# Patient Record
Sex: Male | Born: 1935 | Hispanic: No | Smoking: Never smoker
Health system: Southern US, Community
[De-identification: ages and names within clinical notes are randomized; demographics above are authoritative.]

## PROBLEM LIST (undated history)

## (undated) DIAGNOSIS — Z950 Presence of cardiac pacemaker: Secondary | ICD-10-CM

## (undated) DIAGNOSIS — I739 Peripheral vascular disease, unspecified: Secondary | ICD-10-CM

## (undated) DIAGNOSIS — I509 Heart failure, unspecified: Secondary | ICD-10-CM

## (undated) DIAGNOSIS — I4891 Unspecified atrial fibrillation: Secondary | ICD-10-CM

## (undated) DIAGNOSIS — E785 Hyperlipidemia, unspecified: Secondary | ICD-10-CM

## (undated) DIAGNOSIS — E079 Disorder of thyroid, unspecified: Secondary | ICD-10-CM

## (undated) DIAGNOSIS — K56609 Unspecified intestinal obstruction, unspecified as to partial versus complete obstruction: Secondary | ICD-10-CM

## (undated) HISTORY — DX: Unspecified atrial fibrillation: I48.91

## (undated) HISTORY — PX: COLONOSCOPY: SHX174

## (undated) HISTORY — PX: APPENDECTOMY: SHX54

## (undated) HISTORY — DX: Disorder of thyroid, unspecified: E07.9

## (undated) HISTORY — DX: Peripheral vascular disease, unspecified: I73.9

## (undated) HISTORY — PX: CHOLECYSTECTOMY: SHX55

## (undated) HISTORY — DX: Unspecified intestinal obstruction, unspecified as to partial versus complete obstruction: K56.609

## (undated) HISTORY — DX: Hyperlipidemia, unspecified: E78.5

## (undated) HISTORY — PX: PROSTATE SURGERY: SHX751

---

## 2008-01-05 ENCOUNTER — Ambulatory Visit: Payer: Self-pay | Admitting: Unknown Physician Specialty

## 2008-09-12 ENCOUNTER — Ambulatory Visit: Payer: Self-pay | Admitting: Internal Medicine

## 2008-09-13 HISTORY — PX: INSERT / REPLACE / REMOVE PACEMAKER: SUR710

## 2008-12-20 ENCOUNTER — Ambulatory Visit: Payer: Self-pay | Admitting: Internal Medicine

## 2009-01-04 ENCOUNTER — Ambulatory Visit: Payer: Self-pay | Admitting: Surgery

## 2009-05-20 ENCOUNTER — Emergency Department: Payer: Self-pay | Admitting: Emergency Medicine

## 2010-05-03 ENCOUNTER — Ambulatory Visit: Payer: Self-pay | Admitting: Internal Medicine

## 2011-12-02 ENCOUNTER — Encounter: Payer: Self-pay | Admitting: Cardiovascular Disease

## 2011-12-02 ENCOUNTER — Ambulatory Visit (INDEPENDENT_AMBULATORY_CARE_PROVIDER_SITE_OTHER): Payer: Medicare Other | Admitting: Cardiovascular Disease

## 2011-12-02 VITALS — BP 130/70 | HR 74 | Ht 75.0 in | Wt 168.5 lb

## 2011-12-02 DIAGNOSIS — M79604 Pain in right leg: Secondary | ICD-10-CM | POA: Insufficient documentation

## 2011-12-02 DIAGNOSIS — E785 Hyperlipidemia, unspecified: Secondary | ICD-10-CM

## 2011-12-02 DIAGNOSIS — I495 Sick sinus syndrome: Secondary | ICD-10-CM

## 2011-12-02 DIAGNOSIS — I739 Peripheral vascular disease, unspecified: Secondary | ICD-10-CM

## 2011-12-02 DIAGNOSIS — M79609 Pain in unspecified limb: Secondary | ICD-10-CM

## 2011-12-02 DIAGNOSIS — I4891 Unspecified atrial fibrillation: Secondary | ICD-10-CM | POA: Insufficient documentation

## 2011-12-02 NOTE — Assessment & Plan Note (Signed)
Rate relatively well controlled on metoprolol. No changes to his medications. Pacemaker in place

## 2011-12-02 NOTE — Assessment & Plan Note (Signed)
Details of pacemaker placement or uncertain. Notes indicate pauses, atrial fibrillation. He feels well with no complaints. Pacemaker checked by Dr. Juel Burrow.

## 2011-12-02 NOTE — Patient Instructions (Addendum)
You are doing well.  Try aleve (naproxen) twice a day as  Needed for leg pain Ok to do a trial off crestor Ok to stop neurontin if this is not helping leg pain  Please call us if you have new issues that need to be addressed before your next appt.

## 2011-12-02 NOTE — Assessment & Plan Note (Signed)
Right leg pain likely secondary to orthopedic related issue. We have suggested he try NSAIDs such as Aleve when necessary for severe pain. If symptoms get worse, he could see orthopedics.

## 2011-12-02 NOTE — Progress Notes (Signed)
Patient ID: Johnny Norman, male    DOB: 1925-11-04, 78 y.o.   MRN: [id number]  HPI Comments: Johnny Norman is a very pleasant 78 year old gentleman with history of sick sinus syndrome, pacemaker placed by Dr. Juel Burrow in 2010 with indicating walls is who presents by self referral for right leg pain and possible claudication.  He had ABIs done through outside office showing ABI 1.27 on the right, 1.25 on the left read as normal. Suggestion was made that he had neuropathic pain. He was started on Neurontin 3 times a day with no improvement of his symptoms.  He was referred to Dr. Evette Cristal who felt his pain was orthopedic related. Reports that his pain has waxed and waned over the past year. He describes it as an ache, low-grade, diffuse. He is unable to pinpoint the location. By decreasing the dose of his Crestor, this does not seem to have helped his pain. He does have more pain when he sleeps on his right hip, less pain on the left hip. No significant symptoms with exertion such as walking.  EKG shows atrial fibrillation with right bundle branch block, left anterior fascicular block, rate 86 per minute  When asked about his cardiac history, he denies any significant coronary artery disease. He has never had a stress test or catheterization by his report. He was told by a physician in the past that he may have had a small heart attack but details are unavailable)  He did have a Holter monitor in 2010 that showed periods of tachy arrhythmia. He was started on metoprolol at that time with improvement of his symptoms   Outpatient Encounter Prescriptions as of 12/02/2011  Medication Sig Dispense Refill  . aspirin 81 MG tablet Take 81 mg by mouth daily.      . cholecalciferol (VITAMIN D) 1000 UNITS tablet Take 1,000 Units by mouth daily.      . fish oil-omega-3 fatty acids 1000 MG capsule Take 1 g by mouth 3 (three) times daily.      . Flaxseed, Linseed, (FLAXSEED OIL) 1000 MG CAPS Take 1,000 mg by mouth  daily.      Marland Kitchen gabapentin (NEURONTIN) 100 MG capsule Take 100 mg by mouth 3 (three) times daily.      Marland Kitchen levothyroxine (LEVOTHROID) 88 MCG tablet Take 88 mcg by mouth daily.      . metoprolol succinate (TOPROL-XL) 25 MG 24 hr tablet Take 25 mg by mouth daily.      . mirabegron ER (MYRBETRIQ) 25 MG TB24 Take 25 mg by mouth daily.      . Multiple Vitamin (MULTIVITAMIN) tablet Take 1 tablet by mouth daily.      . rosuvastatin (CRESTOR) 10 MG tablet 5 mg. 5 mg every other day.      . warfarin (COUMADIN) 5 MG tablet Take 5 mg by mouth daily.         Review of Systems  Constitutional: Negative.   HENT: Negative.   Eyes: Negative.   Respiratory: Negative.   Cardiovascular: Negative.   Gastrointestinal: Negative.   Musculoskeletal: Negative.   Skin: Negative.   Neurological: Negative.   Hematological: Negative.   Psychiatric/Behavioral: Negative.   All other systems reviewed and are negative.    BP 130/70  Pulse 74  Ht 6\' 3"  (1.905 m)  Wt 168 lb 8 oz (76.431 kg)  BMI 21.06 kg/m2  Physical Exam  Nursing note and vitals reviewed. Constitutional: He is oriented to person, place, and time. He appears well-developed and  well-nourished.  HENT:  Head: Normocephalic.  Nose: Nose normal.  Mouth/Throat: Oropharynx is clear and moist.  Eyes: Conjunctivae normal are normal. Pupils are equal, round, and reactive to light.  Neck: Normal range of motion. Neck supple. No JVD present.  Cardiovascular: Normal rate, regular rhythm, S1 normal, S2 normal, normal heart sounds and intact distal pulses.  Exam reveals no gallop and no friction rub.   No murmur heard. Pulmonary/Chest: Effort normal and breath sounds normal. No respiratory distress. He has no wheezes. He has no rales. He exhibits no tenderness.  Abdominal: Soft. Bowel sounds are normal. He exhibits no distension. There is no tenderness.  Musculoskeletal: Normal range of motion. He exhibits no edema and no tenderness.  Lymphadenopathy:     He has no cervical adenopathy.  Neurological: He is alert and oriented to person, place, and time. Coordination normal.  Skin: Skin is warm and dry. No rash noted. No erythema.  Psychiatric: He has a normal mood and affect. His behavior is normal. Judgment and thought content normal.           Assessment and Plan

## 2011-12-02 NOTE — Assessment & Plan Note (Signed)
He'll do a trial hold of his Crestor to see if this helps his right leg pain. If no improvement of his symptoms, we will restart the Crestor.

## 2011-12-29 DIAGNOSIS — R972 Elevated prostate specific antigen [PSA]: Secondary | ICD-10-CM | POA: Insufficient documentation

## 2011-12-29 DIAGNOSIS — N62 Hypertrophy of breast: Secondary | ICD-10-CM | POA: Insufficient documentation

## 2011-12-29 DIAGNOSIS — IMO0002 Reserved for concepts with insufficient information to code with codable children: Secondary | ICD-10-CM | POA: Insufficient documentation

## 2011-12-29 DIAGNOSIS — N529 Male erectile dysfunction, unspecified: Secondary | ICD-10-CM | POA: Insufficient documentation

## 2011-12-29 DIAGNOSIS — N138 Other obstructive and reflux uropathy: Secondary | ICD-10-CM | POA: Insufficient documentation

## 2011-12-29 DIAGNOSIS — R350 Frequency of micturition: Secondary | ICD-10-CM | POA: Insufficient documentation

## 2011-12-30 ENCOUNTER — Encounter: Payer: Self-pay | Admitting: Cardiovascular Disease

## 2011-12-30 DIAGNOSIS — R339 Retention of urine, unspecified: Secondary | ICD-10-CM | POA: Insufficient documentation

## 2011-12-31 ENCOUNTER — Telehealth: Payer: Self-pay

## 2011-12-31 NOTE — Telephone Encounter (Signed)
Error

## 2012-02-27 ENCOUNTER — Ambulatory Visit: Payer: Self-pay | Admitting: Chiropractor

## 2012-03-02 ENCOUNTER — Ambulatory Visit: Payer: Self-pay | Admitting: Internal Medicine

## 2012-03-13 ENCOUNTER — Other Ambulatory Visit: Payer: Self-pay

## 2012-09-01 ENCOUNTER — Other Ambulatory Visit: Payer: Self-pay

## 2012-09-07 ENCOUNTER — Encounter: Payer: Self-pay | Admitting: Cardiovascular Disease

## 2012-09-07 ENCOUNTER — Ambulatory Visit (INDEPENDENT_AMBULATORY_CARE_PROVIDER_SITE_OTHER): Payer: Medicare PPO | Admitting: Cardiovascular Disease

## 2012-09-07 VITALS — BP 120/78 | HR 74 | Ht 76.0 in | Wt 168.5 lb

## 2012-09-07 DIAGNOSIS — I495 Sick sinus syndrome: Secondary | ICD-10-CM

## 2012-09-07 DIAGNOSIS — M79609 Pain in unspecified limb: Secondary | ICD-10-CM

## 2012-09-07 DIAGNOSIS — E785 Hyperlipidemia, unspecified: Secondary | ICD-10-CM

## 2012-09-07 DIAGNOSIS — I4891 Unspecified atrial fibrillation: Secondary | ICD-10-CM

## 2012-09-07 DIAGNOSIS — M79604 Pain in right leg: Secondary | ICD-10-CM

## 2012-09-07 NOTE — Assessment & Plan Note (Signed)
Continues to have chronic leg pain, likely orthopedic

## 2012-09-07 NOTE — Progress Notes (Signed)
Patient ID: Johnny Norman, male    DOB: 31-Mar-1925, 79 y.o.   MRN: [id number]  HPI Comments: Johnny Norman is a very pleasant 79 year old gentleman with history of sick sinus syndrome, pacemaker placed by Dr. Juel Burrow in 2010 with indicating walls is who presents for routine followup.  Notes indicate he had recent treadmill stress test which showed no EKG changes concerning for ischemia.  Recent echocardiogram also done by Dr. Harl Bowie showed no significant valvular pathology, good ejection fraction  Overall he reports he doing well. He does have rare episodes of dizziness but none recently. This may have been positional. He continues to have leg aching. He held his Crestor in the past and symptoms did not resolve. Now he takes Lipitor.  Normal ABIs in the past  Previously seen by Dr. Evette Cristal who felt his leg pain was orthopedic related.  EKG shows atrial fibrillation with right bundle branch block, left anterior fascicular block, rate 74 per minute, left axis deviation  He did have a Holter monitor in 2010 that showed periods of tachy arrhythmia. He was started on metoprolol at that time with improvement of his symptoms   Outpatient Encounter Prescriptions as of 09/07/2012  Medication Sig Dispense Refill  . aspirin 81 MG tablet Take 81 mg by mouth daily.      Marland Kitchen atorvastatin (LIPITOR) 10 MG tablet Take 10 mg by mouth daily.      . cholecalciferol (VITAMIN D) 1000 UNITS tablet Take 1,000 Units by mouth daily.      . fish oil-omega-3 fatty acids 1000 MG capsule Take 1 g by mouth 3 (three) times daily.      . Flaxseed, Linseed, (FLAXSEED OIL) 1000 MG CAPS Take 1,000 mg by mouth daily.      Marland Kitchen levothyroxine (LEVOTHROID) 88 MCG tablet Take 88 mcg by mouth daily.      . metoprolol succinate (TOPROL-XL) 25 MG 24 hr tablet Take 25 mg by mouth daily.      . mirabegron ER (MYRBETRIQ) 25 MG TB24 Take 25 mg by mouth daily.      . Multiple Vitamin (MULTIVITAMIN) tablet Take 1 tablet by mouth daily.      Marland Kitchen  warfarin (COUMADIN) 5 MG tablet Take 5 mg by mouth daily.      . [DISCONTINUED] gabapentin (NEURONTIN) 100 MG capsule Take 100 mg by mouth 3 (three) times daily.      . [DISCONTINUED] rosuvastatin (CRESTOR) 10 MG tablet 5 mg. 5 mg every other day.       No facility-administered encounter medications on file as of 09/07/2012.     Review of Systems  Constitutional: Negative.   HENT: Negative.   Eyes: Negative.   Respiratory: Negative.   Cardiovascular: Negative.   Gastrointestinal: Negative.   Musculoskeletal: Positive for myalgias.  Skin: Negative.   Neurological: Negative.   Psychiatric/Behavioral: Negative.   All other systems reviewed and are negative.    BP 120/78  Ht 6\' 4"  (1.93 m)  Wt 168 lb 8 oz (76.431 kg)  BMI 20.52 kg/m2  Physical Exam  Nursing note and vitals reviewed. Constitutional: He is oriented to person, place, and time. He appears well-developed and well-nourished.  HENT:  Head: Normocephalic.  Nose: Nose normal.  Mouth/Throat: Oropharynx is clear and moist.  Eyes: Conjunctivae are normal. Pupils are equal, round, and reactive to light.  Neck: Normal range of motion. Neck supple. No JVD present.  Cardiovascular: Normal rate, regular rhythm, S1 normal, S2 normal, normal heart sounds and intact distal  pulses.  Exam reveals no gallop and no friction rub.   No murmur heard. Pulmonary/Chest: Effort normal and breath sounds normal. No respiratory distress. He has no wheezes. He has no rales. He exhibits no tenderness.  Abdominal: Soft. Bowel sounds are normal. He exhibits no distension. There is no tenderness.  Musculoskeletal: Normal range of motion. He exhibits no edema and no tenderness.  Lymphadenopathy:    He has no cervical adenopathy.  Neurological: He is alert and oriented to person, place, and time. Coordination normal.  Skin: Skin is warm and dry. No rash noted. No erythema.  Psychiatric: He has a normal mood and affect. His behavior is normal.  Judgment and thought content normal.      Assessment and Plan

## 2012-09-07 NOTE — Assessment & Plan Note (Signed)
Encouraged him to stay on his Lipitor 

## 2012-09-07 NOTE — Patient Instructions (Addendum)
You are doing well. No medication changes were made.  Please come in to the office in September for cholesterol check, fasting  Please call us if you have new issues that need to be addressed before your next appt.  Your physician wants you to follow-up in: 12 months.  You will receive a reminder letter in the mail two months in advance. If you don't receive a letter, please call our office to schedule the follow-up appointment.

## 2012-09-07 NOTE — Assessment & Plan Note (Signed)
History of pacemaker placement, followed by Dr. Harl Bowie

## 2012-10-12 ENCOUNTER — Ambulatory Visit (INDEPENDENT_AMBULATORY_CARE_PROVIDER_SITE_OTHER): Payer: Medicare PPO | Admitting: *Deleted

## 2012-10-12 DIAGNOSIS — E785 Hyperlipidemia, unspecified: Secondary | ICD-10-CM

## 2012-10-13 LAB — HEPATIC FUNCTION PANEL
Albumin: 4.2 g/dL (ref 3.5–4.7)
Alkaline Phosphatase: 62 IU/L (ref 39–117)
Bilirubin, Direct: 0.18 mg/dL (ref 0.00–0.40)
Total Protein: 6.2 g/dL (ref 6.0–8.5)

## 2012-10-13 LAB — LIPID PANEL: Chol/HDL Ratio: 2.7 ratio units (ref 0.0–5.0)

## 2012-11-15 ENCOUNTER — Telehealth: Payer: Self-pay

## 2012-11-15 NOTE — Telephone Encounter (Signed)
Message copied by Marilynne Halsted on Mon Nov 15, 2012  3:58 PM ------      Message from: Antonieta Iba      Created: Sun Nov 14, 2012  6:48 PM       Cholesterol good, 159      Stay on current meds ------

## 2012-11-15 NOTE — Telephone Encounter (Signed)
Spoke w/ pt.  He is aware of results.  

## 2012-12-02 ENCOUNTER — Other Ambulatory Visit: Payer: Self-pay

## 2013-09-07 ENCOUNTER — Ambulatory Visit (INDEPENDENT_AMBULATORY_CARE_PROVIDER_SITE_OTHER): Payer: 59 | Admitting: Cardiovascular Disease

## 2013-09-07 ENCOUNTER — Encounter: Payer: Self-pay | Admitting: Cardiovascular Disease

## 2013-09-07 VITALS — BP 110/82 | HR 75 | Ht 75.0 in | Wt 170.0 lb

## 2013-09-07 DIAGNOSIS — E785 Hyperlipidemia, unspecified: Secondary | ICD-10-CM

## 2013-09-07 DIAGNOSIS — R609 Edema, unspecified: Secondary | ICD-10-CM

## 2013-09-07 DIAGNOSIS — R269 Unspecified abnormalities of gait and mobility: Secondary | ICD-10-CM

## 2013-09-07 DIAGNOSIS — I4891 Unspecified atrial fibrillation: Secondary | ICD-10-CM

## 2013-09-07 DIAGNOSIS — I495 Sick sinus syndrome: Secondary | ICD-10-CM

## 2013-09-07 DIAGNOSIS — R2681 Unsteadiness on feet: Secondary | ICD-10-CM

## 2013-09-07 DIAGNOSIS — R600 Localized edema: Secondary | ICD-10-CM | POA: Insufficient documentation

## 2013-09-07 NOTE — Assessment & Plan Note (Signed)
Tolerating anticoagulation. Heart rate well controlled on nadolol

## 2013-09-07 NOTE — Assessment & Plan Note (Signed)
Reports he is staggering in the morning. This happens at 5 AM when he does his walking. Less likely cardiac but we suggested he hold his blocker until he returns home. He could even try one half dose of his beta blocker in the morning, one half at nighttime. Using blood pressure cuff to monitor his blood pressure to watch for orthostasis.

## 2013-09-07 NOTE — Assessment & Plan Note (Signed)
Pacemaker placed, managed by Dr. Harl BowieMassoud

## 2013-09-07 NOTE — Assessment & Plan Note (Signed)
He reports leg edema is better with compression hose. Less likely heart failure. More likely from venous insufficiency. No need for diuretics at this time as symptoms were not impressive today. Recommended he use compression hose as needed, leg elevation. Call our office if symptoms get worse

## 2013-09-07 NOTE — Progress Notes (Signed)
Patient ID: Johnny Norman, male    DOB: 1925/12/04, 80 y.o.   MRN: [id number]  HPI Comments: [name] is a very pleasant 80 year old gentleman with history of sick sinus syndrome, atrial fibrillation, pacemaker placed by Dr. Juel BurrowMasoud in 2010 with indicating walls is who presents for routine followup.  In followup today, he reports that he is exercising on a daily basis 20 or 30 minutes walking 5 days per week. Overall feels well. He has noticed some trace lower extremity edema and has started wearing compression hose. Denies having any shortness of breath or chest pain with exertion. He reports lower extremity edema is better today, with his compression hose  He is concerned about decreased energy, staggering in the morning when he gets up at 5 AM to go walking. Also was reading on the Internet and worried about heart failure  03/14/2013 lab work showing total cholesterol 148, LDL 73, HDL 49  Previous treadmill stress test which showed no EKG changes concerning for ischemia.  Recent echocardiogram also done by Dr. Harl BowieMassoud showed no significant valvular pathology, good ejection fraction  Normal ABIs in the past Previously seen by Dr. Evette CristalSankar who felt his leg pain was orthopedic related.  EKG shows paced rhythm with underlying atrial fibrillation with right bundle branch block, left anterior fascicular block, rate 75 per minute  He did have a Holter monitor in 2010 that showed periods of tachy arrhythmia. He was started on metoprolol at that time with improvement of his symptoms   Outpatient Encounter Prescriptions as of 09/07/2013  Medication Sig  . aspirin 81 MG tablet Take 81 mg by mouth daily.  Marland Kitchen. atorvastatin (LIPITOR) 10 MG tablet Take 10 mg by mouth daily.  . cholecalciferol (VITAMIN D) 1000 UNITS tablet Take 1,000 Units by mouth daily.  . fish oil-omega-3 fatty acids 1000 MG capsule Take 1 g by mouth 3 (three) times daily.  . Flaxseed, Linseed, (FLAXSEED OIL) 1000 MG CAPS Take 1,000  mg by mouth daily.  Marland Kitchen. levothyroxine (LEVOTHROID) 88 MCG tablet Take 88 mcg by mouth daily.  . mirabegron ER (MYRBETRIQ) 25 MG TB24 Take 25 mg by mouth daily.  . Multiple Vitamin (MULTIVITAMIN) tablet Take 1 tablet by mouth daily.  . nadolol (CORGARD) 20 MG tablet Take 20 mg by mouth daily.   Marland Kitchen. warfarin (COUMADIN) 5 MG tablet Take 5 mg by mouth daily.    Review of Systems  Constitutional: Negative.   HENT: Negative.   Eyes: Negative.   Respiratory: Negative.   Cardiovascular: Negative.   Gastrointestinal: Negative.   Endocrine: Negative.   Musculoskeletal: Positive for myalgias.  Skin: Negative.   Allergic/Immunologic: Negative.   Neurological: Negative.   Hematological: Negative.   Psychiatric/Behavioral: Negative.   All other systems reviewed and are negative.   BP 110/82  Pulse 75  Ht 6\' 3"  (1.905 m)  Wt 170 lb (77.111 kg)  BMI 21.25 kg/m2  Physical Exam  Nursing note and vitals reviewed. Constitutional: He is oriented to person, place, and time. He appears well-developed and well-nourished.  HENT:  Head: Normocephalic.  Nose: Nose normal.  Mouth/Throat: Oropharynx is clear and moist.  Eyes: Conjunctivae are normal. Pupils are equal, round, and reactive to light.  Neck: Normal range of motion. Neck supple. No JVD present.  Cardiovascular: Normal rate, regular rhythm, S1 normal, S2 normal, normal heart sounds and intact distal pulses.  Exam reveals no gallop and no friction rub.   No murmur heard. Pulmonary/Chest: Effort normal and breath sounds normal. No  respiratory distress. He has no wheezes. He has no rales. He exhibits no tenderness.  Abdominal: Soft. Bowel sounds are normal. He exhibits no distension. There is no tenderness.  Musculoskeletal: Normal range of motion. He exhibits no edema and no tenderness.  Lymphadenopathy:    He has no cervical adenopathy.  Neurological: He is alert and oriented to person, place, and time. Coordination normal.  Skin: Skin is  warm and dry. No rash noted. No erythema.  Psychiatric: He has a normal mood and affect. His behavior is normal. Judgment and thought content normal.      Assessment and Plan

## 2013-09-07 NOTE — Patient Instructions (Signed)
You are doing well. No medication changes were made.  Please call us if you have new issues that need to be addressed before your next appt.  Your physician wants you to follow-up in: 12 months.  You will receive a reminder letter in the mail two months in advance. If you don't receive a letter, please call our office to schedule the follow-up appointment. 

## 2013-09-07 NOTE — Assessment & Plan Note (Signed)
Cholesterol is at goal on the current lipid regimen. No changes to the medications were made.  

## 2014-04-25 ENCOUNTER — Ambulatory Visit (INDEPENDENT_AMBULATORY_CARE_PROVIDER_SITE_OTHER): Payer: Medicare Other | Admitting: Podiatry

## 2014-04-25 ENCOUNTER — Encounter: Payer: Self-pay | Admitting: Podiatry

## 2014-04-25 VITALS — BP 126/84 | HR 86 | Resp 16

## 2014-04-25 DIAGNOSIS — L600 Ingrowing nail: Secondary | ICD-10-CM | POA: Diagnosis not present

## 2014-04-25 MED ORDER — CEPHALEXIN 500 MG PO CAPS: 500.0000 mg | ORAL_CAPSULE | Freq: Two times a day (BID) | ORAL | Status: DC

## 2014-04-25 NOTE — Progress Notes (Signed)
   Subjective:    Patient ID: Johnny Norman, male    DOB: 1925-06-14, 81 y.o.   MRN: [id number]  HPI Comments: "I have an ingrown toenail"  Patient c/o tender 1st toe right, medial border, for couple months. The area is slightly red and swollen. He did see his PCP and was advised to use an "iodine paint" on it. It does looks better today.   Toe Pain       Review of Systems  Genitourinary: Positive for difficulty urinating.  Musculoskeletal: Positive for myalgias and arthralgias.  All other systems reviewed and are negative.      Objective:   Physical Exam AAO x3, NAD DP/PT pulses palpable bilaterally, CRT less than 3 seconds Protective sensation intact with Simms Weinstein monofilament, vibratory sensation slight decreased, Achilles tendon reflex intact. On the medial aspect of the right hallux there is evidence of incurvation of the nail border tenderness palpation around the nail border. There is erythema to the medial aspect of the hallux.  There is no ascending cellulitis, fluctuance, crepitus. There is no drainage or purulence expressed at this time. However, see procedure note below.There is no tenderness on the lateral nail border. Remaining nails also with slight incurvation  However with no tenderness at this time. No open lesions or pre-ulcerative lesions are identified. No pain with calf compression, swelling, warmth, erythema.        Assessment & Plan:   81 year old male right medial hallux  Ingrown toenail with localized infection - treatment options both conservative and surgical were discussed the patient including alternatives, risks, complications. - at this time as the patient on Coumadin I performed a slant back procedure to help alleviate the patient's symptoms. At the conclusion of the procedure he did not have any resolution of symptoms and there was a small amount of purulence expressed of the nail border. Because of this recommend a partial nail  avulsion to help of infection. Patient understands risks and verbally consents. Under  Sterile conditions a total of 3 mL of a mixture of 2% lidocaine plain and 0.5% Marcaine plain was infiltrated in a hallux block fashion. Skin was then prepped in sterile fashion. Tourniquet was applied. Medial border the right hallux nail was sharply excised making sure to remove the entire offending nail border. Once the nail was removed the underlying skin was debrided and found to be intact. No further purulence was expressed. Area was irrigated. Silvadene was applied followed by dry sterile dressing.  After the dressings were applied and tourniquet was removed and there is found to be an immediate capillary refill time to the digit.  The patient was set for approximately 10 minutes after the procedure to ensure there is no bleeding for which there was not. Patient tolerated the procedure well any complications. Post procedure care was discussed the patient for which she verbally understood. Prescribed Keflex. Monitor for any signs or symptoms of worsening infection and directed to call the office immediately should any occur or go directly to the emergency room. Follow up in 1 week for nail check or sooner if any problems are to arise. Follow-up with PCP for other issues mentioned and review of systems.

## 2014-04-25 NOTE — Patient Instructions (Signed)

## 2014-05-02 ENCOUNTER — Ambulatory Visit (INDEPENDENT_AMBULATORY_CARE_PROVIDER_SITE_OTHER): Payer: Medicare Other | Admitting: Podiatry

## 2014-05-02 DIAGNOSIS — L600 Ingrowing nail: Secondary | ICD-10-CM

## 2014-05-02 NOTE — Patient Instructions (Signed)

## 2014-05-03 NOTE — Progress Notes (Signed)
Patient ID: Johnny FlakesRichard E Norman, male   DOB: [birth year], 81 y.o.   MRN: [id number]  Subjective: 81 year old male returns the office today one-week status post medial hallux partial nail avulsion secondary to infection. He states his been continuous of this with Epson salts cover with antibiotic ointment and a Band-Aid. Denies any pain associated with the area. He states that the redness is decreasing denies any red streaking. Denies any drainage or purulence. He's continuing the antibiotics. He is asking of his left big toenail can be trimmed as his ingrown. Denies any drainage or redness from the site and no specific tenderness. No other complaints at this time.  Objective: AAO 3, NAD DP/PT pulses palpable, CRT less than 3 seconds Status post medial hallux partial nail avulsion which is healing well. There is a small scab formation within the procedure site however there is no tenderness to palpation. There is no swelling erythema, ascending cellulitis. No drainage/purulence. On the left hallux nail there is evidence of incurvation of both the medial and lateral aspect of the nail border. There is no specific tenderness palpation overlying the nail borders there is no swelling erythema or drainage. No other open lesions or pre-ulcerative lesions No pain with calf compression, swelling, warmth, erythema.  Assessment: 81 year old male 1 week status post right hallux partial nail avulsion secondary to infection, resolving; left hallux ingrown toenail  Plan: -At this time recommended continue soaking in Epson salt soaks twice a day covering with antibiotic ointment and a Band-Aid during the day. Can leave the area uncovered at night. Continue this until the area has completely healed. Follow-up in 2 weeks of the area is not completely healed or sooner if there is any problems.  -Left hallux nail sharply debrided without complication/bleeding. -Monitor for any signs or symptoms of recurrence of infection  and directed to call the office if any are to occur.

## 2014-10-05 ENCOUNTER — Ambulatory Visit (INDEPENDENT_AMBULATORY_CARE_PROVIDER_SITE_OTHER): Payer: Medicare Other | Admitting: Cardiovascular Disease

## 2014-10-05 ENCOUNTER — Encounter: Payer: Self-pay | Admitting: Cardiovascular Disease

## 2014-10-05 VITALS — BP 110/80 | HR 74 | Ht 75.0 in | Wt 168.8 lb

## 2014-10-05 DIAGNOSIS — Z7189 Other specified counseling: Secondary | ICD-10-CM | POA: Diagnosis not present

## 2014-10-05 DIAGNOSIS — I495 Sick sinus syndrome: Secondary | ICD-10-CM

## 2014-10-05 DIAGNOSIS — R002 Palpitations: Secondary | ICD-10-CM | POA: Insufficient documentation

## 2014-10-05 DIAGNOSIS — I4891 Unspecified atrial fibrillation: Secondary | ICD-10-CM

## 2014-10-05 DIAGNOSIS — R600 Localized edema: Secondary | ICD-10-CM | POA: Diagnosis not present

## 2014-10-05 DIAGNOSIS — E785 Hyperlipidemia, unspecified: Secondary | ICD-10-CM | POA: Diagnosis not present

## 2014-10-05 DIAGNOSIS — Z7901 Long term (current) use of anticoagulants: Secondary | ICD-10-CM | POA: Insufficient documentation

## 2014-10-05 NOTE — Assessment & Plan Note (Signed)
Etiology of his several minute episode of palpitations is unclear If he has additional episodes, would order a 30 day monitor if nothing notable on pacemaker check

## 2014-10-05 NOTE — Assessment & Plan Note (Signed)
No significant edema on today's visit No changes to his medications

## 2014-10-05 NOTE — Progress Notes (Signed)
Patient ID: Johnny Norman, male    DOB: September 12, 1925, 81 y.o.   MRN: [id number]  HPI Comments: [name] is a very pleasant 81 year old gentleman with history of sick sinus syndrome, atrial fibrillation, pacemaker placed by Dr. Juel Burrow in 2010 with indicating walls is who presents for routine followup for atrial fibrillation.  Recent episode of strong beats at night, 9 to 10 pm.  2 to 3 minutes  Otherwise feels well.  Dr. Juel Burrow checks pacers  In followup today, he reports that he is exercising on a daily basis 20 or 30 minutes walking 5 days per week.  Overall feels well.   03/14/2013 lab work showing total cholesterol 148, LDL 73, HDL 49 Labs 04/2014: total chol  148, ldl 82  EKG shows NSR with rate 74,   Other medical history Previous treadmill stress test which showed no EKG changes concerning for ischemia.  Recent echocardiogram also done by Dr. Harl Bowie showed no significant valvular pathology, good ejection fraction  Normal ABIs in the past Previously seen by Dr. Evette Cristal who felt his leg pain was orthopedic related.  Holter monitor in 2010 that showed periods of tachy arrhythmia. He was started on metoprolol at that time with improvement of his symptoms  No Known Allergies  Current Outpatient Prescriptions on File Prior to Visit  Medication Sig Dispense Refill  . aspirin 81 MG tablet Take 81 mg by mouth daily.    Marland Kitchen atorvastatin (LIPITOR) 10 MG tablet Take 10 mg by mouth daily.    . cholecalciferol (VITAMIN D) 1000 UNITS tablet Take 1,000 Units by mouth daily.    . fish oil-omega-3 fatty acids 1000 MG capsule Take 1 g by mouth 3 (three) times daily.    . Flaxseed, Linseed, (FLAXSEED OIL) 1000 MG CAPS Take 1,000 mg by mouth daily.    Marland Kitchen levothyroxine (LEVOTHROID) 88 MCG tablet Take 88 mcg by mouth daily.    . mirabegron ER (MYRBETRIQ) 25 MG TB24 Take 25 mg by mouth daily.    . Multiple Vitamin (MULTIVITAMIN) tablet Take 1 tablet by mouth daily.    . nadolol (CORGARD) 20 MG  tablet Take 20 mg by mouth daily.     Marland Kitchen warfarin (COUMADIN) 5 MG tablet Take 5 mg by mouth daily.     No current facility-administered medications on file prior to visit.    Past Medical History  Diagnosis Date  . Claudication     leg  . A-fib   . Thyroid disease   . Hyperlipidemia     Past Surgical History  Procedure Laterality Date  . Insert / replace / remove pacemaker  09/13/2008    SSS s/p pacemaker implant; Medtronic pacemaker,.  . Colonoscopy    . Cholecystectomy    . Appendectomy    . Prostate surgery      Social History  reports that he has never smoked. He does not have any smokeless tobacco history on file. He reports that he does not drink alcohol or use illicit drugs.  Family History Family history is unknown by patient.   Review of Systems  Constitutional: Negative.   HENT: Negative.   Eyes: Negative.   Respiratory: Negative.   Cardiovascular: Negative.   Gastrointestinal: Negative.   Endocrine: Negative.   Musculoskeletal: Positive for myalgias.  Skin: Negative.   Allergic/Immunologic: Negative.   Neurological: Negative.   Hematological: Negative.   Psychiatric/Behavioral: Negative.   All other systems reviewed and are negative.   BP 110/80 mmHg  Pulse 74  Ht   (1.905 m)  Wt 168 lb 12 oz (76.544 kg)  BMI 21.09 kg/m2  Physical Exam  Constitutional: He is oriented to person, place, and time. He appears well-developed and well-nourished.  HENT:  Head: Normocephalic.  Nose: Nose normal.  Mouth/Throat: Oropharynx is clear and moist.  Eyes: Conjunctivae are normal. Pupils are equal, round, and reactive to light.  Neck: Normal range of motion. Neck supple. No JVD present.  Cardiovascular: Normal rate, regular rhythm, S1 normal, S2 normal, normal heart sounds and intact distal pulses.  Exam reveals no gallop and no friction rub.   No murmur heard. Pulmonary/Chest: Effort normal and breath sounds normal. No respiratory distress. He has no  wheezes. He has no rales. He exhibits no tenderness.  Abdominal: Soft. Bowel sounds are normal. He exhibits no distension. There is no tenderness.  Musculoskeletal: Normal range of motion. He exhibits no edema or tenderness.  Lymphadenopathy:    He has no cervical adenopathy.  Neurological: He is alert and oriented to person, place, and time. Coordination normal.  Skin: Skin is warm and dry. No rash noted. No erythema.  Psychiatric: He has a normal mood and affect. His behavior is normal. Judgment and thought content normal.      Assessment and Plan   Nursing note and vitals reviewed.

## 2014-10-05 NOTE — Assessment & Plan Note (Signed)
He has a pacemaker, followed by Dr. Harl Bowie No recent issues Suggested he talk with him about recent palpitations. If he has more episodes, could order a 30 day monitor

## 2014-10-05 NOTE — Assessment & Plan Note (Signed)
Cholesterol is at goal on the current lipid regimen. No changes to the medications were made.  

## 2014-10-05 NOTE — Patient Instructions (Signed)
You are doing well. No medication changes were made.  Please call us if you have new issues that need to be addressed before your next appt.  Your physician wants you to follow-up in: 12 months.  You will receive a reminder letter in the mail two months in advance. If you don't receive a letter, please call our office to schedule the follow-up appointment. 

## 2014-10-05 NOTE — Assessment & Plan Note (Signed)
He has converted to NSR since his last clinic visit Recommended he stay on anticoagulation and beta blocker

## 2014-10-05 NOTE — Assessment & Plan Note (Signed)
Doing well on his anticoagulation, no recent falls

## 2014-10-07 ENCOUNTER — Inpatient Hospital Stay
Admission: EM | Admit: 2014-10-07 | Discharge: 2014-10-18 | DRG: 330 | Disposition: A | Discharge status: Skilled Nursing Facility | Payer: Medicare Other | Attending: Surgery | Admitting: Surgery

## 2014-10-07 ENCOUNTER — Emergency Department: Payer: Medicare Other

## 2014-10-07 ENCOUNTER — Encounter: Payer: Self-pay | Admitting: *Deleted

## 2014-10-07 DIAGNOSIS — E785 Hyperlipidemia, unspecified: Secondary | ICD-10-CM | POA: Diagnosis present

## 2014-10-07 DIAGNOSIS — Z7901 Long term (current) use of anticoagulants: Secondary | ICD-10-CM

## 2014-10-07 DIAGNOSIS — R338 Other retention of urine: Secondary | ICD-10-CM | POA: Diagnosis present

## 2014-10-07 DIAGNOSIS — Z950 Presence of cardiac pacemaker: Secondary | ICD-10-CM

## 2014-10-07 DIAGNOSIS — Z820 Family history of epilepsy and other diseases of the nervous system: Secondary | ICD-10-CM

## 2014-10-07 DIAGNOSIS — E872 Acidosis: Secondary | ICD-10-CM | POA: Diagnosis present

## 2014-10-07 DIAGNOSIS — Z7982 Long term (current) use of aspirin: Secondary | ICD-10-CM

## 2014-10-07 DIAGNOSIS — Z4659 Encounter for fitting and adjustment of other gastrointestinal appliance and device: Secondary | ICD-10-CM

## 2014-10-07 DIAGNOSIS — E079 Disorder of thyroid, unspecified: Secondary | ICD-10-CM | POA: Diagnosis present

## 2014-10-07 DIAGNOSIS — K551 Chronic vascular disorders of intestine: Secondary | ICD-10-CM | POA: Diagnosis present

## 2014-10-07 DIAGNOSIS — I10 Essential (primary) hypertension: Secondary | ICD-10-CM | POA: Diagnosis present

## 2014-10-07 DIAGNOSIS — I509 Heart failure, unspecified: Secondary | ICD-10-CM | POA: Diagnosis present

## 2014-10-07 DIAGNOSIS — I440 Atrioventricular block, first degree: Secondary | ICD-10-CM | POA: Diagnosis present

## 2014-10-07 DIAGNOSIS — D72829 Elevated white blood cell count, unspecified: Secondary | ICD-10-CM | POA: Diagnosis present

## 2014-10-07 DIAGNOSIS — K565 Intestinal adhesions [bands], unspecified as to partial versus complete obstruction: Secondary | ICD-10-CM

## 2014-10-07 DIAGNOSIS — N401 Enlarged prostate with lower urinary tract symptoms: Secondary | ICD-10-CM | POA: Diagnosis present

## 2014-10-07 DIAGNOSIS — R110 Nausea: Secondary | ICD-10-CM

## 2014-10-07 DIAGNOSIS — I4891 Unspecified atrial fibrillation: Secondary | ICD-10-CM | POA: Diagnosis present

## 2014-10-07 DIAGNOSIS — K5669 Other intestinal obstruction: Secondary | ICD-10-CM | POA: Diagnosis present

## 2014-10-07 DIAGNOSIS — K56609 Unspecified intestinal obstruction, unspecified as to partial versus complete obstruction: Secondary | ICD-10-CM

## 2014-10-07 HISTORY — DX: Heart failure, unspecified: I50.9

## 2014-10-07 LAB — COMPREHENSIVE METABOLIC PANEL
ALBUMIN: 4.3 g/dL (ref 3.5–5.0)
ALK PHOS: 71 U/L (ref 38–126)
ALT: 21 U/L (ref 17–63)
ANION GAP: 6 (ref 5–15)
AST: 34 U/L (ref 15–41)
BILIRUBIN TOTAL: 1.3 mg/dL — AB (ref 0.3–1.2)
BUN: 20 mg/dL (ref 6–20)
CALCIUM: 9.4 mg/dL (ref 8.9–10.3)
CO2: 27 mmol/L (ref 22–32)
Chloride: 105 mmol/L (ref 101–111)
Creatinine, Ser: 1.23 mg/dL (ref 0.61–1.24)
GFR, EST AFRICAN AMERICAN: 59 mL/min — AB (ref >60)
GFR, EST NON AFRICAN AMERICAN: 51 mL/min — AB (ref >60)
GLUCOSE: 128 mg/dL — AB (ref 65–99)
Potassium: 4.1 mmol/L (ref 3.5–5.1)
Sodium: 138 mmol/L (ref 135–145)
TOTAL PROTEIN: 7 g/dL (ref 6.5–8.1)

## 2014-10-07 LAB — URINALYSIS COMPLETE WITH MICROSCOPIC (ARMC ONLY)
Bacteria, UA: NONE SEEN
Bilirubin Urine: NEGATIVE
GLUCOSE, UA: NEGATIVE mg/dL
Hgb urine dipstick: NEGATIVE
KETONES UR: NEGATIVE mg/dL
LEUKOCYTES UA: NEGATIVE
NITRITE: NEGATIVE
PROTEIN: NEGATIVE mg/dL
SPECIFIC GRAVITY, URINE: 1.019 (ref 1.005–1.030)
Squamous Epithelial / LPF: NONE SEEN
WBC UA: NONE SEEN WBC/hpf (ref 0–5)
pH: 8 (ref 5.0–8.0)

## 2014-10-07 LAB — CBC
HCT: 44.6 % (ref 40.0–52.0)
HEMOGLOBIN: 15.1 g/dL (ref 13.0–18.0)
MCH: 30.4 pg (ref 26.0–34.0)
MCHC: 33.9 g/dL (ref 32.0–36.0)
MCV: 89.9 fL (ref 80.0–100.0)
Platelets: 187 10*3/uL (ref 150–440)
RBC: 4.96 MIL/uL (ref 4.40–5.90)
RDW: 13.6 % (ref 11.5–14.5)
WBC: 8.1 10*3/uL (ref 3.8–10.6)

## 2014-10-07 LAB — PROTIME-INR
INR: 2.27
Prothrombin Time: 25.2 seconds — ABNORMAL HIGH (ref 11.4–15.0)

## 2014-10-07 LAB — LACTIC ACID, PLASMA: Lactic Acid, Venous: 2.5 mmol/L (ref 0.5–2.0)

## 2014-10-07 LAB — TROPONIN I: Troponin I: <0.03 ng/mL (ref <0.031)

## 2014-10-07 LAB — LIPASE, BLOOD: Lipase: 29 U/L (ref 22–51)

## 2014-10-07 MED ORDER — BUTAMBEN-TETRACAINE-BENZOCAINE 2-2-14 % EX AERO
1.0000 | INHALATION_SPRAY | Freq: Once | CUTANEOUS | Status: AC
  Administered 2014-10-07: 1 via TOPICAL

## 2014-10-07 MED ORDER — MENTHOL 3 MG MT LOZG
1.0000 | LOZENGE | OROMUCOSAL | Status: DC | PRN
  Administered 2014-10-08 – 2014-10-09 (×2): 3 mg via ORAL
  Filled 2014-10-07 (×3): qty 9

## 2014-10-07 MED ORDER — SODIUM CHLORIDE 0.9 % IV SOLN
INTRAVENOUS | Status: DC
  Administered 2014-10-08: 03:00:00 via INTRAVENOUS

## 2014-10-07 MED ORDER — ONDANSETRON 4 MG PO TBDP
ORAL_TABLET | ORAL | Status: AC
  Filled 2014-10-07: qty 1

## 2014-10-07 MED ORDER — ASPIRIN EC 81 MG PO TBEC
81.0000 mg | DELAYED_RELEASE_TABLET | Freq: Every day | ORAL | Status: DC
  Administered 2014-10-07 – 2014-10-10 (×4): 81 mg via ORAL
  Filled 2014-10-07 (×4): qty 1

## 2014-10-07 MED ORDER — ACETAMINOPHEN 650 MG RE SUPP
650.0000 mg | Freq: Four times a day (QID) | RECTAL | Status: DC | PRN
  Administered 2014-10-12: 650 mg via RECTAL
  Filled 2014-10-07: qty 1

## 2014-10-07 MED ORDER — LEVOTHYROXINE SODIUM 88 MCG PO TABS
88.0000 ug | ORAL_TABLET | Freq: Every day | ORAL | Status: DC
  Administered 2014-10-08 – 2014-10-10 (×3): 88 ug via ORAL
  Filled 2014-10-07 (×3): qty 1

## 2014-10-07 MED ORDER — DEXTROSE IN LACTATED RINGERS 5 % IV SOLN
INTRAVENOUS | Status: DC
  Administered 2014-10-07: 18:00:00 via INTRAVENOUS

## 2014-10-07 MED ORDER — MORPHINE SULFATE (PF) 2 MG/ML IV SOLN
2.0000 mg | INTRAVENOUS | Status: DC | PRN
  Administered 2014-10-07 (×3): 2 mg via INTRAVENOUS
  Filled 2014-10-07 (×4): qty 1

## 2014-10-07 MED ORDER — BENZOCAINE (TOPICAL) 20 % EX AERO
INHALATION_SPRAY | Freq: Four times a day (QID) | CUTANEOUS | Status: DC | PRN
  Filled 2014-10-07: qty 57

## 2014-10-07 MED ORDER — HYDROMORPHONE HCL 1 MG/ML IJ SOLN
0.5000 mg | INTRAMUSCULAR | Status: DC | PRN
  Administered 2014-10-07 – 2014-10-08 (×4): 0.5 mg via INTRAVENOUS
  Filled 2014-10-07 (×4): qty 1

## 2014-10-07 MED ORDER — ACETAMINOPHEN 325 MG PO TABS: 650.0000 mg | ORAL_TABLET | Freq: Four times a day (QID) | ORAL | Status: DC | PRN

## 2014-10-07 MED ORDER — SODIUM CHLORIDE 0.9 % IV BOLUS (SEPSIS)
500.0000 mL | Freq: Once | INTRAVENOUS | Status: AC
  Administered 2014-10-07: 500 mL via INTRAVENOUS

## 2014-10-07 MED ORDER — IOHEXOL 300 MG/ML  SOLN
100.0000 mL | Freq: Once | INTRAMUSCULAR | Status: AC | PRN
  Administered 2014-10-07: 100 mL via INTRAVENOUS

## 2014-10-07 MED ORDER — IOHEXOL 240 MG/ML SOLN
25.0000 mL | Freq: Once | INTRAMUSCULAR | Status: AC | PRN
  Administered 2014-10-07: 25 mL via ORAL

## 2014-10-07 MED ORDER — ATORVASTATIN CALCIUM 20 MG PO TABS
10.0000 mg | ORAL_TABLET | Freq: Every day | ORAL | Status: DC
  Administered 2014-10-07 – 2014-10-10 (×4): 10 mg via ORAL
  Filled 2014-10-07 (×4): qty 1

## 2014-10-07 MED ORDER — ENOXAPARIN SODIUM 30 MG/0.3ML ~~LOC~~ SOLN
30.0000 mg | SUBCUTANEOUS | Status: DC
  Administered 2014-10-07: 30 mg via SUBCUTANEOUS
  Filled 2014-10-07: qty 0.3

## 2014-10-07 MED ORDER — BUTAMBEN-TETRACAINE-BENZOCAINE 2-2-14 % EX AERO
INHALATION_SPRAY | CUTANEOUS | Status: AC
  Administered 2014-10-07: 1 via TOPICAL
  Filled 2014-10-07: qty 20

## 2014-10-07 MED ORDER — PROMETHAZINE HCL 25 MG PO TABS
12.5000 mg | ORAL_TABLET | Freq: Four times a day (QID) | ORAL | Status: DC | PRN
  Administered 2014-10-07 – 2014-10-09 (×4): 12.5 mg via ORAL
  Filled 2014-10-07 (×4): qty 1

## 2014-10-07 MED ORDER — MIRABEGRON ER 25 MG PO TB24
25.0000 mg | ORAL_TABLET | Freq: Every day | ORAL | Status: DC
  Administered 2014-10-08 – 2014-10-10 (×3): 25 mg via ORAL
  Filled 2014-10-07 (×6): qty 1

## 2014-10-07 MED ORDER — ONDANSETRON HCL 4 MG/2ML IJ SOLN
4.0000 mg | Freq: Once | INTRAMUSCULAR | Status: AC
  Administered 2014-10-07: 4 mg via INTRAVENOUS
  Filled 2014-10-07: qty 2

## 2014-10-07 MED ORDER — ONDANSETRON 4 MG PO TBDP
4.0000 mg | ORAL_TABLET | Freq: Once | ORAL | Status: AC | PRN
  Administered 2014-10-07: 4 mg via ORAL

## 2014-10-07 MED ORDER — NADOLOL 20 MG PO TABS
20.0000 mg | ORAL_TABLET | Freq: Every day | ORAL | Status: DC
  Administered 2014-10-07 – 2014-10-10 (×4): 20 mg via ORAL
  Filled 2014-10-07 (×4): qty 1

## 2014-10-07 NOTE — H&P (Signed)
Johnny Norman is an 81 y.o. male.    Chief Complaint:   abdominal pain and vomiting.  HPI:   This is an active 81 year old male with a history of atrial fibrillation and sick sinus syndrome for which she is been placed on Coumadin and who is had multiple previous abdominal operations including an appendectomy in the distant past as well as a cholecystectomy done laparoscopically. He awoke this morning at approximately 2:30 in the morning with rather vague diffuse abdominal pain with mild distention followed by some intermittent emesis and nausea. He came to the emergency room because he persisted with abdominal pain and workup is concerning for adhesive small bowel obstruction. CT scan was performed demonstrating findings consistent with small bowel obstruction and as such surgical services were asked to admit.  The patient denies any prior history of bowel obstruction or bowel obstruction surgery. No problems eating. Last bowel movement was 2 days ago small amount of flatus today. As a gastric tube was placed by nursing staff and KUB confirms it to be within the stomach this point in time. He follows with Dr.Gollan of cardiology.  Past Medical History  Diagnosis Date  . Claudication     leg  . A-fib   . Thyroid disease   . Hyperlipidemia   . CHF (congestive heart failure)     Past Surgical History  Procedure Laterality Date  . Insert / replace / remove pacemaker  09/13/2008    SSS s/p pacemaker implant; Medtronic pacemaker,.  . Colonoscopy    . Cholecystectomy    . Appendectomy    . Prostate surgery      Family History  Problem Relation Age of Onset  . Family history unknown: Yes   Social History:  reports that he has never smoked. He does not have any smokeless tobacco history on file. He reports that he does not drink alcohol or use illicit drugs.  Allergies: No Known Allergies  Results for orders placed or performed during the hospital encounter of 10/07/14 (from the  past 48 hour(s))  Lipase, blood     Status: None   Collection Time: 10/07/14  7:03 AM  Result Value Ref Range   Lipase 29 22 - 51 U/L  Comprehensive metabolic panel     Status: Abnormal   Collection Time: 10/07/14  7:03 AM  Result Value Ref Range   Sodium 138 135 - 145 mmol/L   Potassium 4.1 3.5 - 5.1 mmol/L   Chloride 105 101 - 111 mmol/L   CO2 27 22 - 32 mmol/L   Glucose, Bld 128 (H) 65 - 99 mg/dL   BUN 20 6 - 20 mg/dL   Creatinine, Ser 1.23 0.61 - 1.24 mg/dL   Calcium 9.4 8.9 - 10.3 mg/dL   Total Protein 7.0 6.5 - 8.1 g/dL   Albumin 4.3 3.5 - 5.0 g/dL   AST 34 15 - 41 U/L   ALT 21 17 - 63 U/L   Alkaline Phosphatase 71 38 - 126 U/L   Total Bilirubin 1.3 (H) 0.3 - 1.2 mg/dL   GFR calc non Af Amer 51 (L) >60 mL/min   GFR calc Af Amer 59 (L) >60 mL/min    Comment: (NOTE) The eGFR has been calculated using the CKD EPI equation. This calculation has not been validated in all clinical situations. eGFR's persistently <60 mL/min signify possible Chronic Kidney Disease.    Anion gap 6 5 - 15  CBC     Status: None   Collection Time:  10/07/14  7:03 AM  Result Value Ref Range   WBC 8.1 3.8 - 10.6 K/uL   RBC 4.96 4.40 - 5.90 MIL/uL   Hemoglobin 15.1 13.0 - 18.0 g/dL   HCT 44.6 40.0 - 52.0 %   MCV 89.9 80.0 - 100.0 fL   MCH 30.4 26.0 - 34.0 pg   MCHC 33.9 32.0 - 36.0 g/dL   RDW 13.6 11.5 - 14.5 %   Platelets 187 150 - 440 K/uL  Troponin I     Status: None   Collection Time: 10/07/14  7:03 AM  Result Value Ref Range   Troponin I <0.03 <0.031 ng/mL    Comment:        NO INDICATION OF MYOCARDIAL INJURY.   Protime-INR     Status: Abnormal   Collection Time: 10/07/14  7:03 AM  Result Value Ref Range   Prothrombin Time 25.2 (H) 11.4 - 15.0 seconds   INR 2.27   Urinalysis complete, with microscopic ([facility] only)     Status: Abnormal   Collection Time: 10/07/14 10:20 AM  Result Value Ref Range   Color, Urine YELLOW (A) YELLOW   APPearance CLEAR (A) CLEAR   Glucose, UA NEGATIVE  NEGATIVE mg/dL   Bilirubin Urine NEGATIVE NEGATIVE   Ketones, ur NEGATIVE NEGATIVE mg/dL   Specific Gravity, Urine 1.019 1.005 - 1.030   Hgb urine dipstick NEGATIVE NEGATIVE   pH 8.0 5.0 - 8.0   Protein, ur NEGATIVE NEGATIVE mg/dL   Nitrite NEGATIVE NEGATIVE   Leukocytes, UA NEGATIVE NEGATIVE   RBC / HPF 0-5 0 - 5 RBC/hpf   WBC, UA NONE SEEN 0 - 5 WBC/hpf   Bacteria, UA NONE SEEN NONE SEEN   Squamous Epithelial / LPF NONE SEEN NONE SEEN   Mucous PRESENT   Lactic acid, plasma     Status: Abnormal   Collection Time: 10/07/14 10:20 AM  Result Value Ref Range   Lactic Acid, Venous 2.5 (HH) 0.5 - 2.0 mmol/L    Comment: CRITICAL RESULT CALLED TO, READ BACK BY AND VERIFIED WITH CASEY PIERCE @ 1106 ON 10/07/14 CAF    Dg Abd 1 View  10/07/2014   CLINICAL DATA:  Nasogastric tube placement  EXAM: ABDOMEN - 1 VIEW  COMPARISON:  None.  FINDINGS: Malposition nasogastric tube with coiling in the esophagus. The loop is at the level of the mid thoracic esophagus with tip or side-port at the level of the aortic arch.  The visualized lungs are clear. Normal heart size and mild aortic tortuosity. Dual-chamber pacer from the left.  These results were called by telephone at the time of interpretation on 10/07/2014 at 2:00 pm to [place] who verbally acknowledged these results.  IMPRESSION: Malpositioned nasogastric tube, coiled in the esophagus.   Electronically Signed   By: Monte Fantasia M.D.   On: 10/07/2014 14:01   Ct Abdomen Pelvis W Contrast  10/07/2014   CLINICAL DATA:  Periumbilical to left-sided abdominal pain beginning this morning.  EXAM: CT ABDOMEN AND PELVIS WITH CONTRAST  TECHNIQUE: Multidetector CT imaging of the abdomen and pelvis was performed using the standard protocol following bolus administration of intravenous contrast.  CONTRAST:  124m OMNIPAQUE IOHEXOL 300 MG/ML  SOLN  COMPARISON:  None.  FINDINGS: Lower chest and abdominal wall: Tiny hiatal hernia. Right ventricular pacer lead noted. No  acute finding.  Hepatobiliary: No focal liver abnormality.Cholecystectomy which accounts for mild common bile duct enlargement. No calcified choledocholithiasis.  Pancreas: Unremarkable.  Spleen: Lobulated spleen without focal mass or  enlargement.  Adrenals/Urinary Tract: Negative adrenals. No hydronephrosis or stone. Sub cm low-density from the lower pole right kidney appears simple on coronal reformats and favors an incidental cyst, evaluation limited by small size. Moderately distended. No acute findings.  Reproductive:Prostatomegaly projecting into the bladder base. Central lucency compatible with previous TURP.  Stomach/Bowel: There is dilated mid small bowel with fluid levels and abrupt transition to decompressed small bowel, with twisting seen on image 52 of series 2. This twist is also confirmed on coronal image 55, affecting 2 segments of bowel in this location. Appearance suggests adhesive disease. No overt twisting of the mesentery, which is congested. No evidence of pneumatosis or perforation.  Extensive distal colonic diverticulosis.  Vascular/Lymphatic: No acute vascular abnormality. No mass or adenopathy.  Peritoneal: No pneumoperitoneum.  Musculoskeletal: No acute abnormalities.  IMPRESSION: 1. Mid small bowel obstruction with pelvic transition point. Mild small bowel distention is likely from recent onset rather than partial obstruction. 2. Chronic findings are noted above.   Electronically Signed   By: Monte Fantasia M.D.   On: 10/07/2014 10:19    Review of Systems  Constitutional: Negative for fever, chills, weight loss, malaise/fatigue and diaphoresis.  HENT: Negative.   Eyes: Negative.   Respiratory: Negative for cough and hemoptysis.   Cardiovascular: Negative for chest pain, palpitations and orthopnea.  Gastrointestinal: Positive for nausea, vomiting and abdominal pain. Negative for heartburn.  Genitourinary: Negative.   Skin: Negative.   Neurological: Positive for weakness.   Endo/Heme/Allergies: Negative.   All other systems reviewed and are negative.   Blood pressure 158/84, pulse 71, temperature 98.2 F (36.8 C), temperature source Oral, resp. rate 20, height _0  (1.905 m), weight 170 lb (77.111 kg), SpO2 100 %. Physical Exam  Constitutional: He is oriented to person, place, and time. He appears well-developed and well-nourished. No distress.  HENT:  Head: Normocephalic and atraumatic.  Eyes: Conjunctivae are normal. Pupils are equal, round, and reactive to light.  Neck: Neck supple.  Cardiovascular: Normal rate, regular rhythm and normal pulses.   No murmur heard. GI: Soft. He exhibits no distension and no mass. There is no tenderness. There is no rebound and no guarding.  Musculoskeletal: He exhibits no edema or tenderness.  Neurological: He is oriented to person, place, and time.  Skin: Skin is warm. He is not diaphoretic.  Psychiatric: He has a normal mood and affect. His behavior is normal. Judgment and thought content normal.    I personally reviewed the CT scan images there is a evidence of a adhesive small bowel obstruction with a clear transition point. There is a moderate amount of free fluid. I see no evidence of pneumatosis intestinalis or free air. Remaining intra-abdominal organs were unremarkable. The gallbladder is surgically absent.  Assessment/Plan   81 year old male with adhesive small bowel obstruction. At present I would continue nasogastric tube decompression and observation. We will repeat his x-rays in the morning along with his BMP and CBC.  I see no evidence of a need for urgent surgical intervention.  Sherri Rad 10/07/2014, 3:21 PM

## 2014-10-07 NOTE — ED Notes (Signed)
Reports generalized abd pain onset 0230 this morning and n/v.  Skin w/d with good color.  A&o.  Ambulates well to stretcher. Reports normal bowel movements and denies urinary sx.

## 2014-10-07 NOTE — H&P (Deleted)
[facility] [place] at [facility]   PATIENT NAME: Johnny Norman    MR#:  [id number]  DATE OF BIRTH:  [birth year]  DATE OF ADMISSION:  10/07/2014  PRIMARY CARE PHYSICIAN: Corky Downs, MD   REQUESTING/REFERRING PHYSICIAN: Natale Lay, MD  CHIEF COMPLAINT:   Chief Complaint  Patient presents with  . Abdominal Pain  . Vomiting   abdominal pain, nausea and vomiting today. Reason for consult: Preoperative evaluation.  HISTORY OF PRESENT ILLNESS:  Maksim Peregoy  is a 81 y.o. male with a known history of A. fib, sick sinus syndrome, CHF and hyperlipidemia. The patient was admitted for abdominal pain, nausea and vomiting today. Patient has not had any bowel movement for the past 2 days. He was admitted for small bowel obstruction and is put on NGT suction now. Abdominal pain was 8 out of 10 and now decreased to 3-4 out of 10. The patient denies any melena or bloody stool. But he complains of a couple urination due to BPH. The patient denies any chest pain, palpitation, shortness of breath or leg edema.  PAST MEDICAL HISTORY:   Past Medical History  Diagnosis Date  . Claudication     leg  . A-fib   . Thyroid disease   . Hyperlipidemia   . CHF (congestive heart failure)     PAST SURGICAL HISTOIRY:   Past Surgical History  Procedure Laterality Date  . Insert / replace / remove pacemaker  09/13/2008    SSS s/p pacemaker implant; Medtronic pacemaker,.  . Colonoscopy    . Cholecystectomy    . Appendectomy    . Prostate surgery      SOCIAL HISTORY:   Social History  Substance Use Topics  . Smoking status: Never Smoker   . Smokeless tobacco: Not on file  . Alcohol Use: No     Comment: occasional    FAMILY HISTORY:   Family History  Problem Relation Age of Onset  . Family history unknown: Yes   mother and father deceased. Mother had Alzheimer's disease and cancer.  DRUG ALLERGIES:  No Known Allergies  REVIEW OF SYSTEMS:  CONSTITUTIONAL:  No fever, fatigue or weakness.  EYES: No blurred or double vision.  EARS, NOSE, AND THROAT: No tinnitus or ear pain.  RESPIRATORY: No cough, shortness of breath, wheezing or hemoptysis.  CARDIOVASCULAR: No chest pain, orthopnea, edema.  GASTROINTESTINAL: Abdominal pain, nausea and vomiting, no diarrhea but no bowel movement for 2 days. GENITOURINARY: No dysuria, hematuria. But has difficulty urination. ENDOCRINE: No polyuria, nocturia,  HEMATOLOGY: No anemia, easy bruising or bleeding SKIN: No rash or lesion. MUSCULOSKELETAL: No joint pain or arthritis.   NEUROLOGIC: No tingling, numbness, weakness.  PSYCHIATRY: No anxiety or depression.   MEDICATIONS AT HOME:   Prior to Admission medications   Medication Sig Start Date End Date Taking? Authorizing Provider  aspirin 81 MG tablet Take 81 mg by mouth daily.   Yes Historical Provider, MD  atorvastatin (LIPITOR) 10 MG tablet Take 10 mg by mouth daily.   Yes Historical Provider, MD  cholecalciferol (VITAMIN D) 1000 UNITS tablet Take 1,000 Units by mouth daily.   Yes Historical Provider, MD  fish oil-omega-3 fatty acids 1000 MG capsule Take 1 g by mouth 3 (three) times daily.   Yes Historical Provider, MD  Flaxseed, Linseed, (FLAXSEED OIL) 1000 MG CAPS Take 1,000 mg by mouth daily.   Yes Historical Provider, MD  levothyroxine (LEVOTHROID) 88 MCG tablet Take 88 mcg by mouth daily.   Yes  Historical Provider, MD  mirabegron ER (MYRBETRIQ) 25 MG TB24 Take 25 mg by mouth daily.   Yes Historical Provider, MD  Multiple Vitamin (MULTIVITAMIN) tablet Take 1 tablet by mouth daily.   Yes Historical Provider, MD  nadolol (CORGARD) 20 MG tablet Take 20 mg by mouth daily.    Yes Historical Provider, MD  warfarin (COUMADIN) 5 MG tablet Take 5 mg by mouth daily.   Yes Historical Provider, MD      VITAL SIGNS:  Blood pressure 167/87, pulse 72, temperature 98.1 F (36.7 C), temperature source Oral, resp. rate 16, height  (1.905 m), weight 77.111 kg  (170 lb), SpO2 99 %.  PHYSICAL EXAMINATION:  GENERAL:  81 y.o.-year-old patient lying in the bed with no acute distress.  EYES: Pupils equal, round, reactive to light and accommodation. No scleral icterus. Extraocular muscles intact.  HEENT: Head atraumatic, normocephalic. Oropharynx and nasopharynx clear.  NECK:  Supple, no jugular venous distention. No thyroid enlargement, no tenderness.  LUNGS: Normal breath sounds bilaterally, no wheezing, rales,rhonchi or crepitation. No use of accessory muscles of respiration.  CARDIOVASCULAR: S1, S2 normal. No murmurs, rubs, or gallops.  ABDOMEN: Soft, mild tenderness in mid abdomen, nondistended. Bowel sounds present. No organomegaly or mass.  EXTREMITIES: No pedal edema, cyanosis, or clubbing.  NEUROLOGIC: Cranial nerves II through XII are intact. Muscle strength 5/5 in all extremities. Sensation intact. Gait not checked.  PSYCHIATRIC: The patient is alert and oriented x 3.  SKIN: No obvious rash, lesion, or ulcer.   LABORATORY PANEL:   CBC  Recent Labs Lab 10/07/14 0703  WBC 8.1  HGB 15.1  HCT 44.6  PLT 187   ------------------------------------------------------------------------------------------------------------------  Chemistries   Recent Labs Lab 10/07/14 0703  NA 138  K 4.1  CL 105  CO2 27  GLUCOSE 128*  BUN 20  CREATININE 1.23  CALCIUM 9.4  AST 34  ALT 21  ALKPHOS 71  BILITOT 1.3*   ------------------------------------------------------------------------------------------------------------------  Cardiac Enzymes  Recent Labs Lab 10/07/14 0703  TROPONINI <0.03   ------------------------------------------------------------------------------------------------------------------  RADIOLOGY:  Dg Abd 1 View  10/07/2014   CLINICAL DATA:  Nasogastric tube placement  EXAM: ABDOMEN - 1 VIEW  COMPARISON:  None.  FINDINGS: Malposition nasogastric tube with coiling in the esophagus. The loop is at the level of the mid  thoracic esophagus with tip or side-port at the level of the aortic arch.  The visualized lungs are clear. Normal heart size and mild aortic tortuosity. Dual-chamber pacer from the left.  These results were called by telephone at the time of interpretation on 10/07/2014 at 2:00 pm to RN Sonja who verbally acknowledged these results.  IMPRESSION: Malpositioned nasogastric tube, coiled in the esophagus.   Electronically Signed   By: Marnee Spring M.D.   On: 10/07/2014 14:01   Ct Abdomen Pelvis W Contrast  10/07/2014   CLINICAL DATA:  Periumbilical to left-sided abdominal pain beginning this morning.  EXAM: CT ABDOMEN AND PELVIS WITH CONTRAST  TECHNIQUE: Multidetector CT imaging of the abdomen and pelvis was performed using the standard protocol following bolus administration of intravenous contrast.  CONTRAST:  OMNIPAQUE IOHEXOL 300 MG/ML  SOLN  COMPARISON:  None.  FINDINGS: Lower chest and abdominal wall: Tiny hiatal hernia. Right ventricular pacer lead noted. No acute finding.  Hepatobiliary: No focal liver abnormality.Cholecystectomy which accounts for mild common bile duct enlargement. No calcified choledocholithiasis.  Pancreas: Unremarkable.  Spleen: Lobulated spleen without focal mass or enlargement.  Adrenals/Urinary Tract: Negative adrenals. No hydronephrosis or stone.  Sub cm low-density from the lower pole right kidney appears simple on coronal reformats and favors an incidental cyst, evaluation limited by small size. Moderately distended. No acute findings.  Reproductive:Prostatomegaly projecting into the bladder base. Central lucency compatible with previous TURP.  Stomach/Bowel: There is dilated mid small bowel with fluid levels and abrupt transition to decompressed small bowel, with twisting seen on image 52 of series 2. This twist is also confirmed on coronal image 55, affecting 2 segments of bowel in this location. Appearance suggests adhesive disease. No overt twisting of the mesentery,  which is congested. No evidence of pneumatosis or perforation.  Extensive distal colonic diverticulosis.  Vascular/Lymphatic: No acute vascular abnormality. No mass or adenopathy.  Peritoneal: No pneumoperitoneum.  Musculoskeletal: No acute abnormalities.  IMPRESSION: 1. Mid small bowel obstruction with pelvic transition point. Mild small bowel distention is likely from recent onset rather than partial obstruction. 2. Chronic findings are noted above.   Electronically Signed   By: Marnee Spring M.D.   On: 10/07/2014 10:19   Dg Abd Portable 1v  10/07/2014   CLINICAL DATA:  NG tube placement  EXAM: PORTABLE ABDOMEN - 1 VIEW  COMPARISON:  Same date at 1:32 p.m.  FINDINGS: NG tube tip terminates over the left upper quadrant over the expected location of the greater curvature of the stomach. No change otherwise.  IMPRESSION: Nasogastric tube tip over the expected location of the greater curvature of the stomach.   Electronically Signed   By: Christiana Pellant M.D.   On: 10/07/2014 15:34    EKG:   Orders placed or performed during the hospital encounter of 10/07/14  . EKG 12-Lead  . EKG 12-Lead    IMPRESSION AND PLAN:   Small bowel obstruction Lactic acidosis Hyperlipidemia A. Fib BPH  Small bowel obstruction, continue NGT suction and follow-up Dr. Egbert Garibaldi. Start IV fluid support and continue pain control. For history of A. fib, continue nadolol, hold Coumadin at this time for possible surgery, follow-up INR. Continue aspirin but hold if there is a plan for surgery. The patient has moderate to high risk for abdominal surgery.   All the records are reviewed and case discussed with Consulting provider. Management plans discussed with the patient, family and they are in agreement.  CODE STATUS: Full code  TOTAL TIME TAKING CARE OF THIS PATIENT: 53 minutes.    Shaune Pollack M.D on 10/07/2014 at 6:11 PM  Between 7am to 6pm - Pager - [phone]  After 6pm go to [email or web address] - password [email or web address]  [facility]  [place] [facility]  Office  [phone]  CC: Primary care Physician: Corky Downs, MD

## 2014-10-07 NOTE — ED Notes (Signed)
Pt reports periumbilical pain that began approx 0200 this morning - pt admits to x2 episodes of n/v and LNBM 10/05/14 - pt denies fever or any blood in emesis or stool.

## 2014-10-07 NOTE — ED Notes (Signed)
CT notified patient finished with contrast 

## 2014-10-07 NOTE — ED Notes (Signed)
Lactic acid sent to lab 

## 2014-10-07 NOTE — ED Provider Notes (Signed)
[facility] Emergency Department Provider Note  Time seen: 8:46 AM  I have reviewed the triage vital signs and the nursing notes.   HISTORY  Chief Complaint Abdominal Pain and Vomiting    HPI Johnny Norman is a 81 y.o. male with a past medical history of atrial fibrillation, hyperlipidemia on Coumadin, presents to the emergency department with abdominal pain 7 hours. According to the patient at 2 AM he was awoken by periumbilical abdominal pain. He states moderate sharp pain around his belly button radiating to his left abdomen. States 2 episodes of nausea and vomiting. Denies any diarrhea or constipation. Denies any fever, dysuria, or hematuria. States it feels like he ate something that didn't agree with his stomach.     Past Medical History  Diagnosis Date  . Claudication     leg  . A-fib   . Thyroid disease   . Hyperlipidemia     Patient Active Problem List   Diagnosis Date Noted  . Encounter for anticoagulation discussion and counseling 10/05/2014  . Palpitations 10/05/2014  . Bilateral leg edema 09/07/2013  . Unsteady gait 09/07/2013  . A-fib 12/02/2011  . Right leg pain 12/02/2011  . Hyperlipidemia 12/02/2011  . Sick sinus syndrome 12/02/2011    Past Surgical History  Procedure Laterality Date  . Insert / replace / remove pacemaker  09/13/2008    SSS s/p pacemaker implant; Medtronic pacemaker,.  . Colonoscopy    . Cholecystectomy    . Appendectomy    . Prostate surgery      Current Outpatient Rx  Name  Route  Sig  Dispense  Refill  . aspirin 81 MG tablet   Oral   Take 81 mg by mouth daily.         Marland Kitchen atorvastatin (LIPITOR) 10 MG tablet   Oral   Take 10 mg by mouth daily.         . cholecalciferol (VITAMIN D) 1000 UNITS tablet   Oral   Take 1,000 Units by mouth daily.         . fish oil-omega-3 fatty acids 1000 MG capsule   Oral   Take 1 g by mouth 3 (three) times daily.         . Flaxseed, Linseed, (FLAXSEED  OIL) 1000 MG CAPS   Oral   Take 1,000 mg by mouth daily.         Marland Kitchen levothyroxine (LEVOTHROID) 88 MCG tablet   Oral   Take 88 mcg by mouth daily.         . mirabegron ER (MYRBETRIQ) 25 MG TB24   Oral   Take 25 mg by mouth daily.         . Multiple Vitamin (MULTIVITAMIN) tablet   Oral   Take 1 tablet by mouth daily.         . nadolol (CORGARD) 20 MG tablet   Oral   Take 20 mg by mouth daily.          Marland Kitchen warfarin (COUMADIN) 5 MG tablet   Oral   Take 5 mg by mouth daily.           Allergies Review of patient's allergies indicates no known allergies.  Family History  Problem Relation Age of Onset  . Family history unknown: Yes    Social History Social History  Substance Use Topics  . Smoking status: Never Smoker   . Smokeless tobacco: None  . Alcohol Use: No     Comment: occasional  Review of Systems Constitutional: Negative for fever. Cardiovascular: Negative for chest pain. Respiratory: Negative for shortness of breath. Gastrointestinal: Positive for periumbilical left-sided abdominal pain. Positive for nausea and vomiting. Negative for diarrhea or constipation. Genitourinary: Negative for dysuria. Negative for hematuria. Musculoskeletal: Negative for back pain. 10-point ROS otherwise negative.  ____________________________________________   PHYSICAL EXAM:  VITAL SIGNS: ED Triage Vitals  Enc Vitals Group     BP 10/07/14 0653 153/99 mmHg     Pulse Rate 10/07/14 0653 72     Resp 10/07/14 0653 16     Temp 10/07/14 0653 98.2 F (36.8 C)     Temp Source 10/07/14 0653 Oral     SpO2 10/07/14 0653 98 %     Weight 10/07/14 0653 170 lb (77.111 kg)     Height 10/07/14 0653 6\' 3"  (1.905 m)     Head Cir --      Peak Flow --      Pain Score 10/07/14 0656 7     Pain Loc --      Pain Edu? --      Excl. in GC? --     Constitutional: Alert and oriented. Well appearing and in no distress. Eyes: Normal exam ENT   Mouth/Throat: Mucous membranes  are moist. Cardiovascular: Normal rate. Respiratory: Normal respiratory effort without tachypnea nor retractions. Breath sounds are clear and equal bilaterally. No wheezes/rales/rhonchi. Gastrointestinal: Soft, mild periumbilical left-sided abdominal tenderness to palpation. No rebound or guarding. No distention. Musculoskeletal: Nontender with normal range of motion in all extremities.  Neurologic:  Normal speech and language. No gross focal neurologic deficits Skin:  Skin is warm, dry and intact.  Psychiatric: Mood and affect are normal. Speech and behavior are normal.  ____________________________________________     RADIOLOGY  CT scan consistent with bowel obstruction.   EKG reviewed and interpreted by myself shows sinus rhythm at 73 bpm first-degree AV block present, narrow QRS, left axis deviation, no ST elevations noted. Of note the patient does have pacemaker spikes which greatly proceed the QRS complex however time between electrical spiking QRS complex appears regular and stable. ____________________________________________   INITIAL IMPRESSION / ASSESSMENT AND PLAN / ED COURSE  Pertinent labs & imaging results that were available during my care of the patient were reviewed by me and considered in my medical decision making (see chart for details).  Patient with mid to left-sided abdominal pain since 2 AM. Labs are largely within normal limits, we will add on a troponin as well as a venous lactate. We will proceed with a CT abdomen and pelvis to further evaluate. We will treat pain and nausea, and IV hydrate while awaiting CT scan. Patient is agreeable to plan.  CT consistent with bowel ejection. We will place an NG tube. Patient continues with pain we will re-dose pain medication. Currently awaiting INR result. I discussed the patient with surgery will be down to see the patient. ____________________________________________   FINAL CLINICAL IMPRESSION(S) / ED  DIAGNOSES  Abdominal pain Small bowel obstruction  Minna Antis, MD 10/07/14 1034

## 2014-10-07 NOTE — ED Notes (Signed)
NG in left nares removed after attempts to redirect to stomach were unsuccessful.

## 2014-10-07 NOTE — Consult Note (Signed)
[facility] - [place] at [facility]   PATIENT NAME: Savino Whisenant    MR#:  [id number]  DATE OF BIRTH:  [birth year]  DATE OF ADMISSION:  10/07/2014  PRIMARY CARE PHYSICIAN: Corky Downs, MD   REQUESTING/REFERRING PHYSICIAN: Natale Lay, MD  CHIEF COMPLAINT:   Chief Complaint  Patient presents with  . Abdominal Pain  . Vomiting   abdominal pain, nausea and vomiting today. Reason for consult: Preoperative evaluation.  HISTORY OF PRESENT ILLNESS:  Maksim Peregoy  is a 81 y.o. male with a known history of A. fib, sick sinus syndrome, CHF and hyperlipidemia. The patient was admitted for abdominal pain, nausea and vomiting today. Patient has not had any bowel movement for the past 2 days. He was admitted for small bowel obstruction and is put on NGT suction now. Abdominal pain was 8 out of 10 and now decreased to 3-4 out of 10. The patient denies any melena or bloody stool. But he complains of a couple urination due to BPH. The patient denies any chest pain, palpitation, shortness of breath or leg edema.  PAST MEDICAL HISTORY:   Past Medical History  Diagnosis Date  . Claudication     leg  . A-fib   . Thyroid disease   . Hyperlipidemia   . CHF (congestive heart failure)     PAST SURGICAL HISTOIRY:   Past Surgical History  Procedure Laterality Date  . Insert / replace / remove pacemaker  09/13/2008    SSS s/p pacemaker implant; [employer] pacemaker,.  . Colonoscopy    . Cholecystectomy    . Appendectomy    . Prostate surgery      SOCIAL HISTORY:   Social History  Substance Use Topics  . Smoking status: Never Smoker   . Smokeless tobacco: Not on file  . Alcohol Use: No     Comment: occasional    FAMILY HISTORY:   Family History  Problem Relation Age of Onset  . Family history unknown: Yes   mother and father deceased. Mother had Alzheimer's disease and cancer.  DRUG ALLERGIES:  No Known Allergies  REVIEW OF SYSTEMS:  CONSTITUTIONAL:  No fever, fatigue or weakness.  EYES: No blurred or double vision.  EARS, NOSE, AND THROAT: No tinnitus or ear pain.  RESPIRATORY: No cough, shortness of breath, wheezing or hemoptysis.  CARDIOVASCULAR: No chest pain, orthopnea, edema.  GASTROINTESTINAL: Abdominal pain, nausea and vomiting, no diarrhea but no bowel movement for 2 days. GENITOURINARY: No dysuria, hematuria. But has difficulty urination. ENDOCRINE: No polyuria, nocturia,  HEMATOLOGY: No anemia, easy bruising or bleeding SKIN: No rash or lesion. MUSCULOSKELETAL: No joint pain or arthritis.   NEUROLOGIC: No tingling, numbness, weakness.  PSYCHIATRY: No anxiety or depression.   MEDICATIONS AT HOME:   Prior to Admission medications   Medication Sig Start Date End Date Taking? Authorizing Provider  aspirin 81 MG tablet Take 81 mg by mouth daily.   Yes Historical Provider, MD  atorvastatin (LIPITOR) 10 MG tablet Take 10 mg by mouth daily.   Yes Historical Provider, MD  cholecalciferol (VITAMIN D) 1000 UNITS tablet Take 1,000 Units by mouth daily.   Yes Historical Provider, MD  fish oil-omega-3 fatty acids 1000 MG capsule Take 1 g by mouth 3 (three) times daily.   Yes Historical Provider, MD  Flaxseed, Linseed, (FLAXSEED OIL) 1000 MG CAPS Take 1,000 mg by mouth daily.   Yes Historical Provider, MD  levothyroxine (LEVOTHROID) 88 MCG tablet Take 88 mcg by mouth daily.   Yes  Historical Provider, MD  mirabegron ER (MYRBETRIQ) 25 MG TB24 Take 25 mg by mouth daily.   Yes Historical Provider, MD  Multiple Vitamin (MULTIVITAMIN) tablet Take 1 tablet by mouth daily.   Yes Historical Provider, MD  nadolol (CORGARD) 20 MG tablet Take 20 mg by mouth daily.    Yes Historical Provider, MD  warfarin (COUMADIN) 5 MG tablet Take 5 mg by mouth daily.   Yes Historical Provider, MD      VITAL SIGNS:  Blood pressure 167/87, pulse 72, temperature 98.1 F (36.7 C), temperature source Oral, resp. rate 16, height  (1.905 m), weight 77.111 kg  (170 lb), SpO2 99 %.  PHYSICAL EXAMINATION:  GENERAL:  81 y.o.-year-old patient lying in the bed with no acute distress.  EYES: Pupils equal, round, reactive to light and accommodation. No scleral icterus. Extraocular muscles intact.  HEENT: Head atraumatic, normocephalic. Oropharynx and nasopharynx clear.  NECK:  Supple, no jugular venous distention. No thyroid enlargement, no tenderness.  LUNGS: Normal breath sounds bilaterally, no wheezing, rales,rhonchi or crepitation. No use of accessory muscles of respiration.  CARDIOVASCULAR: S1, S2 normal. No murmurs, rubs, or gallops.  ABDOMEN: Soft, mild tenderness in mid abdomen, nondistended. Bowel sounds present. No organomegaly or mass.  EXTREMITIES: No pedal edema, cyanosis, or clubbing.  NEUROLOGIC: Cranial nerves II through XII are intact. Muscle strength 5/5 in all extremities. Sensation intact. Gait not checked.  PSYCHIATRIC: The patient is alert and oriented x 3.  SKIN: No obvious rash, lesion, or ulcer.   LABORATORY PANEL:   CBC  Recent Labs Lab 10/07/14 0703  WBC 8.1  HGB 15.1  HCT 44.6  PLT 187   ------------------------------------------------------------------------------------------------------------------  Chemistries   Recent Labs Lab 10/07/14 0703  NA 138  K 4.1  CL 105  CO2 27  GLUCOSE 128*  BUN 20  CREATININE 1.23  CALCIUM 9.4  AST 34  ALT 21  ALKPHOS 71  BILITOT 1.3*   ------------------------------------------------------------------------------------------------------------------  Cardiac Enzymes  Recent Labs Lab 10/07/14 0703  TROPONINI <0.03   ------------------------------------------------------------------------------------------------------------------  RADIOLOGY:  Dg Abd 1 View  10/07/2014   CLINICAL DATA:  Nasogastric tube placement  EXAM: ABDOMEN - 1 VIEW  COMPARISON:  None.  FINDINGS: Malposition nasogastric tube with coiling in the esophagus. The loop is at the level of the mid  thoracic esophagus with tip or side-port at the level of the aortic arch.  The visualized lungs are clear. Normal heart size and mild aortic tortuosity. Dual-chamber pacer from the left.  These results were called by telephone at the time of interpretation on 10/07/2014 at 2:00 pm to RN Sonja who verbally acknowledged these results.  IMPRESSION: Malpositioned nasogastric tube, coiled in the esophagus.   Electronically Signed   By: Marnee Spring M.D.   On: 10/07/2014 14:01   Ct Abdomen Pelvis W Contrast  10/07/2014   CLINICAL DATA:  Periumbilical to left-sided abdominal pain beginning this morning.  EXAM: CT ABDOMEN AND PELVIS WITH CONTRAST  TECHNIQUE: Multidetector CT imaging of the abdomen and pelvis was performed using the standard protocol following bolus administration of intravenous contrast.  CONTRAST:  OMNIPAQUE IOHEXOL 300 MG/ML  SOLN  COMPARISON:  None.  FINDINGS: Lower chest and abdominal wall: Tiny hiatal hernia. Right ventricular pacer lead noted. No acute finding.  Hepatobiliary: No focal liver abnormality.Cholecystectomy which accounts for mild common bile duct enlargement. No calcified choledocholithiasis.  Pancreas: Unremarkable.  Spleen: Lobulated spleen without focal mass or enlargement.  Adrenals/Urinary Tract: Negative adrenals. No hydronephrosis or stone.  Sub cm low-density from the lower pole right kidney appears simple on coronal reformats and favors an incidental cyst, evaluation limited by small size. Moderately distended. No acute findings.  Reproductive:Prostatomegaly projecting into the bladder base. Central lucency compatible with previous TURP.  Stomach/Bowel: There is dilated mid small bowel with fluid levels and abrupt transition to decompressed small bowel, with twisting seen on image 52 of series 2. This twist is also confirmed on coronal image 55, affecting 2 segments of bowel in this location. Appearance suggests adhesive disease. No overt twisting of the mesentery,  which is congested. No evidence of pneumatosis or perforation.  Extensive distal colonic diverticulosis.  Vascular/Lymphatic: No acute vascular abnormality. No mass or adenopathy.  Peritoneal: No pneumoperitoneum.  Musculoskeletal: No acute abnormalities.  IMPRESSION: 1. Mid small bowel obstruction with pelvic transition point. Mild small bowel distention is likely from recent onset rather than partial obstruction. 2. Chronic findings are noted above.   Electronically Signed   By: Marnee Spring M.D.   On: 10/07/2014 10:19   Dg Abd Portable 1v  10/07/2014   CLINICAL DATA:  NG tube placement  EXAM: PORTABLE ABDOMEN - 1 VIEW  COMPARISON:  Same date at 1:32 p.m.  FINDINGS: NG tube tip terminates over the left upper quadrant over the expected location of the greater curvature of the stomach. No change otherwise.  IMPRESSION: Nasogastric tube tip over the expected location of the greater curvature of the stomach.   Electronically Signed   By: Christiana Pellant M.D.   On: 10/07/2014 15:34    EKG:   Orders placed or performed during the hospital encounter of 10/07/14  . EKG 12-Lead  . EKG 12-Lead    IMPRESSION AND PLAN:   Small bowel obstruction Lactic acidosis Hyperlipidemia A. Fib BPH  Small bowel obstruction, continue NGT suction and follow-up Dr. Egbert Garibaldi. Start IV fluid support and continue pain control. For history of A. fib, continue nadolol, hold Coumadin at this time for possible surgery, follow-up INR. Continue aspirin but hold if there is a plan for surgery. The patient has moderate to high risk for abdominal surgery.   All the records are reviewed and case discussed with Consulting provider. Management plans discussed with the patient, family and they are in agreement.  CODE STATUS: Full code  TOTAL TIME TAKING CARE OF THIS PATIENT: 53 minutes.    Shaune Pollack M.D on 10/07/2014 at 6:31 PM  Between 7am to 6pm - Pager - [phone]  After 6pm go to [email or web address] - password [email or web address]  [facility]  [place] [facility]  Office  [phone]  CC: Primary care Physician: Corky Downs, MD

## 2014-10-08 ENCOUNTER — Inpatient Hospital Stay: Payer: Medicare Other

## 2014-10-08 LAB — COMPREHENSIVE METABOLIC PANEL
ALBUMIN: 3.5 g/dL (ref 3.5–5.0)
ALT: 17 U/L (ref 17–63)
AST: 30 U/L (ref 15–41)
Alkaline Phosphatase: 52 U/L (ref 38–126)
Anion gap: 6 (ref 5–15)
BUN: 16 mg/dL (ref 6–20)
CHLORIDE: 104 mmol/L (ref 101–111)
CO2: 27 mmol/L (ref 22–32)
CREATININE: 1.19 mg/dL (ref 0.61–1.24)
Calcium: 8.7 mg/dL — ABNORMAL LOW (ref 8.9–10.3)
GFR calc Af Amer: >60 mL/min (ref >60)
GFR calc non Af Amer: 53 mL/min — ABNORMAL LOW (ref >60)
GLUCOSE: 130 mg/dL — AB (ref 65–99)
POTASSIUM: 4 mmol/L (ref 3.5–5.1)
Sodium: 137 mmol/L (ref 135–145)
Total Bilirubin: 1.4 mg/dL — ABNORMAL HIGH (ref 0.3–1.2)
Total Protein: 5.9 g/dL — ABNORMAL LOW (ref 6.5–8.1)

## 2014-10-08 LAB — CBC
HEMATOCRIT: 41.5 % (ref 40.0–52.0)
Hemoglobin: 13.8 g/dL (ref 13.0–18.0)
MCH: 29.7 pg (ref 26.0–34.0)
MCHC: 33.2 g/dL (ref 32.0–36.0)
MCV: 89.5 fL (ref 80.0–100.0)
PLATELETS: 173 10*3/uL (ref 150–440)
RBC: 4.64 MIL/uL (ref 4.40–5.90)
RDW: 13.4 % (ref 11.5–14.5)
WBC: 11.8 10*3/uL — ABNORMAL HIGH (ref 3.8–10.6)

## 2014-10-08 LAB — PROTIME-INR
INR: 2.11
Prothrombin Time: 23.8 seconds — ABNORMAL HIGH (ref 11.4–15.0)

## 2014-10-08 LAB — MAGNESIUM: Magnesium: 1.8 mg/dL (ref 1.7–2.4)

## 2014-10-08 MED ORDER — KCL IN DEXTROSE-NACL 20-5-0.45 MEQ/L-%-% IV SOLN
INTRAVENOUS | Status: DC
  Administered 2014-10-08 – 2014-10-13 (×14): via INTRAVENOUS
  Filled 2014-10-08 (×19): qty 1000

## 2014-10-08 MED ORDER — ENOXAPARIN SODIUM 40 MG/0.4ML ~~LOC~~ SOLN
40.0000 mg | SUBCUTANEOUS | Status: DC
  Administered 2014-10-08 – 2014-10-11 (×4): 40 mg via SUBCUTANEOUS
  Filled 2014-10-08 (×4): qty 0.4

## 2014-10-08 MED ORDER — HYDRALAZINE HCL 20 MG/ML IJ SOLN
10.0000 mg | Freq: Four times a day (QID) | INTRAMUSCULAR | Status: DC | PRN
Start: 2014-10-08 — End: 2014-10-18

## 2014-10-08 NOTE — Progress Notes (Signed)
[facility] -  at [facility]   PATIENT NAME: Johnny Norman    MR#:  [id number]  DATE OF BIRTH:  August 24, 1925  SUBJECTIVE:  CHIEF COMPLAINT:   Chief Complaint  Patient presents with  . Abdominal Pain  . Vomiting   no complains, having suction via NGT. Stable.  REVIEW OF SYSTEMS:  CONSTITUTIONAL: No fever, fatigue or weakness.  EYES: No blurred or double vision.  EARS, NOSE, AND THROAT: No tinnitus or ear pain.  RESPIRATORY: No cough, shortness of breath, wheezing or hemoptysis.  CARDIOVASCULAR: No chest pain, orthopnea, edema.  GASTROINTESTINAL: No nausea, vomiting, diarrhea or abdominal pain. Having NGT suction. GENITOURINARY: No dysuria, hematuria.  ENDOCRINE: No polyuria, nocturia,  HEMATOLOGY: No anemia, easy bruising or bleeding SKIN: No rash or lesion. MUSCULOSKELETAL: No joint pain or arthritis.   NEUROLOGIC: No tingling, numbness, weakness.  PSYCHIATRY: No anxiety or depression.   ROS  DRUG ALLERGIES:  No Known Allergies  VITALS:  Blood pressure 155/90, pulse 72, temperature 99.4 F (37.4 C), temperature source Oral, resp. rate 19, height  (1.905 m), weight 77.656 kg (171 lb 3.2 oz), SpO2 98 %.  PHYSICAL EXAMINATION:  GENERAL:  81 y.o.-year-old patient lying in the bed with no acute distress.  EYES: Pupils equal, round, reactive to light and accommodation. No scleral icterus. Extraocular muscles intact.  HEENT: Head atraumatic, normocephalic. Oropharynx and nasopharynx clear.  NECK:  Supple, no jugular venous distention. No thyroid enlargement, no tenderness.  LUNGS: Normal breath sounds bilaterally, no wheezing, rales,rhonchi or crepitation. No use of accessory muscles of respiration.  CARDIOVASCULAR: S1, S2 normal. No murmurs, rubs, or gallops.  ABDOMEN: Soft, nontender, milddistended. Sluggish Bowel sounds present. No organomegaly or mass. NGT suction in place. EXTREMITIES: No pedal edema, cyanosis, or clubbing.   NEUROLOGIC: Cranial nerves II through XII are intact. Muscle strength 5/5 in all extremities. Sensation intact. Gait not checked.  PSYCHIATRIC: The patient is alert and oriented x 3.  SKIN: No obvious rash, lesion, or ulcer.   Physical Exam LABORATORY PANEL:   CBC  Recent Labs Lab 10/08/14 0434  WBC 11.8*  HGB 13.8  HCT 41.5  PLT 173   ------------------------------------------------------------------------------------------------------------------  Chemistries   Recent Labs Lab 10/08/14 0434  NA 137  K 4.0  CL 104  CO2 27  GLUCOSE 130*  BUN 16  CREATININE 1.19  CALCIUM 8.7*  MG 1.8  AST 30  ALT 17  ALKPHOS 52  BILITOT 1.4*   ------------------------------------------------------------------------------------------------------------------  Cardiac Enzymes  Recent Labs Lab 10/07/14 0703  TROPONINI <0.03   ------------------------------------------------------------------------------------------------------------------  RADIOLOGY:  Dg Abd 1 View  10/07/2014   CLINICAL DATA:  Nasogastric tube placement  EXAM: ABDOMEN - 1 VIEW  COMPARISON:  None.  FINDINGS: Malposition nasogastric tube with coiling in the esophagus. The loop is at the level of the mid thoracic esophagus with tip or side-port at the level of the aortic arch.  The visualized lungs are clear. Normal heart size and mild aortic tortuosity. Dual-chamber pacer from the left.  These results were called by telephone at the time of interpretation on 10/07/2014 at 2:00 pm to RN Sonja who verbally acknowledged these results.  IMPRESSION: Malpositioned nasogastric tube, coiled in the esophagus.   Electronically Signed   By: Marnee Spring M.D.   On: 10/07/2014 14:01   Ct Abdomen Pelvis W Contrast  10/07/2014   CLINICAL DATA:  Periumbilical to left-sided abdominal pain beginning this morning.  EXAM: CT ABDOMEN AND PELVIS WITH CONTRAST  TECHNIQUE: Multidetector  CT imaging of the abdomen and pelvis was performed  using the standard protocol following bolus administration of intravenous contrast.  CONTRAST:  OMNIPAQUE IOHEXOL 300 MG/ML  SOLN  COMPARISON:  None.  FINDINGS: Lower chest and abdominal wall: Tiny hiatal hernia. Right ventricular pacer lead noted. No acute finding.  Hepatobiliary: No focal liver abnormality.Cholecystectomy which accounts for mild common bile duct enlargement. No calcified choledocholithiasis.  Pancreas: Unremarkable.  Spleen: Lobulated spleen without focal mass or enlargement.  Adrenals/Urinary Tract: Negative adrenals. No hydronephrosis or stone. Sub cm low-density from the lower pole right kidney appears simple on coronal reformats and favors an incidental cyst, evaluation limited by small size. Moderately distended. No acute findings.  Reproductive:Prostatomegaly projecting into the bladder base. Central lucency compatible with previous TURP.  Stomach/Bowel: There is dilated mid small bowel with fluid levels and abrupt transition to decompressed small bowel, with twisting seen on image 52 of series 2. This twist is also confirmed on coronal image 55, affecting 2 segments of bowel in this location. Appearance suggests adhesive disease. No overt twisting of the mesentery, which is congested. No evidence of pneumatosis or perforation.  Extensive distal colonic diverticulosis.  Vascular/Lymphatic: No acute vascular abnormality. No mass or adenopathy.  Peritoneal: No pneumoperitoneum.  Musculoskeletal: No acute abnormalities.  IMPRESSION: 1. Mid small bowel obstruction with pelvic transition point. Mild small bowel distention is likely from recent onset rather than partial obstruction. 2. Chronic findings are noted above.   Electronically Signed   By: Marnee Spring M.D.   On: 10/07/2014 10:19   Acute Abdominal Series  10/08/2014   CLINICAL DATA:  81 year old male with a history of pain. Nausea and vomiting.  EXAM: DG ABDOMEN ACUTE W/ 1V CHEST  COMPARISON:  Abdominal CT 10/07/2014, chest  x-ray 10/07/2014, abdominal plain film 10/07/2014  FINDINGS: Chest:  Cardiomediastinal silhouette within normal limits. Tortuosity descending thoracic aorta. Calcifications aortic arch.  No confluent airspace disease.  No pneumothorax or pleural effusion.  Cardiac pacing device on left chest wall with 2 leads in place.  Changes of emphysema again noted.  Abdomen:  Gastric tube terminates in the midline at the diaphragm, not beyond the GE junction.  Multiple borderline dilated small bowel loops within the mid abdomen. Gas and formed stool within the right colon. Small amount of gas within the sigmoid colon and rectum. No unexpected calcifications or radiopaque foreign body.  Surgical changes of cholecystectomy.  Degenerative changes of the spine.  No displaced fracture.  IMPRESSION: Chest:  No radiographic evidence of acute cardiopulmonary disease.  Abdomen:  Gastric tube terminates the distal esophagus, with the tip not beyond the GE junction. This may be advanced slightly into the stomach for better position.  Multiple borderline dilated small bowel loops indicating ileus or potentially small bowel obstruction. Recommend serial plain film until resolution of this abnormal bowel gas pattern given findings on prior CT.  Signed,  Yvone Neu. Loreta Ave, DO  [facility]  [facility]   Electronically Signed   By: Gilmer Mor D.O.   On: 10/08/2014 11:49   Dg Abd Portable 1v  10/07/2014   CLINICAL DATA:  NG tube placement  EXAM: PORTABLE ABDOMEN - 1 VIEW  COMPARISON:  Same date at 1:32 p.m.  FINDINGS: NG tube tip terminates over the left upper quadrant over the expected location of the greater curvature of the stomach. No change otherwise.  IMPRESSION: Nasogastric tube tip over the expected location of the greater curvature of the stomach.   Electronically  Signed   By: Christiana Pellant M.D.   On: 10/07/2014 15:34    ASSESSMENT AND PLAN:   * Small bowel obstruction   Manage  per primary team.   Conservative management, NGT in place, no plans for surgery at this time.  * Lactic acidosis - likely due to bowel obstruction.  * Hyperlipidemia cont statin.  * A. Fib   On betablocker.    Held INR as she may need sx    Still INR is high. Monitor.  * Hypertension   On Naodolol, added hydralazine Inj as needed.  * BPH  All the records are reviewed and case discussed with Care Management/Social Workerr. Management plans discussed with the patient, family and they are in agreement.  CODE STATUS: Full  TOTAL TIME TAKING CARE OF THIS PATIENT: 30 minutes.    POSSIBLE D/C IN 2-3  DAYS, DEPENDING ON CLINICAL CONDITION.   Altamese Dilling M.D on 10/08/2014   Between 7am to 6pm - Pager - [phone]  After 6pm go to [email or web address] - password [email or web address] [facility]  [place] [facility]  Office  [phone]  CC: Primary care physician; Corky Downs, MD

## 2014-10-08 NOTE — Progress Notes (Signed)
Patient ID: Johnny Norman, male   DOB: 1925/03/18, 81 y.o.   MRN: [id number]   [facility]   PATIENT NAME: Johnny Norman    MR#:  [id number]  DATE OF BIRTH:  1926/01/08  SUBJECTIVE:  He is feeling much less pain. He feels less distended. No nausea no vomiting. passage of flatus. He has just returned from x-ray.  REVIEW OF SYSTEMS:   Review of Systems  Constitutional: Negative for fever and chills.  Respiratory: Negative for cough and shortness of breath.   Cardiovascular: Negative for chest pain and palpitations.  Gastrointestinal: Positive for abdominal pain. Negative for heartburn, nausea and vomiting.  Psychiatric/Behavioral: Negative.     DRUG ALLERGIES:  No Known Allergies  VITALS:  Blood pressure 141/74, pulse 80, temperature 98.6 F (37 C), temperature source Oral, resp. rate 16, height  (1.905 m), weight 171 lb 3.2 oz (77.656 kg), SpO2 97 %.  I/O last 3 completed shifts: In: 1127 [I.V.:1127] Out: 650 [Urine:300; Emesis/NG output:350] Total I/O In: -  Out: 50 [Emesis/NG output:50]  Physical Exam  Constitutional: He is oriented to person, place, and time and well-developed, well-nourished, and in no distress. No distress.  HENT:  Head: Normocephalic.  Eyes: Pupils are equal, round, and reactive to light.  Neck: No tracheal deviation present. No thyromegaly present.  Cardiovascular: Normal rate and regular rhythm.   Pulmonary/Chest: Effort normal and breath sounds normal.  Abdominal: Soft. He exhibits no mass. There is no tenderness. There is no rebound and no guarding.  Neurological: He is oriented to person, place, and time.  Skin: He is not diaphoretic.  Psychiatric: Affect and judgment normal.   Review of plain films of the abdomen demonstrates dilated loops of small bowel. There is some air within the colon. There is no contrast seen within the colon in 24 hours.  ASSESSMENT AND PLAN:   Small bowel obstruction in an elderly man  with likely from adhesions. Coumadin effect. sick sinus syndrome. At present I do not see an indication for surgical intervention. Continue nasogastric tube decompression.  I discussed with him the indications for surgery. Namely failure to progress. Worsening of x-rays. He understands and wishes to proceed currently with nasogastric tube decompression which he seems to be tolerating well.  Appreciate internal medicine input. I will check a pro-time in the morning.

## 2014-10-08 NOTE — Progress Notes (Signed)
Patient's Estimated CrCl= 48 ml/min. Patient currently has order of Enoxaparin 30 mg SQ daily. Will change to Enoxaparin 40 mg SQ daily per policy.   Demetrius Charity, PharmD

## 2014-10-08 NOTE — Progress Notes (Signed)
Patient is a/o had complaints of nausea, gave phenergan at 1716 no relief noted, vomiting occurred afterwards. Checked NG tube placement and length, advanced to 80cm. Rehooked to suction, noticed bright red drainage. Stopped suction and notified MD. Received order for KUB and to continue suction. Nursing will continue to monitor.

## 2014-10-09 ENCOUNTER — Inpatient Hospital Stay: Payer: Medicare Other

## 2014-10-09 LAB — BASIC METABOLIC PANEL
Anion gap: 6 (ref 5–15)
BUN: 16 mg/dL (ref 6–20)
CHLORIDE: 104 mmol/L (ref 101–111)
CO2: 27 mmol/L (ref 22–32)
CREATININE: 0.95 mg/dL (ref 0.61–1.24)
Calcium: 8.7 mg/dL — ABNORMAL LOW (ref 8.9–10.3)
GFR calc Af Amer: >60 mL/min (ref >60)
GFR calc non Af Amer: >60 mL/min (ref >60)
Glucose, Bld: 130 mg/dL — ABNORMAL HIGH (ref 65–99)
Potassium: 3.9 mmol/L (ref 3.5–5.1)
SODIUM: 137 mmol/L (ref 135–145)

## 2014-10-09 LAB — PROTIME-INR
INR: 1.98
PROTHROMBIN TIME: 22.7 s — AB (ref 11.4–15.0)

## 2014-10-09 LAB — CBC
HCT: 42.9 % (ref 40.0–52.0)
HEMOGLOBIN: 14.4 g/dL (ref 13.0–18.0)
MCH: 29.6 pg (ref 26.0–34.0)
MCHC: 33.6 g/dL (ref 32.0–36.0)
MCV: 88.1 fL (ref 80.0–100.0)
Platelets: 168 10*3/uL (ref 150–440)
RBC: 4.87 MIL/uL (ref 4.40–5.90)
RDW: 13.3 % (ref 11.5–14.5)
WBC: 14.4 10*3/uL — ABNORMAL HIGH (ref 3.8–10.6)

## 2014-10-09 MED ORDER — MORPHINE SULFATE (PF) 2 MG/ML IV SOLN
2.0000 mg | INTRAVENOUS | Status: DC | PRN
  Administered 2014-10-09 – 2014-10-10 (×4): 2 mg via INTRAVENOUS
  Filled 2014-10-09 (×4): qty 1

## 2014-10-09 MED ORDER — PHENOL 1.4 % MT LIQD
1.0000 | OROMUCOSAL | Status: DC | PRN
  Administered 2014-10-09 (×3): 1 via OROMUCOSAL
  Filled 2014-10-09: qty 177

## 2014-10-09 MED ORDER — PROMETHAZINE HCL 25 MG/ML IJ SOLN
12.5000 mg | Freq: Four times a day (QID) | INTRAMUSCULAR | Status: DC | PRN
  Administered 2014-10-11 (×2): 12.5 mg via INTRAVENOUS
  Filled 2014-10-09 (×2): qty 1

## 2014-10-09 MED ORDER — ONDANSETRON HCL 4 MG/2ML IJ SOLN
4.0000 mg | Freq: Four times a day (QID) | INTRAMUSCULAR | Status: DC | PRN
  Administered 2014-10-09 – 2014-10-10 (×4): 4 mg via INTRAVENOUS
  Filled 2014-10-09 (×4): qty 2

## 2014-10-09 NOTE — Progress Notes (Addendum)
Patient had an episode of vomiting at 0330 and was given 12.5mg  of phenergan PO. Medication was ineffective. MD Tonita Cong was notified and gave new orders for Zofran  Q6 PRN and Phenergan 12.5mg  Q6 PRN. Patient was given  of Zofran around 0600 and at 0645 stated the nausea was coming and going. Patient also having c/o pain but does not want Dilaudid. MD Tonita Cong gave new order for Morphine  Q4 PRN.

## 2014-10-09 NOTE — Progress Notes (Signed)
Johnny Norman is a 81 y.o. male patient with hx of Afib and sick sinus syndrome, with SBO.  Patient had some clogging of the NG tube overnight with emesis around the tube.  Once advanced last night and flushed and iv nausea medications he has greatly improved.  Patient denies the intermittent cramping pain and states that he has passed some flatus this AM.  He does still feel bloated.    1. Small bowel obstruction   2. Encounter for nasogastric (NG) tube placement   3. Small bowel obstruction due to adhesions   4. Nausea     Past Medical History  Diagnosis Date  . Claudication     leg  . A-fib   . Thyroid disease   . Hyperlipidemia   . CHF (congestive heart failure)     Current Facility-Administered Medications  Medication Dose Route Frequency Provider Last Rate Last Dose  . acetaminophen (TYLENOL) tablet 650 mg  650 mg Oral Q6H PRN Natale Lay, MD       Or  . acetaminophen (TYLENOL) suppository 650 mg  650 mg Rectal Q6H PRN Natale Lay, MD      . aspirin EC tablet 81 mg  81 mg Oral Daily Natale Lay, MD   81 mg at 10/08/14 0930  . atorvastatin (LIPITOR) tablet 10 mg  10 mg Oral Daily Natale Lay, MD   10 mg at 10/08/14 0930  . dextrose 5 % and 0.45 % NaCl with KCl 20 mEq/L infusion   Intravenous Continuous Natale Lay, MD 125 mL/hr at 10/08/14 1841    . enoxaparin (LOVENOX) injection 40 mg  40 mg Subcutaneous Q24H Natale Lay, MD   40 mg at 10/08/14 1636  . hydrALAZINE (APRESOLINE) injection 10 mg  10 mg Intravenous Q6H PRN Altamese Dilling, MD      . HYDROmorphone (DILAUDID) injection 0.5 mg  0.5 mg Intravenous Q2H PRN Natale Lay, MD   0.5 mg at 10/08/14 1840  . levothyroxine (SYNTHROID, LEVOTHROID) tablet 88 mcg  88 mcg Oral QAC breakfast Natale Lay, MD   88 mcg at 10/08/14 0930  . menthol-cetylpyridinium (CEPACOL) lozenge 3 mg  1 lozenge Oral PRN Natale Lay, MD   3 mg at 10/08/14 1718  . mirabegron ER (MYRBETRIQ) tablet 25 mg  25 mg Oral Daily Natale Lay, MD   25 mg at 10/08/14 0931  .  morphine 2 MG/ML injection 2 mg  2 mg Intravenous Q4H PRN Ricarda Frame, MD   2 mg at 10/09/14 0902  . nadolol (CORGARD) tablet 20 mg  20 mg Oral Daily Natale Lay, MD   20 mg at 10/08/14 0930  . ondansetron (ZOFRAN) injection 4 mg  4 mg Intravenous Q6H PRN Ricarda Frame, MD   4 mg at 10/09/14 [phone]  . phenol (CHLORASEPTIC) mouth spray 1 spray  1 spray Mouth/Throat PRN Gladis Riffle, MD      . promethazine (PHENERGAN) injection 12.5 mg  12.5 mg Intravenous Q6H PRN Ricarda Frame, MD      . promethazine (PHENERGAN) tablet 12.5 mg  12.5 mg Oral Q6H PRN Natale Lay, MD   12.5 mg at 10/09/14 0351   No Known Allergies Principal Problem:   Small bowel obstruction due to adhesions  Blood pressure 148/86, pulse 72, temperature 98 F (36.7 C), temperature source Oral, resp. rate 18, height  (1.905 m), weight 172 lb 14.4 oz (78.427 kg), SpO2 95 %.  Review of Systems  Constitutional: Positive for malaise/fatigue. Negative for fever and chills.  Gastrointestinal: Positive for heartburn, nausea, vomiting, abdominal pain and constipation. Negative for diarrhea and blood in stool.  Genitourinary: Negative for dysuria and hematuria.  Neurological: Positive for weakness.  All other systems reviewed and are negative.   Physical Exam  Constitutional: He is oriented to person, place, and time. He appears well-developed and well-nourished. No distress.  Cardiovascular: Normal rate, regular rhythm and intact distal pulses.   Pulmonary/Chest: Effort normal and breath sounds normal. No respiratory distress.  Abdominal: Soft. Bowel sounds are normal.  Mild distension, non-tender, normal bowel sounds, NG tube in the left nare with dark bilious material, thick requiring flushing  Musculoskeletal: Normal range of motion. He exhibits no edema.  Neurological: He is alert and oriented to person, place, and time.  Vitals reviewed.  Labs:  CBC Latest Ref Rng 10/09/2014 10/08/2014 10/07/2014  WBC 3.8 - 10.6  K/uL 14.4(H) 11.8(H) 8.1  Hemoglobin 13.0 - 18.0 g/dL 16.1 09.6 04.5  Hematocrit 40.0 - 52.0 % 42.9 41.5 44.6  Platelets 150 - 440 K/uL 168 173 187   CMP Latest Ref Rng 10/09/2014 10/08/2014 10/07/2014  Glucose 65 - 99 mg/dL 409(W) 119(J) 478(G)  BUN 6 - 20 mg/dL 16 16 20   Creatinine 0.61 - 1.24 mg/dL 9.56 2.13 0.86  Sodium 135 - 145 mmol/L 137 137 138  Potassium 3.5 - 5.1 mmol/L 3.9 4.0 4.1  Chloride 101 - 111 mmol/L 104 104 105  CO2 22 - 32 mmol/L 27 27 27   Calcium 8.9 - 10.3 mg/dL 5.7(Q) 4.6(N) 9.4  Total Protein 6.5 - 8.1 g/dL - 5.9(L) 7.0  Albumin 3.5 - 4.7 g/dL - - -  Total Bilirubin 0.3 - 1.2 mg/dL - 6.2(X) 1.3(H)  Alkaline Phos 38 - 126 U/L - 52 71  AST 15 - 41 U/L - 30 34  ALT 17 - 63 U/L - 17 21   Images:  9/12: FINDINGS: NG tube tip is in the proximal stomach near the GE junction. Side port is in the distal esophagus. Position is stable since prior study. Continued mild small bowel distention with scattered air-fluid levels compatible with small bowel obstruction. Overall small bowel distention stable or slightly increased. Moderate stool burden in the colon. Prior cholecystectomy. No organomegaly or free air.  IMPRESSION: Continue small bowel obstruction pattern, stable or slightly worsened since prior study.  Assessment/Plan: 81 yr old with SBO Some improvement this AM after NG tube working now, he has started passing flatus.  The patient would like to avoid surgery if he can, I discussed with him continuing bowel rest, ng tube decompression and IV hydration but if he does not continue to improve that it may result in the need for surgery.  He expressed understanding.  He also was complaining of sore throat from the tube, ordered chloraseptic spray.    Laney Potash Carlis Burnsworth 10/09/2014

## 2014-10-09 NOTE — Progress Notes (Signed)
[facility] - [place] at [facility]   PATIENT NAME: Nephi Savage    MR#:  [id number]  DATE OF BIRTH:  [birth year]  SUBJECTIVE:  CHIEF COMPLAINT:   Chief Complaint  Patient presents with  . Abdominal Pain  . Vomiting   had episode of vomiting this morning- NGT re-adjusted and started having suction again. Have some soreness in throat. Had some flatus in morning, no BM yet. Mild pain continues.  REVIEW OF SYSTEMS:  CONSTITUTIONAL: No fever, fatigue or weakness.  EYES: No blurred or double vision.  EARS, NOSE, AND THROAT: No tinnitus or ear pain.  RESPIRATORY: No cough, shortness of breath, wheezing or hemoptysis.  CARDIOVASCULAR: No chest pain, orthopnea, edema.  GASTROINTESTINAL: No nausea, vomiting, diarrhea or abdominal pain. Having NGT suction. GENITOURINARY: No dysuria, hematuria.  ENDOCRINE: No polyuria, nocturia,  HEMATOLOGY: No anemia, easy bruising or bleeding SKIN: No rash or lesion. MUSCULOSKELETAL: No joint pain or arthritis.   NEUROLOGIC: No tingling, numbness, weakness.  PSYCHIATRY: No anxiety or depression.   ROS  DRUG ALLERGIES:  No Known Allergies  VITALS:  Blood pressure 133/87, pulse 77, temperature 98.9 F (37.2 C), temperature source Oral, resp. rate 17, height  (1.905 m), weight 78.427 kg (172 lb 14.4 oz), SpO2 99 %.  PHYSICAL EXAMINATION:  GENERAL:  81 y.o.-year-old patient lying in the bed with no acute distress.  EYES: Pupils equal, round, reactive to light and accommodation. No scleral icterus. Extraocular muscles intact.  HEENT: Head atraumatic, normocephalic. Oropharynx and nasopharynx clear.  NECK:  Supple, no jugular venous distention. No thyroid enlargement, no tenderness.  LUNGS: Normal breath sounds bilaterally, no wheezing, rales,rhonchi or crepitation. No use of accessory muscles of respiration.  CARDIOVASCULAR: S1, S2 normal. No murmurs, rubs, or gallops.  ABDOMEN: Soft, nontender, milddistended. Sluggish  Bowel sounds present. No organomegaly or mass. NGT suction in place. EXTREMITIES: No pedal edema, cyanosis, or clubbing.  NEUROLOGIC: Cranial nerves II through XII are intact. Muscle strength 5/5 in all extremities. Sensation intact. Gait not checked.  PSYCHIATRIC: The patient is alert and oriented x 3.  SKIN: No obvious rash, lesion, or ulcer.   Physical Exam LABORATORY PANEL:   CBC  Recent Labs Lab 10/09/14 0451  WBC 14.4*  HGB 14.4  HCT 42.9  PLT 168   ------------------------------------------------------------------------------------------------------------------  Chemistries   Recent Labs Lab 10/08/14 0434 10/09/14 0451  NA 137 137  K 4.0 3.9  CL 104 104  CO2 27 27  GLUCOSE 130* 130*  BUN 16 16  CREATININE 1.19 0.95  CALCIUM 8.7* 8.7*  MG 1.8  --   AST 30  --   ALT 17  --   ALKPHOS 52  --   BILITOT 1.4*  --    ------------------------------------------------------------------------------------------------------------------  Cardiac Enzymes  Recent Labs Lab 10/07/14 0703  TROPONINI <0.03   ------------------------------------------------------------------------------------------------------------------  RADIOLOGY:  Dg Abd 1 View  10/08/2014   CLINICAL DATA:  Vomiting.  EXAM: ABDOMEN - 1 VIEW  COMPARISON:  October 08, 2014.  FINDINGS: Status post cholecystectomy. Continued presence of mildly dilated small bowel loops concerning for possible distal small bowel obstruction. Stool is noted in the colon. Phleboliths are noted in the pelvis.  IMPRESSION: Continued presence of mildly dilated small bowel loops concerning for distal small bowel obstruction. Continued radiographic follow-up is recommended.   Electronically Signed   By: Lupita Raider, M.D.   On: 10/08/2014 20:14   Dg Abd 2 Views  10/09/2014   CLINICAL DATA:  Follow-up small bowel  obstruction.  EXAM: ABDOMEN - 2 VIEW  COMPARISON:  10/08/2014  FINDINGS: NG tube tip is in the proximal stomach near  the GE junction. Side port is in the distal esophagus. Position is stable since prior study. Continued mild small bowel distention with scattered air-fluid levels compatible with small bowel obstruction. Overall small bowel distention stable or slightly increased. Moderate stool burden in the colon. Prior cholecystectomy. No organomegaly or free air.  IMPRESSION: Continue small bowel obstruction pattern, stable or slightly worsened since prior study.   Electronically Signed   By: Charlett Nose M.D.   On: 10/09/2014 08:25   Acute Abdominal Series  10/08/2014   CLINICAL DATA:  81 year old male with a history of pain. Nausea and vomiting.  EXAM: DG ABDOMEN ACUTE W/ 1V CHEST  COMPARISON:  Abdominal CT 10/07/2014, chest x-ray 10/07/2014, abdominal plain film 10/07/2014  FINDINGS: Chest:  Cardiomediastinal silhouette within normal limits. Tortuosity descending thoracic aorta. Calcifications aortic arch.  No confluent airspace disease.  No pneumothorax or pleural effusion.  Cardiac pacing device on left chest wall with 2 leads in place.  Changes of emphysema again noted.  Abdomen:  Gastric tube terminates in the midline at the diaphragm, not beyond the GE junction.  Multiple borderline dilated small bowel loops within the mid abdomen. Gas and formed stool within the right colon. Small amount of gas within the sigmoid colon and rectum. No unexpected calcifications or radiopaque foreign body.  Surgical changes of cholecystectomy.  Degenerative changes of the spine.  No displaced fracture.  IMPRESSION: Chest:  No radiographic evidence of acute cardiopulmonary disease.  Abdomen:  Gastric tube terminates the distal esophagus, with the tip not beyond the GE junction. This may be advanced slightly into the stomach for better position.  Multiple borderline dilated small bowel loops indicating ileus or potentially small bowel obstruction. Recommend serial plain film until resolution of this abnormal bowel gas pattern given  findings on prior CT.  Signed,  Yvone Neu. Loreta Ave, DO  [facility]  [facility]   Electronically Signed   By: Gilmer Mor D.O.   On: 10/08/2014 11:49    ASSESSMENT AND PLAN:   * Small bowel obstruction   Manage per primary team.   Conservative management, NGT in place, no plans for surgery at this time.  had vomiting this morning, but after re-adjusting NGT- now again started having drainage.  * Lactic acidosis - likely due to bowel obstruction.  * Hyperlipidemia cont statin.  * A. Fib   On betablocker.    Held coumadin as she may need sx    Still INR is high. Monitor.  * Hypertension   On Naodolol, added hydralazine Inj as needed.   Well controlled.  * BPH  All the records are reviewed and case discussed with Care Management/Social Workerr. Management plans discussed with the patient, family and they are in agreement.  CODE STATUS: Full  TOTAL TIME TAKING CARE OF THIS PATIENT: 30 minutes.  Discussed with his daughter present in room.  POSSIBLE D/C IN 2-3  DAYS, DEPENDING ON CLINICAL CONDITION.   Altamese Dilling M.D on 10/09/2014   Between 7am to 6pm - Pager - [phone]  After 6pm go to [email or web address] - password [email or web address] [facility]  [facility]  Office  [phone]  CC: Primary care physician; Corky Downs, MD

## 2014-10-10 ENCOUNTER — Inpatient Hospital Stay: Payer: Medicare Other

## 2014-10-10 LAB — CBC
HEMATOCRIT: 39.3 % — AB (ref 40.0–52.0)
Hemoglobin: 13.3 g/dL (ref 13.0–18.0)
MCH: 29.9 pg (ref 26.0–34.0)
MCHC: 33.8 g/dL (ref 32.0–36.0)
MCV: 88.4 fL (ref 80.0–100.0)
PLATELETS: 153 10*3/uL (ref 150–440)
RBC: 4.45 MIL/uL (ref 4.40–5.90)
RDW: 13.3 % (ref 11.5–14.5)
WBC: 13.3 10*3/uL — ABNORMAL HIGH (ref 3.8–10.6)

## 2014-10-10 LAB — PROTIME-INR
INR: 1.95
Prothrombin Time: 22.4 seconds — ABNORMAL HIGH (ref 11.4–15.0)

## 2014-10-10 MED ORDER — MORPHINE SULFATE (PF) 2 MG/ML IV SOLN
2.0000 mg | INTRAVENOUS | Status: DC | PRN
  Administered 2014-10-12: 2 mg via INTRAVENOUS
  Filled 2014-10-10 (×2): qty 1

## 2014-10-10 MED ORDER — PANTOPRAZOLE SODIUM 40 MG IV SOLR
40.0000 mg | INTRAVENOUS | Status: DC
  Administered 2014-10-10 – 2014-10-16 (×7): 40 mg via INTRAVENOUS
  Filled 2014-10-10 (×7): qty 40

## 2014-10-10 NOTE — Progress Notes (Signed)
Pt was given AM meds with sip of water.  A  few minutes after  medication administration pt started to  cough up clear thick mucus. Pt then stated that he felt like his through was closing up and was concerned that it was an adverse effect from the midication. After RN assess pt; pt was found to have NG tube coiled in back of throat. Tube was removed and Dr. Gevena Mart and Dr. Merrilyn Puma notified. Orders were received to put NG tube back in. NG tube reinserted without difficulty.

## 2014-10-10 NOTE — Progress Notes (Signed)
[facility] - [place] at [facility]   PATIENT NAME: Johnny Norman    MR#:  [id number]  DATE OF BIRTH:  10-24-1925  SUBJECTIVE:  CHIEF COMPLAINT:   Chief Complaint  Patient presents with  . Abdominal Pain  . Vomiting    Still have a lot of secretion suctioned from NGT, passed some gas.  REVIEW OF SYSTEMS:  CONSTITUTIONAL: No fever, fatigue or weakness.  EYES: No blurred or double vision.  EARS, NOSE, AND THROAT: No tinnitus or ear pain.  RESPIRATORY: No cough, shortness of breath, wheezing or hemoptysis.  CARDIOVASCULAR: No chest pain, orthopnea, edema.  GASTROINTESTINAL: No nausea, vomiting, diarrhea or abdominal pain. Having NGT suction. GENITOURINARY: No dysuria, hematuria.  ENDOCRINE: No polyuria, nocturia,  HEMATOLOGY: No anemia, easy bruising or bleeding SKIN: No rash or lesion. MUSCULOSKELETAL: No joint pain or arthritis.   NEUROLOGIC: No tingling, numbness, weakness.  PSYCHIATRY: No anxiety or depression.   ROS  DRUG ALLERGIES:  No Known Allergies  VITALS:  Blood pressure 153/80, pulse 72, temperature 98.7 F (37.1 C), temperature source Oral, resp. rate 16, height  (1.905 m), weight 77.202 kg (170 lb 3.2 oz), SpO2 95 %.  PHYSICAL EXAMINATION:  GENERAL:  81 y.o.-year-old patient lying in the bed with no acute distress.  EYES: Pupils equal, round, reactive to light and accommodation. No scleral icterus. Extraocular muscles intact.  HEENT: Head atraumatic, normocephalic. Oropharynx and nasopharynx clear.  NECK:  Supple, no jugular venous distention. No thyroid enlargement, no tenderness.  LUNGS: Normal breath sounds bilaterally, no wheezing, rales,rhonchi or crepitation. No use of accessory muscles of respiration.  CARDIOVASCULAR: S1, S2 normal. No murmurs, rubs, or gallops.  ABDOMEN: Soft, nontender, milddistended. Sluggish Bowel sounds present. No organomegaly or mass. NGT suction in place. EXTREMITIES: No pedal edema, cyanosis,  or clubbing.  NEUROLOGIC: Cranial nerves II through XII are intact. Muscle strength 5/5 in all extremities. Sensation intact. Gait not checked.  PSYCHIATRIC: The patient is alert and oriented x 3.  SKIN: No obvious rash, lesion, or ulcer.   Physical Exam LABORATORY PANEL:   CBC  Recent Labs Lab 10/10/14 0434  WBC 13.3*  HGB 13.3  HCT 39.3*  PLT 153   ------------------------------------------------------------------------------------------------------------------  Chemistries   Recent Labs Lab 10/08/14 0434 10/09/14 0451  NA 137 137  K 4.0 3.9  CL 104 104  CO2 27 27  GLUCOSE 130* 130*  BUN 16 16  CREATININE 1.19 0.95  CALCIUM 8.7* 8.7*  MG 1.8  --   AST 30  --   ALT 17  --   ALKPHOS 52  --   BILITOT 1.4*  --    ------------------------------------------------------------------------------------------------------------------  Cardiac Enzymes  Recent Labs Lab 10/07/14 0703  TROPONINI <0.03   ------------------------------------------------------------------------------------------------------------------  RADIOLOGY:  Dg Abd 2 Views  10/10/2014   CLINICAL DATA:  Small bowel obstruction.  EXAM: ABDOMEN - 2 VIEW  COMPARISON:  October 09, 2014.  FINDINGS: Stable dilated small bowel loops are noted consistent with distal small bowel obstruction. Status post cholecystectomy. No pneumoperitoneum is noted. Nasogastric tube tip is seen at the gastroesophageal junction.  IMPRESSION: Stable dilated small bowel loops are noted concerning for distal small bowel obstruction.   Electronically Signed   By: Lupita Raider, M.D.   On: 10/10/2014 11:47   Dg Abd 2 Views  10/09/2014   CLINICAL DATA:  Follow-up small bowel obstruction.  EXAM: ABDOMEN - 2 VIEW  COMPARISON:  10/08/2014  FINDINGS: NG tube tip is in the proximal stomach  near the GE junction. Side port is in the distal esophagus. Position is stable since prior study. Continued mild small bowel distention with scattered  air-fluid levels compatible with small bowel obstruction. Overall small bowel distention stable or slightly increased. Moderate stool burden in the colon. Prior cholecystectomy. No organomegaly or free air.  IMPRESSION: Continue small bowel obstruction pattern, stable or slightly worsened since prior study.   Electronically Signed   By: Charlett Nose M.D.   On: 10/09/2014 08:25    ASSESSMENT AND PLAN:   * Small bowel obstruction   Manage per primary team.   Conservative management, NGT in place, no plans for surgery at this time.   Still cont to have a lot of secretions from NGT.  * Lactic acidosis - likely due to bowel obstruction.    Better now.  * Hyperlipidemia hold statin, as NPO now.  * A. Fib   On betablocker.    Held coumadin as she may need sx    Still INR is high. Monitor.  * Hypertension   On Naodolol, added hydralazine Inj as needed.   Well controlled.  * BPH  All the records are reviewed and case discussed with Care Management/Social Workerr. Management plans discussed with the patient, family and they are in agreement.  CODE STATUS: Full  TOTAL TIME TAKING CARE OF THIS PATIENT: 30 minutes.  Discussed with his daughter present in room.  POSSIBLE D/C IN 2-3  DAYS, DEPENDING ON CLINICAL CONDITION.   Altamese Dilling M.D on 10/10/2014   Between 7am to 6pm - Pager - [phone]  After 6pm go to [email or web address] - password [email or web address] [facility]  [facility]  Office  [phone]  CC: Primary care physician; Corky Downs, MD

## 2014-10-10 NOTE — Progress Notes (Signed)
Johnny Norman is a 81 y.o. male with SBO.  Patient doing better this AM, complaining more of sore throat.  Denies any nausea, vomiting or abdominal pain since yesterday.  Patient states passing some flatus and feels as if he could have a BM.    Filed Vitals:   10/10/14 0539  BP: 138/73  Pulse: 72  Temp: 98.7 F (37.1 C)  Resp: 18   General appearance: alert, cooperative and appears stated age Lungs: clear to auscultation bilaterally Heart: regular rate and rhythm Abdomen: soft, non tender, mildly distended Extremities: 2+pulses, no edema   I/O last 3 completed shifts: In: 2163 [I.V.:2003; NG/GT:160] Out: 1429 [Urine:401; Emesis/NG output:1027; Stool:1] Total I/O In: 910 [I.V.:880; NG/GT:30] Out: 300 [Emesis/NG output:300]    Labs:  CBC Latest Ref Rng 10/10/2014 10/09/2014 10/08/2014  WBC 3.8 - 10.6 K/uL 13.3(H) 14.4(H) 11.8(H)  Hemoglobin 13.0 - 18.0 g/dL 16.1 09.6 04.5  Hematocrit 40.0 - 52.0 % 39.3(L) 42.9 41.5  Platelets 150 - 440 K/uL 153 168 173   CMP Latest Ref Rng 10/09/2014 10/08/2014 10/07/2014  Glucose 65 - 99 mg/dL 409(W) 119(J) 478(G)  BUN 6 - 20 mg/dL Creatinine 0.61 - 1.24 mg/dL 9.56 2.13 0.86  Sodium 135 - 145 mmol/L 137 137 138  Potassium 3.5 - 5.1 mmol/L 3.9 4.0 4.1  Chloride 101 - 111 mmol/L 104 104 105  CO2 22 - 32 mmol/L Calcium 8.9 - 10.3 mg/dL 5.7(Q) 4.6(N) 9.4  Total Protein 6.5 - 8.1 g/dL - 5.9(L) 7.0  Albumin 3.5 - 4.7 g/dL - - -  Total Bilirubin 0.3 - 1.2 mg/dL - 6.2(X) 1.3(H)  Alkaline Phos 38 - 126 U/L - 52 71  AST 15 - 41 U/L - 30 34  ALT 17 - 63 U/L - 17 21   Assessment/Plan:  81 y.o. male with Small Bowel Obstruction. Patient clinically improving, WBC improving, still with high NG tube output, would continue to suction Encourage ambulation  Will continue to monitor for worsening exam, and will repeat Abdominal xray today to evaluate gas pattern

## 2014-10-10 NOTE — Progress Notes (Signed)
Called Dr. Juliann Pulse regarding patient's nausea.  Doctor told me to alternate phenergan and zofran every three hours as needed to relieve nausea.  Johnny Norman  10/10/2014  11:48 PM

## 2014-10-11 ENCOUNTER — Inpatient Hospital Stay: Payer: Medicare Other

## 2014-10-11 LAB — BASIC METABOLIC PANEL
ANION GAP: 4 — AB (ref 5–15)
BUN: 12 mg/dL (ref 6–20)
CHLORIDE: 105 mmol/L (ref 101–111)
CO2: 25 mmol/L (ref 22–32)
CREATININE: 0.93 mg/dL (ref 0.61–1.24)
Calcium: 8.1 mg/dL — ABNORMAL LOW (ref 8.9–10.3)
GFR calc non Af Amer: >60 mL/min (ref >60)
Glucose, Bld: 128 mg/dL — ABNORMAL HIGH (ref 65–99)
POTASSIUM: 3.9 mmol/L (ref 3.5–5.1)
SODIUM: 134 mmol/L — AB (ref 135–145)

## 2014-10-11 MED ORDER — ONDANSETRON HCL 4 MG/2ML IJ SOLN
4.0000 mg | INTRAMUSCULAR | Status: DC | PRN
  Administered 2014-10-12: 4 mg via INTRAVENOUS

## 2014-10-11 NOTE — Care Management Important Message (Signed)
Important Message  Patient Details  Name: KELLYN MANSFIELD MRN: [id number] Date of Birth: [birth year]   Medicare Important Message Given:  Yes-third notification given    Olegario Messier A Allmond 10/11/2014, 10:23 AM

## 2014-10-11 NOTE — Progress Notes (Signed)
[facility] - [place] at [facility]   PATIENT NAME: Johnny Norman    MR#:  [id number]  DATE OF BIRTH:  29-Jul-1925  SUBJECTIVE:  CHIEF COMPLAINT:   Chief Complaint  Patient presents with  . Abdominal Pain  . Vomiting    Continues to have nausea. Significant weakness. High NG tube output. Afebrile. No bleeding.  REVIEW OF SYSTEMS:  CONSTITUTIONAL: No fever, fatigue or weakness.  EYES: No blurred or double vision.  EARS, NOSE, AND THROAT: No tinnitus or ear pain.  RESPIRATORY: No cough, shortness of breath, wheezing or hemoptysis.  CARDIOVASCULAR: No chest pain, orthopnea, edema.  GASTROINTESTINAL: No nausea, vomiting, diarrhea or abdominal pain. Having NGT suction. GENITOURINARY: No dysuria, hematuria.  ENDOCRINE: No polyuria, nocturia,  HEMATOLOGY: No anemia, easy bruising or bleeding SKIN: No rash or lesion. MUSCULOSKELETAL: No joint pain or arthritis.   NEUROLOGIC: No tingling, numbness, weakness.  PSYCHIATRY: No anxiety or depression.   ROS  DRUG ALLERGIES:  No Known Allergies  VITALS:  Blood pressure 154/81, pulse 72, temperature 97.7 F (36.5 C), temperature source Oral, resp. rate 19, height 6\' 3"  (1.905 m), weight 80.287 kg (177 lb), SpO2 97 %.  PHYSICAL EXAMINATION:  GENERAL:  81 y.o.-year-old patient lying in the bed with no acute distress.  EYES: Pupils equal, round, reactive to light and accommodation. No scleral icterus. Extraocular muscles intact.  HEENT: Head atraumatic, normocephalic. Oropharynx and nasopharynx clear.  NECK:  Supple, no jugular venous distention. No thyroid enlargement, no tenderness.  LUNGS: Normal breath sounds bilaterally, no wheezing, rales,rhonchi or crepitation. No use of accessory muscles of respiration.  CARDIOVASCULAR: S1, S2 normal. No murmurs, rubs, or gallops.  ABDOMEN: Soft, nontender, milddistended. Sluggish Bowel sounds present. No organomegaly or mass. NGT suction in place. EXTREMITIES: No  pedal edema, cyanosis, or clubbing.  NEUROLOGIC: Cranial nerves II through XII are intact. Muscle strength 5/5 in all extremities. Sensation intact. Gait not checked.  PSYCHIATRIC: The patient is alert and oriented x 3.  SKIN: No obvious rash, lesion, or ulcer.   Physical Exam LABORATORY PANEL:   CBC  Recent Labs Lab 10/10/14 0434  WBC 13.3*  HGB 13.3  HCT 39.3*  PLT 153   ------------------------------------------------------------------------------------------------------------------  Chemistries   Recent Labs Lab 10/08/14 0434  10/11/14 0608  NA 137  < > 134*  K 4.0  < > 3.9  CL 104  < > 105  CO2 27  < > 25  GLUCOSE 130*  < > 128*  BUN 16  < > 12  CREATININE 1.19  < > 0.93  CALCIUM 8.7*  < > 8.1*  MG 1.8  --   --   AST 30  --   --   ALT 17  --   --   ALKPHOS 52  --   --   BILITOT 1.4*  --   --   < > = values in this interval not displayed. ------------------------------------------------------------------------------------------------------------------  Cardiac Enzymes  Recent Labs Lab 10/07/14 0703  TROPONINI <0.03   ------------------------------------------------------------------------------------------------------------------  RADIOLOGY:  Dg Abd 2 Views  10/10/2014   CLINICAL DATA:  Small bowel obstruction.  EXAM: ABDOMEN - 2 VIEW  COMPARISON:  October 09, 2014.  FINDINGS: Stable dilated small bowel loops are noted consistent with distal small bowel obstruction. Status post cholecystectomy. No pneumoperitoneum is noted. Nasogastric tube tip is seen at the gastroesophageal junction.  IMPRESSION: Stable dilated small bowel loops are noted concerning for distal small bowel obstruction.   Electronically Signed  By: Lupita Raider, M.D.   On: 10/10/2014 11:47    ASSESSMENT AND PLAN:   * Small bowel obstruction   Manage per surgery team.   Conservative management, NGT in place, no plans for surgery at this time.  Will await repeat abd xray  results. Coumadin held if surgery is needed.  * Lactic acidosis - likely due to bowel obstruction.    Better now.  * Hyperlipidemia hold statin, as NPO now.  * A. Fib   On betablocker.    Held coumadin as she may need sx    Repeat INR in AM.  * Hypertension   On Naodolol, added hydralazine Inj as needed.   Well controlled.   * BPH  All the records are reviewed and case discussed with Care Management/Social Workerr. Management plans discussed with the patient, family and they are in agreement.  CODE STATUS: Full  TOTAL TIME TAKING CARE OF THIS PATIENT: 30 minutes.   Discussed with patient and family present in room.    Milagros Loll R M.D on 10/11/2014   Between 7am to 6pm - Pager - [phone]  After 6pm go to [email or web address] - password [email or web address] [facility]  [facility]  Office  [phone]  CC: Primary care physician; Corky Downs, MD

## 2014-10-11 NOTE — Progress Notes (Signed)
Johnny Norman is a 81 y.o. male patient with hx of Afib and sick sinus syndrome, with SBO. He has began to feel worse throughout the course of the day.  He has passed flatus once but does endorse feeling more bloated and having the hiccups.  He still has some intermittent cramping abdominal pains and nausea.  1. Small bowel obstruction   2. Encounter for nasogastric (NG) tube placement   3. Small bowel obstruction due to adhesions   4. Nausea   5. SBO (small bowel obstruction)    Past Medical History  Diagnosis Date  . Claudication     leg  . A-fib   . Thyroid disease   . Hyperlipidemia   . CHF (congestive heart failure)    Current Facility-Administered Medications  Medication Dose Route Frequency Provider Last Rate Last Dose  . acetaminophen (TYLENOL) tablet 650 mg  650 mg Oral Q6H PRN Johnny Lay, MD       Or  . acetaminophen (TYLENOL) suppository 650 mg  650 mg Rectal Q6H PRN Johnny Lay, MD      . dextrose 5 % and 0.45 % Johnny Norman with Johnny Norman 20 mEq/L infusion   Intravenous Continuous Johnny Lay, MD 125 mL/hr at 10/11/14 1533    . enoxaparin (LOVENOX) injection 40 mg  40 mg Subcutaneous Q24H Johnny Lay, MD   40 mg at 10/10/14 1722  . hydrALAZINE (APRESOLINE) injection 10 mg  10 mg Intravenous Q6H PRN Johnny Dilling, MD      . HYDROmorphone (DILAUDID) injection 0.5 mg  0.5 mg Intravenous Q2H PRN Johnny Lay, MD   0.5 mg at 10/08/14 1840  . menthol-cetylpyridinium (CEPACOL) lozenge 3 mg  1 lozenge Oral PRN Johnny Lay, MD   3 mg at 10/09/14 1116  . mirabegron ER (MYRBETRIQ) tablet 25 mg  25 mg Oral Daily Johnny Lay, MD   25 mg at 10/10/14 1214  . morphine 2 MG/ML injection 2 mg  2 mg Intravenous Q4H PRN Johnny Dilling, MD      . ondansetron (ZOFRAN) injection 4 mg  4 mg Intravenous Q4H PRN Johnny Riffle, MD      . pantoprazole (PROTONIX) injection 40 mg  40 mg Intravenous Q24H Johnny Dilling, MD   40 mg at 10/11/14 1158  . phenol (CHLORASEPTIC) mouth spray 1 spray  1  spray Mouth/Throat PRN Johnny Riffle, MD   1 spray at 10/09/14 1733  . promethazine (PHENERGAN) injection 12.5 mg  12.5 mg Intravenous Q6H PRN Johnny Frame, MD   12.5 mg at 10/11/14 0301  . promethazine (PHENERGAN) tablet 12.5 mg  12.5 mg Oral Q6H PRN Johnny Lay, MD   12.5 mg at 10/09/14 0351   No Known Allergies Principal Problem:   Small bowel obstruction due to adhesions  Blood pressure 154/81, pulse 72, temperature 97.7 F (36.5 C), temperature source Oral, resp. rate 19, height 6\' 3"  (1.905 m), weight 177 lb (80.287 kg), SpO2 97 %.  Subjective: Symptoms:  Worsening.   Diet:  NPO.  He reports  nausea.  No vomiting.   Activity level: Normal with assistance.   Pain:  He complains of pain that is moderate.  He reports pain is worsening.  Pain is partially controlled and requiring pain medication.    Objective: General Appearance:  In no acute distress and uncomfortable.   Vital signs: (most recent): Blood pressure 154/81, pulse 72, temperature 97.7 F (36.5 C), temperature source Oral, resp. rate 19, height 6\' 3"  (1.905 m), weight 177 lb (80.287  kg), SpO2 97 %.  Vital signs are normal.  No fever.   Output: Producing urine and no stool output.   Lungs:  Normal respiratory rate and normal effort.  Breath sounds clear to auscultation.   Heart: Irregular rhythm.  No murmur or gallop.  Extremities: Normal range of motion.  There is no deformity or dependent edema.   Neurological: Patient is alert and oriented to person, place and time.   Skin:  Warm and dry.   Abdomen: Abdomen is soft and distended.  (Some minimal high pitched bowel sounds, soft but now moderately distended, some mild pain in the lower abdomen. ) Pulses: Distal pulses are intact.  There are no decreased pulses.    Assessment: ( 81 y.o. male patient with hx of Afib and sick sinus syndrome, with SBO. Small bowel obstruction: not improving at this time, worsening and feels malaise today, still with high NG tube  output and KUB with many air fluid levels.  Given that he is not improving over several days discussed with family the options of waiting a few more days of conservative management verses operation.  I discussed the risks, benefits, alternatives and expected outcomes of Exploratory Laparotomy including the risk for hear attack, stroke, adverse reaction to anesthesia, confusion, need for higher level of care, bleeding, infection, damage to surrounding structures, formation of scar tissue in the future, not fully relieving the obstruction and possible need for rehab or nursing facility placement after operation.  Patient and daughter given opportunity to ask questions and have them answered.  Will plan for OR tomorrow AM.  ).   I spent 60 minutes on the patient care with greater than 50% of that time being patient and family discussing pathology, outcomes, education and counseling and coordination of care.   Johnny Norman Johnny Norman 10/11/2014

## 2014-10-12 ENCOUNTER — Encounter: Payer: Self-pay | Admitting: Anesthesiology

## 2014-10-12 ENCOUNTER — Encounter
Admission: EM | Disposition: A | Discharge status: Skilled Nursing Facility | Payer: Self-pay | Source: Home / Self Care | Attending: Surgery

## 2014-10-12 ENCOUNTER — Inpatient Hospital Stay: Payer: Medicare Other | Admitting: Anesthesiology

## 2014-10-12 HISTORY — PX: LAPAROTOMY: SHX154

## 2014-10-12 HISTORY — PX: BOWEL RESECTION: SHX1257

## 2014-10-12 LAB — CBC
HCT: 39.9 % — ABNORMAL LOW (ref 40.0–52.0)
Hemoglobin: 13.4 g/dL (ref 13.0–18.0)
MCH: 30.1 pg (ref 26.0–34.0)
MCHC: 33.6 g/dL (ref 32.0–36.0)
MCV: 89.7 fL (ref 80.0–100.0)
PLATELETS: 174 10*3/uL (ref 150–440)
RBC: 4.44 MIL/uL (ref 4.40–5.90)
RDW: 13.2 % (ref 11.5–14.5)
WBC: 9.6 10*3/uL (ref 3.8–10.6)

## 2014-10-12 LAB — BASIC METABOLIC PANEL
Anion gap: 4 — ABNORMAL LOW (ref 5–15)
BUN: 10 mg/dL (ref 6–20)
CO2: 25 mmol/L (ref 22–32)
CREATININE: 0.97 mg/dL (ref 0.61–1.24)
Calcium: 7.8 mg/dL — ABNORMAL LOW (ref 8.9–10.3)
Chloride: 106 mmol/L (ref 101–111)
GFR calc Af Amer: >60 mL/min (ref >60)
GLUCOSE: 115 mg/dL — AB (ref 65–99)
POTASSIUM: 3.6 mmol/L (ref 3.5–5.1)
Sodium: 135 mmol/L (ref 135–145)

## 2014-10-12 LAB — PROTIME-INR
INR: 1.25
Prothrombin Time: 15.9 seconds — ABNORMAL HIGH (ref 11.4–15.0)

## 2014-10-12 LAB — SURGICAL PCR SCREEN
MRSA, PCR: NEGATIVE
STAPHYLOCOCCUS AUREUS: POSITIVE — AB

## 2014-10-12 SURGERY — LAPAROTOMY, EXPLORATORY
Anesthesia: General | Wound class: Clean Contaminated

## 2014-10-12 MED ORDER — FENTANYL CITRATE (PF) 100 MCG/2ML IJ SOLN
25.0000 ug | INTRAMUSCULAR | Status: DC | PRN
  Administered 2014-10-12: 25 ug via INTRAVENOUS
  Administered 2014-10-12: 50 ug via INTRAVENOUS
  Administered 2014-10-12: 25 ug via INTRAVENOUS

## 2014-10-12 MED ORDER — KETOROLAC TROMETHAMINE 15 MG/ML IJ SOLN
15.0000 mg | Freq: Four times a day (QID) | INTRAMUSCULAR | Status: AC
  Administered 2014-10-12 – 2014-10-17 (×18): 15 mg via INTRAVENOUS
  Filled 2014-10-12 (×19): qty 1

## 2014-10-12 MED ORDER — ENOXAPARIN SODIUM 30 MG/0.3ML ~~LOC~~ SOLN
30.0000 mg | Freq: Two times a day (BID) | SUBCUTANEOUS | Status: DC
  Administered 2014-10-12 – 2014-10-18 (×13): 30 mg via SUBCUTANEOUS
  Filled 2014-10-12 (×13): qty 0.3

## 2014-10-12 MED ORDER — PROPOFOL 10 MG/ML IV BOLUS
INTRAVENOUS | Status: DC | PRN
  Administered 2014-10-12 (×2): 100 mg via INTRAVENOUS

## 2014-10-12 MED ORDER — 0.9 % SODIUM CHLORIDE (POUR BTL) OPTIME
TOPICAL | Status: DC | PRN
  Administered 2014-10-12: 800 mL

## 2014-10-12 MED ORDER — PROMETHAZINE HCL 25 MG/ML IJ SOLN: 6.2500 mg | Freq: Four times a day (QID) | INTRAMUSCULAR | Status: DC | PRN

## 2014-10-12 MED ORDER — HYDROMORPHONE HCL 1 MG/ML IJ SOLN
0.2500 mg | INTRAMUSCULAR | Status: DC | PRN
  Administered 2014-10-12: 0.5 mg via INTRAVENOUS
  Administered 2014-10-12 (×2): 0.25 mg via INTRAVENOUS
  Administered 2014-10-12: 0.5 mg via INTRAVENOUS

## 2014-10-12 MED ORDER — DEXTROSE 5 % IV SOLN
2.0000 g | INTRAVENOUS | Status: DC | PRN
  Administered 2014-10-12: 2 g via INTRAVENOUS

## 2014-10-12 MED ORDER — GLYCOPYRROLATE 0.2 MG/ML IJ SOLN
INTRAMUSCULAR | Status: DC | PRN
  Administered 2014-10-12: .5 mg via INTRAVENOUS

## 2014-10-12 MED ORDER — NEOSTIGMINE METHYLSULFATE 10 MG/10ML IV SOLN
INTRAVENOUS | Status: DC | PRN
  Administered 2014-10-12: 2.5 mg via INTRAVENOUS

## 2014-10-12 MED ORDER — FENTANYL CITRATE (PF) 100 MCG/2ML IJ SOLN
INTRAMUSCULAR | Status: DC | PRN
  Administered 2014-10-12: 50 ug via INTRAVENOUS

## 2014-10-12 MED ORDER — NALOXONE HCL 0.4 MG/ML IJ SOLN: 0.4000 mg | INTRAMUSCULAR | Status: DC | PRN

## 2014-10-12 MED ORDER — SODIUM CHLORIDE 0.9 % IJ SOLN: 9.0000 mL | INTRAMUSCULAR | Status: DC | PRN

## 2014-10-12 MED ORDER — MORPHINE SULFATE 1 MG/ML IV SOLN
INTRAVENOUS | Status: DC
  Administered 2014-10-12: 4 mg via INTRAVENOUS
  Administered 2014-10-12: 14:00:00 via INTRAVENOUS
  Administered 2014-10-13: 2 mg via INTRAVENOUS
  Administered 2014-10-13: 1 mg via INTRAVENOUS
  Filled 2014-10-12: qty 25

## 2014-10-12 MED ORDER — DEXTROSE 5 % IV SOLN
2.0000 g | Freq: Two times a day (BID) | INTRAVENOUS | Status: AC
  Administered 2014-10-12 (×2): 2 g via INTRAVENOUS
  Filled 2014-10-12 (×2): qty 2

## 2014-10-12 MED ORDER — ROCURONIUM BROMIDE 100 MG/10ML IV SOLN
INTRAVENOUS | Status: DC | PRN
  Administered 2014-10-12: 40 mg via INTRAVENOUS

## 2014-10-12 MED ORDER — EPHEDRINE SULFATE 50 MG/ML IJ SOLN
INTRAMUSCULAR | Status: DC | PRN
  Administered 2014-10-12: 10 mg via INTRAVENOUS

## 2014-10-12 MED ORDER — PHENYLEPHRINE HCL 10 MG/ML IJ SOLN
INTRAMUSCULAR | Status: DC | PRN
  Administered 2014-10-12 (×2): .1 ug via INTRAVENOUS

## 2014-10-12 MED ORDER — LIDOCAINE HCL (CARDIAC) 20 MG/ML IV SOLN
INTRAVENOUS | Status: DC | PRN
  Administered 2014-10-12 (×2): 30 mg via INTRAVENOUS

## 2014-10-12 MED ORDER — SUCCINYLCHOLINE CHLORIDE 20 MG/ML IJ SOLN
INTRAMUSCULAR | Status: DC | PRN
  Administered 2014-10-12: 60 mg via INTRAVENOUS

## 2014-10-12 MED ORDER — MIDAZOLAM HCL 2 MG/2ML IJ SOLN
INTRAMUSCULAR | Status: DC | PRN
  Administered 2014-10-12: 1 mg via INTRAVENOUS

## 2014-10-12 MED ORDER — ONDANSETRON HCL 4 MG/2ML IJ SOLN: 4.0000 mg | Freq: Once | INTRAMUSCULAR | Status: DC | PRN

## 2014-10-12 MED ORDER — ACETAMINOPHEN 650 MG RE SUPP: 650.0000 mg | Freq: Four times a day (QID) | RECTAL | Status: DC | PRN

## 2014-10-12 SURGICAL SUPPLY — 34 items
CANISTER SUCT 1200ML W/VALVE (MISCELLANEOUS) ×4 IMPLANT
CATH TRAY 16F METER LATEX (MISCELLANEOUS) ×4 IMPLANT
CLOSURE WOUND 1/2 X4 (GAUZE/BANDAGES/DRESSINGS)
DRAPE INCISE IOBAN 66X45 STRL (DRAPES) IMPLANT
DRAPE LAPAROTOMY 100X77 ABD (DRAPES) ×4 IMPLANT
DRSG OPSITE POSTOP 4X6 (GAUZE/BANDAGES/DRESSINGS) ×4 IMPLANT
DRSG OPSITE POSTOP 4X8 (GAUZE/BANDAGES/DRESSINGS) ×4 IMPLANT
ELECT CAUTERY BLADE 6.4 (BLADE) ×4 IMPLANT
ELECT CAUTERY NEEDLE TIP 1.0 (MISCELLANEOUS)
ELECTRODE CAUTERY NEDL TIP 1.0 (MISCELLANEOUS) IMPLANT
GAUZE SPONGE 4X4 12PLY STRL (GAUZE/BANDAGES/DRESSINGS) IMPLANT
GLOVE BIO SURGEON STRL SZ7.5 (GLOVE) ×32 IMPLANT
GLOVE INDICATOR 8.0 STRL GRN (GLOVE) ×32 IMPLANT
GOWN STRL REUS W/ TWL LRG LVL3 (GOWN DISPOSABLE) ×16 IMPLANT
GOWN STRL REUS W/TWL LRG LVL3 (GOWN DISPOSABLE) ×16
KIT RM TURNOVER STRD PROC AR (KITS) ×4 IMPLANT
LABEL OR SOLS (LABEL) IMPLANT
LIGASURE 5MM LAPAROSCOPIC (INSTRUMENTS) IMPLANT
NS IRRIG 1000ML POUR BTL (IV SOLUTION) ×4 IMPLANT
PACK BASIN MAJOR ARMC (MISCELLANEOUS) ×4 IMPLANT
PACK COLON CLEAN CLOSURE (MISCELLANEOUS) ×4 IMPLANT
PAD GROUND ADULT SPLIT (MISCELLANEOUS) ×4 IMPLANT
RELOAD PROXIMATE 75MM BLUE (ENDOMECHANICALS) ×4 IMPLANT
STAPLER PROXIMATE 75MM BLUE (STAPLE) ×4 IMPLANT
STAPLER SKIN PROX 35W (STAPLE) ×4 IMPLANT
STRIP CLOSURE SKIN 1/2X4 (GAUZE/BANDAGES/DRESSINGS) IMPLANT
SUT MAXON ABS #0 GS21 30IN (SUTURE) IMPLANT
SUT PDS AB 1 TP1 96 (SUTURE) ×4 IMPLANT
SUT SILK 3-0 (SUTURE) ×6 IMPLANT
SUT SILK 3-0 SH-1 18XCR BRD (SUTURE) ×2
SUT VIC AB 3-0 SH 27 (SUTURE) ×2
SUT VIC AB 3-0 SH 27X BRD (SUTURE) ×2 IMPLANT
SUT VICRYL+ 3-0 144IN (SUTURE) ×4 IMPLANT
SUTURE SILK 3-0 SH-1 18XCR BRD (SUTURE) ×2 IMPLANT

## 2014-10-12 NOTE — Anesthesia Preprocedure Evaluation (Addendum)
Anesthesia Evaluation  Patient identified by MRN, date of birth, ID band Patient awake    Reviewed: Allergy & Precautions, H&P , NPO status , Patient's Chart, lab work & pertinent test results, reviewed documented beta blocker date and time   History of Anesthesia Complications Negative for: history of anesthetic complications  Airway Mallampati: III  TM Distance: >3 FB Neck ROM: full    Dental no notable dental hx. (+) Poor Dentition   Pulmonary neg pulmonary ROS,    Pulmonary exam normal breath sounds clear to auscultation       Cardiovascular Exercise Tolerance: Good (-) hypertension+CHF  (-) CAD, (-) Past MI, (-) Cardiac Stents and (-) CABG Normal cardiovascular exam+ dysrhythmias Atrial Fibrillation + pacemaker (-) Valvular Problems/Murmurs Rhythm:regular Rate:Normal     Neuro/Psych negative neurological ROS  negative psych ROS   GI/Hepatic negative GI ROS, Neg liver ROS,   Endo/Other  neg diabetesHypothyroidism   Renal/GU negative Renal ROS  negative genitourinary   Musculoskeletal   Abdominal   Peds  Hematology negative hematology ROS (+)   Anesthesia Other Findings Past Medical History:   Claudication                                                   Comment:leg   A-fib                                                        Thyroid disease                                              Hyperlipidemia                                               CHF (congestive heart failure)                               Reproductive/Obstetrics negative OB ROS                            Anesthesia Physical Anesthesia Plan  ASA: III  Anesthesia Plan: General   Post-op Pain Management:    Induction:   Airway Management Planned:   Additional Equipment:   Intra-op Plan:   Post-operative Plan:   Informed Consent: I have reviewed the patients History and Physical, chart, labs and  discussed the procedure including the risks, benefits and alternatives for the proposed anesthesia with the patient or authorized representative who has indicated his/her understanding and acceptance.   Dental Advisory Given  Plan Discussed with: Anesthesiologist, CRNA and Surgeon  Anesthesia Plan Comments:         Anesthesia Quick Evaluation

## 2014-10-12 NOTE — Op Note (Signed)
Exploratory Laparotomy with Small Bowel Resection with Anastomosis Procedure Note  Indications: The patient presents to the hospital with abdominal tenderness, leukocytosis and radiographic findings of a small bowel obstruction.  He was managed with NG tube decompression over four days but did not improve. His distension and pain only increase.  It was then decided to perform Ex-lap for SBO.  Pre-operative Diagnosis: Small bowel obstruction  Post-operative Diagnosis: Small bowel obstruction with ischemia.  Surgeon: Gladis Riffle   Assistants: Dr. Marshia Ly  Anesthesia: General endotracheal anesthesia  ASA Class: 3  Procedure Details  The risks, benefits, complications, treatment options, and expected outcomes were discussed with the patient. The possibilities of reaction to medication, pulmonary aspiration, perforation of viscus, bleeding, recurrent infection, the need for additional procedures, failure to diagnose a condition, and creating a complication requiring transfusion or operation were discussed with the patient. The patient concurred with the proposed plan, giving informed consent.  The patient was taken to the Operating Room, identified as Johnny Norman and the procedure verified as Exploratory Laparotomy with possible Small Bowel Resection. A Time Out was held and the above information confirmed.  The patient was placed supine after induction of a general anesthetic, the abdomen was prepped and draped in the standard fashion. A vertical subumbilical incision was created. The abdominal cavity was entered. The bowel was inspected and gently run. A segment of 2cm of inflamed bowel in the distal small bowel with obvious transition from dilated to flattened bowel was seen.  No adhesions or scar tissue was seen but a segment of bowel 20cm toward the ileocecal valve also had a small amount of inflammation. It appeared they may have been adhered together previously. The segment of  small bowel with the inflamed portion also had a stricture and it was felt best to resect this portion since the stricture was still present.  The small bowel was resected by making an incision in both sides of the mesentery after aligning the healthy bowel.  An incision was made in both parts of small bowel and the anastomosis was performed with a linear stapling device. The staple lines were then offset using alice clamps and linear stapler used to resect the small bowel specimen.  This was then taken to the back table and passed off the field.  A 3-0 silk suture was used to decrease tension at the distal end of the anastomosis.  Also silk was used in a Lembert suture to invert the corners of the staple line.  The abdominal cavity was irrigated. Hemostasis was confirmed. The bowel was returned to its anatomic location. The omentum was placed over the abdominal contents. The midline incision was closed with a running 1-0 Loop PDS suture. The soft tissue was irrigated. The skin incision was closed with staples.  Instrument, sponge, and needle counts were correct prior to abdominal closure and at the conclusion of the case.   Findings: Small segment of inflamed strictured bowel in the distal ileum  Estimated Blood Loss:  less than 50 mL         Drains: none         Total IV Fluids:         Specimens: Small bowel         Implants: none         Complications:  None; patient tolerated the procedure well.         Disposition: PACU - hemodynamically stable.  Condition: stable

## 2014-10-12 NOTE — Anesthesia Procedure Notes (Signed)
Procedure Name: Intubation Date/Time: 10/12/2014 9:09 AM Performed by: Charna Busman Pre-anesthesia Checklist: Patient identified, Emergency Drugs available and Patient being monitored Patient Re-evaluated:Patient Re-evaluated prior to inductionOxygen Delivery Method: Circle system utilized Preoxygenation: Pre-oxygenation with 100% oxygen Intubation Type: IV induction, Rapid sequence and Cricoid Pressure applied Laryngoscope Size: Mac and 4 Grade View: Grade II Tube type: Oral Tube size: 7.5 mm Number of attempts: 2 Airway Equipment and Method: Stylet Placement Confirmation: ETT inserted through vocal cords under direct vision,  positive ETCO2,  CO2 detector and breath sounds checked- equal and bilateral Secured at: 23 cm Tube secured with: Tape Dental Injury: Teeth and Oropharynx as per pre-operative assessment

## 2014-10-12 NOTE — Progress Notes (Signed)
Initial Nutrition Assessment   INTERVENTION:   Coordination of Care: await diet progression as medically able   NUTRITION DIAGNOSIS:   Inadequate oral intake related to inability to eat as evidenced by NPO status.  GOAL:   Patient will meet greater than or equal to 90% of their needs  MONITOR:    (Energy Intake, Anthropometrics, Digestive system, Electrolyte and Renal Profile)  REASON FOR ASSESSMENT:   NPO/Clear Liquid Diet    ASSESSMENT:   Pt admitted 9/10 with vomitting and abdominal pain secondary to SBO, pt underwent exp lap this am.   Past Medical History  Diagnosis Date  . Claudication     leg  . A-fib   . Thyroid disease   . Hyperlipidemia   . CHF (congestive heart failure)     Diet Order:  Diet NPO time specified Except for: Sips with Meds, Ice Chips    Current Nutrition: Pt remains NPO currently  Food/Nutrition-Related History: Pt family reports pt had a good appetite PTA usually eats lighter meals such as salmon with vegetables. No supplements PTA.   Medications: D5 0.45%NS with KCl at 160mL/hr, Protonix, Morphine  Electrolyte/Renal Profile and Glucose Profile:   Recent Labs Lab 10/08/14 0434 10/09/14 0451 10/11/14 0608 10/12/14 0403  NA 137 137 134* 135  K 4.0 3.9 3.9 3.6  CL 104 104 105 106  CO2 BUN CREATININE 1.19 0.95 0.93 0.97  CALCIUM 8.7* 8.7* 8.1* 7.8*  MG 1.8  --   --   --   GLUCOSE 130* 130* 128* 115*   Protein Profile:  Recent Labs Lab 10/07/14 0703 10/08/14 0434  ALBUMIN 4.3 3.5   Nutritional Anemia Profile:  CBC Latest Ref Rng 10/12/2014 10/10/2014 10/09/2014  WBC 3.8 - 10.6 K/uL 9.6 13.3(H) 14.4(H)  Hemoglobin 13.0 - 18.0 g/dL 16.1 09.6 04.5  Hematocrit 40.0 - 52.0 % 39.9(L) 39.3(L) 42.9  Platelets 150 - 440 K/uL 174 153 168    Gastrointestinal Profile: pt remains with NG tube in place, out in last 24 hours documented, out day prior. Last BM:  unknown   Nutrition-Focused  Physical Exam Findings:  Unable to complete Nutrition-Focused physical exam at this time.    Weight Change: Pt family reports pt weight has been stable   Skin:  Reviewed, no issues  Last BM:  unknown  Height:   Ht Readings from Last 1 Encounters:  10/07/14  (1.905 m)    Weight:   Wt Readings from Last 1 Encounters:  10/12/14 172 lb 9.6 oz (78.291 kg)    Wt Readings from Last 10 Encounters:  10/12/14 172 lb 9.6 oz (78.291 kg)  10/05/14 168 lb 12 oz (76.544 kg)  09/07/13 170 lb (77.111 kg)  09/07/12 168 lb 8 oz (76.431 kg)  12/02/11 168 lb 8 oz (76.431 kg)    BMI:  Body mass index is 21.57 kg/(m^2).  Estimated Nutritional Needs:   Kcal:  BEE: 1535kcals, TEE: (IF 1.1-1.3)(AF 1.2) [id number]  Protein:  85-101g protein (1.1-1.3g/kg)   Fluid:  1950-23100mL of fluid (25-67mL/kg)   EDUCATION NEEDS:   No education needs identified at this time   HIGH Care Level  Leda Quail, RD, LDN Pager [phone]

## 2014-10-12 NOTE — Transfer of Care (Signed)
Immediate Anesthesia Transfer of Care Note  Patient: Johnny Norman  Procedure(s) Performed: Procedure(s): EXPLORATORY LAPAROTOMY (N/A) SMALL BOWEL RESECTION  Patient Location: PACU  Anesthesia Type:General  Level of Consciousness: sedated and patient cooperative  Airway & Oxygen Therapy: Patient Spontanous Breathing and Patient connected to face mask oxygen  Post-op Assessment: Report given to RN and Post -op Vital signs reviewed and stable  Post vital signs: Reviewed and stable  Last Vitals:  Filed Vitals:   10/12/14 0511  BP: 164/86  Pulse: 76  Temp: 36.3 C  Resp: 16    Complications: No apparent anesthesia complications

## 2014-10-13 LAB — BASIC METABOLIC PANEL
ANION GAP: 5 (ref 5–15)
BUN: 17 mg/dL (ref 6–20)
CALCIUM: 7.5 mg/dL — AB (ref 8.9–10.3)
CHLORIDE: 106 mmol/L (ref 101–111)
CO2: 23 mmol/L (ref 22–32)
Creatinine, Ser: 1.51 mg/dL — ABNORMAL HIGH (ref 0.61–1.24)
GFR calc non Af Amer: 40 mL/min — ABNORMAL LOW (ref >60)
GFR, EST AFRICAN AMERICAN: 46 mL/min — AB (ref >60)
GLUCOSE: 94 mg/dL (ref 65–99)
POTASSIUM: 4.7 mmol/L (ref 3.5–5.1)
Sodium: 134 mmol/L — ABNORMAL LOW (ref 135–145)

## 2014-10-13 LAB — CBC
HEMATOCRIT: 40.6 % (ref 40.0–52.0)
HEMOGLOBIN: 13.9 g/dL (ref 13.0–18.0)
MCH: 30.5 pg (ref 26.0–34.0)
MCHC: 34.3 g/dL (ref 32.0–36.0)
MCV: 89 fL (ref 80.0–100.0)
Platelets: 185 10*3/uL (ref 150–440)
RBC: 4.56 MIL/uL (ref 4.40–5.90)
RDW: 13.2 % (ref 11.5–14.5)
WBC: 13.9 10*3/uL — ABNORMAL HIGH (ref 3.8–10.6)

## 2014-10-13 LAB — SURGICAL PATHOLOGY

## 2014-10-13 MED ORDER — SODIUM CHLORIDE 0.9 % IV BOLUS (SEPSIS)
1000.0000 mL | Freq: Once | INTRAVENOUS | Status: AC
  Administered 2014-10-13: 1000 mL via INTRAVENOUS

## 2014-10-13 MED ORDER — DEXTROSE-NACL 5-0.45 % IV SOLN
INTRAVENOUS | Status: DC
  Administered 2014-10-13 – 2014-10-16 (×5): via INTRAVENOUS

## 2014-10-13 MED ORDER — MORPHINE SULFATE (PF) 2 MG/ML IV SOLN
1.0000 mg | INTRAVENOUS | Status: DC | PRN
  Administered 2014-10-13 – 2014-10-16 (×8): 1 mg via INTRAVENOUS
  Filled 2014-10-13 (×8): qty 1

## 2014-10-13 NOTE — Progress Notes (Signed)
Will sign off. Please resume home meds able to take PO. Call pager if questions over weekend  [phone]

## 2014-10-13 NOTE — Progress Notes (Signed)
Dr. Orvis Brill made aware of patient's inadequate urine output.  We will continue to monitor.

## 2014-10-13 NOTE — Progress Notes (Signed)
Dr. Orvis Brill made aware of EKG strips this am. Intermittent conduction changes.We will continue to monitor.

## 2014-10-13 NOTE — Progress Notes (Signed)
PCA morphine dc'd per MD order. Wasted 15 mL with Derry Lory

## 2014-10-13 NOTE — Care Management Important Message (Signed)
Important Message  Patient Details  Name: Johnny Norman MRN: [id number] Date of Birth: 1925/04/22   Medicare Important Message Given:  Yes-fourth notification given    Olegario Messier A Allmond 10/13/2014, 11:30 AM

## 2014-10-13 NOTE — Clinical Social Work Note (Signed)
Clinical Social Work Assessment  Patient Details  Name: Johnny Norman MRN: [id number] Date of Birth: 05/10/1925  Date of referral:  10/13/14               Reason for consult:  Facility Placement                Permission sought to share information with:    Permission granted to share information::     Name::        Agency::     Relationship::     Contact Information:     Housing/Transportation Living arrangements for the past 2 months:  Single Family Home Source of Information:  Adult Children Patient Interpreter Needed:  None Criminal Activity/Legal Involvement Pertinent to Current Situation/Hospitalization:  No - Comment as needed Significant Relationships:  Adult Children Lives with:  Self Do you feel safe going back to the place where you live?    Need for family participation in patient care:     Care giving concerns:  Patient lives alone   Social Worker assessment / plan:  PT has recommended rehab for patient. Patient currently sleeping and resting and patient's son, Johnny Norman, approached CSW to discuss options surrounding patient and his discharge. CSW at the time was not aware of PT's recommendation as it was not in documented at that time. CSW explained rehab and the rehab process in regards to insurance, etc. CSW also explained home health and options surrounding that as well.   Once it was known that PT did recommend rehab, CSW went back to speak to patient's son and he gave permission for a bedsearch to be initiated and stated that he would speak with his father to see what he felt was best and then go from there. Bedsearch initiated.   Employment status:  Retired Database administrator PT Recommendations:  Skilled Nursing Facility Information / Referral to community resources:  Skilled Nursing Facility  Patient/Family's Response to care:  Patient's son very Adult nurse of CSW assistance.  Patient/Family's Understanding of and Emotional  Response to Diagnosis, Current Treatment, and Prognosis:  Patient's son stated that his father may wish to return home but that if PT is recommending rehab he is going to encourage his father to consider that as an option. CSW will follow.  Emotional Assessment Appearance:    Attitude/Demeanor/Rapport:  Unable to Assess Affect (typically observed):  Unable to Assess Orientation:    Alcohol / Substance use:  Not Applicable Psych involvement (Current and /or in the community):  No (Comment)  Discharge Needs  Concerns to be addressed:  Care Coordination Readmission within the last 30 days:  No Current discharge risk:  None Barriers to Discharge:  No Barriers Identified   York Spaniel, LCSW 10/13/2014, 4:06 PM

## 2014-10-13 NOTE — Progress Notes (Signed)
Johnny Norman is a 81yr old Male POD#1 from Ex-Lap, small bowel resection for SBO.  Patient did not sleep well overnight, due to PCA breathing monitor going off, it was not picking up his breathing well.  He only used the PCA 3 times overnight and states pain well controled this AM.  He only had 175 uop overnight as well.   Filed Vitals:   10/13/14 0838  BP: 97/68  Pulse: 77  Temp: 98.6 F (37 C)  Resp: 18   I/O last 3 completed shifts: In: 5071.3 [I.V.:4691.3; NG/GT:330; IV Piggyback:50] Out: 2500 [Urine:2000; Emesis/NG output:500] Total I/O In: 2155.3 [I.V.:2125.3; NG/GT:30] Out: -    General appearance: alert, cooperative and no distress Lungs: clear to auscultation bilaterally Heart: regularly irregular rhythm and no murmur Abdomen: soft, appropriately tender, mildly distended Extremities: 2+ pulses, no edema   A/P: 81  Yr old  POD#1 from Ex lap, SBO Continue NG tube suction and NPo except ice chips, gum, hard candy Pain: will d/c PCA, morphine prns Decreased UOP with bump in Cr, will continue foley to adequately monitor UOP, giving fluid bolus, recheck BMP in AM PT to evaluate for deconditioning, may need acute rehab Afib: dual pacer, rate controlled at this time, will continue to monitor, will restart coumadin when taking po HTN: hydralazine prns, will resume home medications when tolerating po

## 2014-10-13 NOTE — Evaluation (Signed)
Physical Therapy Evaluation Patient Details Name: YOLANDA DOCKENDORF MRN: [id number] DOB: [birth year] Today's Date: 10/13/2014   History of Present Illness  81 yo male with onset of SBO, now with release of adhesions with laparoscopic surgery.  PMHx: SSS, a-fib, CHF, LE  claudication, pacemaker.    Clinical Impression  Pt is demonstrating a better control of standing and gait with practice, but is hoping for going home as soon as possible.  His plan is SNF for the recovery of balance, and should be able to stay very short time to restor her balance and strength.      Follow Up Recommendations SNF    Equipment Recommendations  Rolling walker with 5" wheels    Recommendations for Other Services       Precautions / Restrictions Precautions Precautions: Fall (telemetry) Restrictions Weight Bearing Restrictions: No      Mobility  Bed Mobility Overal bed mobility: Needs Assistance Bed Mobility: Supine to Sit     Supine to sit: Min assist     General bed mobility comments: assistance to finish scooting out  Transfers Overall transfer level: Needs assistance Equipment used: Rolling walker (2 wheeled);1 person hand held assist Transfers: Sit to/from [employer] Sit to Stand: Min assist Stand pivot transfers: Min assist       General transfer comment: assist for equipment  Ambulation/Gait Ambulation/Gait assistance: Min assist;Min guard Ambulation Distance (Feet): 80 Feet Assistive device: Rolling walker (2 wheeled);1 person hand held assist Gait Pattern/deviations: Step-through pattern;Shuffle;Wide base of support;Trunk flexed Gait velocity: reduced Gait velocity interpretation: Below normal speed for age/gender General Gait Details: reminders given to stand in more upright posture  Stairs            Wheelchair Mobility    Modified Rankin (Stroke Patients Only)       Balance Overall balance assessment: Needs assistance Sitting-balance  support: Feet supported;Single extremity supported Sitting balance-Leahy Scale: Fair   Postural control: Posterior lean Standing balance support: Bilateral upper extremity supported Standing balance-Leahy Scale: Poor Standing balance comment: weak and knees appear buckling at times                             Pertinent Vitals/Pain Pain Assessment: Faces Pain Score: 2  Faces Pain Scale: Hurts a little bit Pain Location: lap site Pain Intervention(s): Limited activity within patient's tolerance;Premedicated before session    Home Living Family/patient expects to be discharged to:: Private residence Living Arrangements: Alone Available Help at Discharge: Family;Friend(s);Available PRN/intermittently Type of Home: House Home Access: Stairs to enter     Home Layout: One level        Prior Function Level of Independence: Independent               Hand Dominance        Extremity/Trunk Assessment   Upper Extremity Assessment: Overall WFL for tasks assessed           Lower Extremity Assessment: Generalized weakness      Cervical / Trunk Assessment: Normal  Communication   Communication: No difficulties  Cognition Arousal/Alertness: Awake/alert Behavior During Therapy: WFL for tasks assessed/performed Overall Cognitive Status: Within Functional Limits for tasks assessed                      General Comments General comments (skin integrity, edema, etc.): Pt needs to be assisted due to fall risk and inability to control walker safely alone  Exercises        Assessment/Plan    PT Assessment Patient needs continued PT services  PT Diagnosis Difficulty walking;Generalized weakness   PT Problem List Decreased strength;Decreased range of motion;Decreased activity tolerance;Decreased balance;Decreased mobility;Decreased coordination;Decreased knowledge of use of DME;Decreased safety awareness;Cardiopulmonary status limiting  activity;Decreased skin integrity;Pain  PT Treatment Interventions DME instruction;Gait training;Stair training;Functional mobility training;Therapeutic activities;Therapeutic exercise;Balance training;Neuromuscular re-education;Patient/family education   PT Goals (Current goals can be found in the Care Plan section) Acute Rehab PT Goals Patient Stated Goal: to walk alone and feel better PT Goal Formulation: With patient Time For Goal Achievement: 10/27/14 Potential to Achieve Goals: Good    Frequency Min 2X/week   Barriers to discharge Inaccessible home environment;Decreased caregiver support has stairs to enter house and no family who can definitely stay with him    Co-evaluation               End of Session Equipment Utilized During Treatment: Gait belt (under armpits) Activity Tolerance: Patient limited by fatigue Patient left: in chair;with call bell/phone within reach;with nursing/sitter in room;with chair alarm set Nurse Communication: Mobility status         Time: 1205-1238 PT Time Calculation (min) (ACUTE ONLY): 33 min   Charges:   PT Evaluation $Initial PT Evaluation Tier I: 1 Procedure PT Treatments $Gait Training: 8-22 mins   PT G CodesIvar Drape 11-01-14, 2:37 PM   Samul Dada, PT MS Acute Rehab Dept. Number: [id number] and [facility] [phone]

## 2014-10-13 NOTE — Anesthesia Postprocedure Evaluation (Signed)
  Anesthesia Post-op Note  Patient: Johnny Norman  Procedure(s) Performed: Procedure(s): EXPLORATORY LAPAROTOMY (N/A) SMALL BOWEL RESECTION  Anesthesia type:General  Patient location: PACU  Post pain: Pain level controlled  Post assessment: Post-op Vital signs reviewed, Patient's Cardiovascular Status Stable, Respiratory Function Stable, Patent Airway and No signs of Nausea or vomiting  Post vital signs: Reviewed and stable  Last Vitals:  Filed Vitals:   10/13/14 1237  BP: 108/64  Pulse: 84  Temp: 36.8 C  Resp: 17    Level of consciousness: awake, alert  and patient cooperative  Complications: No apparent anesthesia complications

## 2014-10-14 LAB — BASIC METABOLIC PANEL
Anion gap: 4 — ABNORMAL LOW (ref 5–15)
BUN: 24 mg/dL — AB (ref 6–20)
CO2: 24 mmol/L (ref 22–32)
CREATININE: 1.38 mg/dL — AB (ref 0.61–1.24)
Calcium: 7.7 mg/dL — ABNORMAL LOW (ref 8.9–10.3)
Chloride: 109 mmol/L (ref 101–111)
GFR calc Af Amer: 51 mL/min — ABNORMAL LOW (ref >60)
GFR, EST NON AFRICAN AMERICAN: 44 mL/min — AB (ref >60)
GLUCOSE: 85 mg/dL (ref 65–99)
POTASSIUM: 3.8 mmol/L (ref 3.5–5.1)
Sodium: 137 mmol/L (ref 135–145)

## 2014-10-14 NOTE — Progress Notes (Signed)
2 Days Post-Op   Subjective:  He is feeling better today. He has ambulated and is having less abdominal pain. He does not have any flatus or bowel function as yet. He is not febrile over the course of the evening. He is not nauseated. His potassium is a bit low this morning and we will replete that. Otherwise, he has no significant complaints.  Vital signs in last 24 hours: Temp:  [98.2 F (36.8 C)-99.1 F (37.3 C)] 99.1 F (37.3 C) (09/17 0550) Pulse Rate:  [70-84] 70 (09/17 0622) Resp:  [17-20] 20 (09/17 0550) BP: (108-122)/(46-67) 118/67 mmHg (09/17 0622) SpO2:  [95 %-97 %] 95 % (09/17 0550) Weight:  [83.462 kg (184 lb)] 83.462 kg (184 lb) (09/17 0550) Last BM Date: 10/05/14  Intake/Output from previous day: 09/16 0701 - 09/17 0700 In: 4926.6 [I.V.:4776.6; NG/GT:150] Out: 1130 [Urine:380; Emesis/NG output:750]  GI: Abdomen soft with minimal wound drainage and no evidence of any significant infection.  Lab Results:  CBC  Recent Labs  10/12/14 0403 10/13/14 0418  WBC 9.6 13.9*  HGB 13.4 13.9  HCT 39.9* 40.6  PLT 174 185   CMP     Component Value Date/Time   NA 137 10/14/2014 0637   K 3.8 10/14/2014 0637   CL 109 10/14/2014 0637   CO2 24 10/14/2014 0637   GLUCOSE 85 10/14/2014 0637   BUN 24* 10/14/2014 0637   CREATININE 1.38* 10/14/2014 0637   CALCIUM 7.7* 10/14/2014 0637   PROT 5.9* 10/08/2014 0434   PROT 6.2 10/12/2012 0815   ALBUMIN 3.5 10/08/2014 0434   AST 30 10/08/2014 0434   ALT 17 10/08/2014 0434   ALKPHOS 52 10/08/2014 0434   BILITOT 1.4* 10/08/2014 0434   GFRNONAA 44* 10/14/2014 0637   GFRAA 51* 10/14/2014 0637   PT/INR  Recent Labs  10/12/14 0403  LABPROT 15.9*  INR 1.25    Studies/Results: No results found.  Assessment/Plan: Overall he is improving. We'll discontinue his nasogastric tube as soon as were able to get evidence of some bowel function. I discussed the plan with him in detail and he is in agreement.

## 2014-10-15 MED ORDER — ASPIRIN EC 81 MG PO TBEC
81.0000 mg | DELAYED_RELEASE_TABLET | Freq: Every day | ORAL | Status: DC
  Administered 2014-10-15 – 2014-10-18 (×4): 81 mg via ORAL
  Filled 2014-10-15 (×4): qty 1

## 2014-10-15 MED ORDER — ATORVASTATIN CALCIUM 20 MG PO TABS
10.0000 mg | ORAL_TABLET | Freq: Every day | ORAL | Status: DC
  Administered 2014-10-15 – 2014-10-17 (×3): 10 mg via ORAL
  Filled 2014-10-15 (×3): qty 1

## 2014-10-15 MED ORDER — MIRABEGRON ER 25 MG PO TB24
25.0000 mg | ORAL_TABLET | Freq: Every day | ORAL | Status: DC
  Administered 2014-10-15 – 2014-10-18 (×4): 25 mg via ORAL
  Filled 2014-10-15 (×4): qty 1

## 2014-10-15 MED ORDER — NADOLOL 20 MG PO TABS
20.0000 mg | ORAL_TABLET | Freq: Every day | ORAL | Status: DC
  Administered 2014-10-15 – 2014-10-18 (×4): 20 mg via ORAL
  Filled 2014-10-15 (×4): qty 1

## 2014-10-15 MED ORDER — LEVOTHYROXINE SODIUM 88 MCG PO TABS
88.0000 ug | ORAL_TABLET | Freq: Every day | ORAL | Status: DC
  Administered 2014-10-16 – 2014-10-18 (×3): 88 ug via ORAL
  Filled 2014-10-15 (×3): qty 1

## 2014-10-15 NOTE — Progress Notes (Signed)
Johnny Norman is a 81yr old Male POD#3 from Ex-Lap, small bowel resection for SBO.  Patient passing flatus last night and ng tube removed.  Patient states some intermittent abdominal pain since last night.  He has been up and sitting in chair, he is burping but denies any nausea.   Filed Vitals:   10/15/14 0559  BP: 109/54  Pulse: 73  Temp: 98.9 F (37.2 C)  Resp: 16   I/O last 3 completed shifts: In: 1932 [I.V.:1782; NG/GT:150] Out: 1900 [Urine:1130; Emesis/NG output:770] Total I/O In: 103 [I.V.:103] Out: -   General appearance: alert, cooperative and no distress Abdomen: soft, moderately distended, incision c/d/i, approrpiately tender Extremities: 2+ pulses, no edema   Labs;  CBC Latest Ref Rng 10/13/2014 10/12/2014 10/10/2014  WBC 3.8 - 10.6 K/uL 13.9(H) 9.6 13.3(H)  Hemoglobin 13.0 - 18.0 g/dL 16.1 09.6 04.5  Hematocrit 40.0 - 52.0 % 40.6 39.9(L) 39.3(L)  Platelets 150 - 440 K/uL 185 174 153   BMP Latest Ref Rng 10/14/2014 10/13/2014 10/12/2014  Glucose 65 - 99 mg/dL 85 94 409(W)  BUN 6 - 20 mg/dL 11(B) 17 10  Creatinine 0.61 - 1.24 mg/dL 1.47(W) 2.95(A) 2.13  Sodium 135 - 145 mmol/L 137 134(L) 135  Potassium 3.5 - 5.1 mmol/L 3.8 4.7 3.6  Chloride 101 - 111 mmol/L 109 106 106  CO2 22 - 32 mmol/L Calcium 8.9 - 10.3 mg/dL 7.7(L) 7.5(L) 7.8(L)    Assessment/Plan:81yr old Male POD#3 from Ex-Lap, small bowel resection for SBO. Advance to clear liquids but instructed to take slowly Continue ambulation--PT to continue to evaluate Continue Foley for urinary retention will restart home prostate meds today Afib--will restart home po meds

## 2014-10-16 LAB — BASIC METABOLIC PANEL
Anion gap: 5 (ref 5–15)
BUN: 17 mg/dL (ref 6–20)
CHLORIDE: 106 mmol/L (ref 101–111)
CO2: 24 mmol/L (ref 22–32)
Calcium: 7.9 mg/dL — ABNORMAL LOW (ref 8.9–10.3)
Creatinine, Ser: 1.08 mg/dL (ref 0.61–1.24)
GFR calc non Af Amer: 60 mL/min — ABNORMAL LOW (ref >60)
Glucose, Bld: 122 mg/dL — ABNORMAL HIGH (ref 65–99)
POTASSIUM: 3.3 mmol/L — AB (ref 3.5–5.1)
SODIUM: 135 mmol/L (ref 135–145)

## 2014-10-16 LAB — CBC
HEMATOCRIT: 34 % — AB (ref 40.0–52.0)
HEMOGLOBIN: 11.5 g/dL — AB (ref 13.0–18.0)
MCH: 29.8 pg (ref 26.0–34.0)
MCHC: 33.8 g/dL (ref 32.0–36.0)
MCV: 88.1 fL (ref 80.0–100.0)
Platelets: 200 10*3/uL (ref 150–440)
RBC: 3.87 MIL/uL — AB (ref 4.40–5.90)
RDW: 13.5 % (ref 11.5–14.5)
WBC: 7.7 10*3/uL (ref 3.8–10.6)

## 2014-10-16 MED ORDER — POTASSIUM CHLORIDE 10 MEQ/100ML IV SOLN
10.0000 meq | INTRAVENOUS | Status: AC
  Administered 2014-10-16 (×2): 10 meq via INTRAVENOUS
  Filled 2014-10-16 (×2): qty 100

## 2014-10-16 MED ORDER — ACETAMINOPHEN 500 MG PO TABS
500.0000 mg | ORAL_TABLET | Freq: Four times a day (QID) | ORAL | Status: DC | PRN
  Administered 2014-10-17 – 2014-10-18 (×2): 500 mg via ORAL
  Filled 2014-10-16 (×2): qty 1

## 2014-10-16 NOTE — Clinical Social Work Note (Signed)
Patient has had bed offers and has had preference of [facility] offer. CSW spoke with patient who was ambulating around the nurse's station this afternoon with his IV pole. Patient is in agreement to going to [facility] for rehab if this is still the recommendation from PT.  York Spaniel MSW,LCSW [phone]

## 2014-10-16 NOTE — Progress Notes (Signed)
4 Days Post-Op   Subjective: 81 year old male postop day #4 status post export her laparotomy for small bowel obstruction. Patient reports feeling much better today. He has been tolerating clear liquids. He has been undergoing slow in an effort to decrease a full discomfort feeling. He denies any nausea, vomiting. He states he has passed flatus but denies any bowel movement. He continues to have a Foley in place which she voices interest in having removed. He continues to require assistance for ambulation and questions how much ambulation is required of him. He remains in good spirits  Vital signs in last 24 hours: Temp:  [98 F (36.7 C)-99.2 F (37.3 C)] 98 F (36.7 C) (09/19 0545) Pulse Rate:  [67-78] 70 (09/19 0545) Resp:  [16] 16 (09/19 0545) BP: (108-137)/(61-69) 133/69 mmHg (09/19 0545) SpO2:  [93 %-98 %] 95 % (09/19 0545) Last BM Date: 10/05/14  Intake/Output from previous day: 09/18 0701 - 09/19 0700 In: 1999.5 [P.O.:990; I.V.:1009.5] Out: 1250 [Urine:1250]  GI: Abdomen is soft, nondistended. Appropriately tender to palpation around the incision. Midline well approximated without evidence of infection or peritonitis.  Lab Results:  CBC  Recent Labs  10/16/14 0623  WBC 7.7  HGB 11.5*  HCT 34.0*  PLT 200   CMP     Component Value Date/Time   NA 135 10/16/2014 0623   K 3.3* 10/16/2014 0623   CL 106 10/16/2014 0623   CO2 24 10/16/2014 0623   GLUCOSE 122* 10/16/2014 0623   BUN 17 10/16/2014 0623   CREATININE 1.08 10/16/2014 0623   CALCIUM 7.9* 10/16/2014 0623   PROT 5.9* 10/08/2014 0434   PROT 6.2 10/12/2012 0815   ALBUMIN 3.5 10/08/2014 0434   AST 30 10/08/2014 0434   ALT 17 10/08/2014 0434   ALKPHOS 52 10/08/2014 0434   BILITOT 1.4* 10/08/2014 0434   GFRNONAA 60* 10/16/2014 0623   GFRAA >60 10/16/2014 0623   PT/INR No results for input(s): LABPROT, INR in the last 72 hours.  Studies/Results: No results found.  Assessment/Plan: 81 year old male status  post export her laparotomy for small bowel obstruction. Continues to improve, encourage ambulation and IS usage. Will plan to remove Foley once mobility has improved likely later today or tomorrow morning. Will transition to all oral medications once bowel function has improved.  Ricarda Frame, MD FACS General Surgeon [facility] Surgical

## 2014-10-16 NOTE — Progress Notes (Signed)
Physical Therapy Treatment Patient Details Name: Johnny Norman MRN: [id number] DOB: [birth year] Today's Date: 10/16/2014    History of Present Illness 81 yo male with onset of SBO, now with release of adhesions with laparoscopic surgery.  PMHx: SSS, a-fib, CHF, LE  claudication, pacemaker.      PT Comments    Pt agreeable to PT; notes fatigue and overall weakness especially by afternoon. Pt reports mild pain in L abdomen/flank that is noted to increase with stand and ambulation. Pt performs all bed mobility, transfers and ambulation slowly with increased time/effort to perform and requires safety cues for sit to/from stand transfers. Ambulation speed reduced as well as distance. Pt notes some lightheadedness with ambulation. Pt does not feel strong enough to negotiate steps at this time. Continue PT for progression of strength and endurance to improve overall functional mobility; pt would benefit from rehab stay to progress strength, transfers, ambulation ans stairs before safely being able to return home.   Follow Up Recommendations  SNF     Equipment Recommendations  Rolling walker with 5" wheels    Recommendations for Other Services       Precautions / Restrictions Precautions Precautions: Fall Restrictions Weight Bearing Restrictions: No    Mobility  Bed Mobility Overal bed mobility: Needs Assistance Bed Mobility: Supine to Sit;Sit to Supine     Supine to sit: Min assist (primarily for LEs; increased time/effort) Sit to supine: Min assist (BLEs)   General bed mobility comments:  (cues, use of trapeze and Min a to reposition upward in bed)  Transfers Overall transfer level: Needs assistance Equipment used: Rolling walker (2 wheeled);1 person hand held assist Transfers: Sit to/from Stand Sit to Stand: Min assist (cues for hand placement )            Ambulation/Gait Ambulation/Gait assistance: Min guard Ambulation Distance (Feet): 60 Feet Assistive device:  Rolling walker (2 wheeled);1 person hand held assist Gait Pattern/deviations: Step-through pattern;Trunk flexed Gait velocity: reduced Gait velocity interpretation: Below normal speed for age/gender General Gait Details: Decreased speed and endurance. Increased pain in L abdomen with ambulation. Cues for upright posture. Assist for IV pole   Stairs Stairs:  (does not have strength to perform)          Wheelchair Mobility    Modified Rankin (Stroke Patients Only)       Balance                                    Cognition Arousal/Alertness: Awake/alert (Fatigued) Behavior During Therapy: WFL for tasks assessed/performed Overall Cognitive Status: Within Functional Limits for tasks assessed                      Exercises      General Comments        Pertinent Vitals/Pain Pain Assessment:  (Moderate pain in L abdomen/flank with stand/ambulation) Pain Intervention(s): Limited activity within patient's tolerance;Monitored during session    Home Living                      Prior Function            PT Goals (current goals can now be found in the care plan section) Progress towards PT goals: Progressing toward goals    Frequency  Min 2X/week    PT Plan Current plan remains appropriate    Co-evaluation  End of Session Equipment Utilized During Treatment: Gait belt Activity Tolerance: Patient limited by fatigue (poor endurance due to generalized weakness/mild lightheaded) Patient left: in bed;with call bell/phone within reach;with bed alarm set;with family/visitor present;with SCD's reapplied     Time: 4098-1191 PT Time Calculation (min) (ACUTE ONLY): 20 min  Charges:  $Gait Training: 8-22 mins                    G Codes:      Kristeen Miss 10/16/2014, 4:11 PM

## 2014-10-16 NOTE — Care Management Important Message (Signed)
Important Message  Patient Details  Name: Johnny Norman MRN: [id number] Date of Birth: 10/28/1925   Medicare Important Message Given:  Yes-second notification given    Olegario Messier A Allmond 10/16/2014, 2:09 PM

## 2014-10-16 NOTE — Progress Notes (Signed)
SNF and Non-Emergent EMS Transport Benefits:  Number called: [phone] Rep: Venora Maples Reference Number: [id number]  [facility] Medicare Advantage PPO policy active as of 04/28/30 with no deductible.  Out of pocket max is $4000, of which $660 met so far.  In-network SNF: $0 copay for days 1-20 and a $50 daily copay for days 21-100. Once out of pocket is reached, patient covered at 100% for remainder of 100 day benefit period.  $0 copay for professional fees and 3 day hospital stay is not required.  Josem Kaufmann is required: [phone].    Non-emergent EMS transport: $75 copay for each one way medically necessary, Medicare covered trip.  Josem Kaufmann is not required for services codes [phone] or [phone] for PPO plans.

## 2014-10-17 MED ORDER — PANTOPRAZOLE SODIUM 40 MG PO TBEC
40.0000 mg | DELAYED_RELEASE_TABLET | Freq: Every day | ORAL | Status: DC
  Administered 2014-10-17 – 2014-10-18 (×2): 40 mg via ORAL
  Filled 2014-10-17 (×2): qty 1

## 2014-10-17 NOTE — Progress Notes (Signed)
Nutrition Follow-up       INTERVENTION:  Meals and snacks: Cater to pt preferences Medical Nutrition Supplement Therapy: Will add mightyshake TID for added nutrition   NUTRITION DIAGNOSIS:   Inadequate oral intake related to inability to eat as evidenced by NPO status, being addressed as on solid foods and adding supplement    GOAL:   Patient will meet greater than or equal to 90% of their needs    MONITOR:    (Energy Intake, Anthropometrics, Digestive system, Electrolyte and Renal Profile)  REASON FOR ASSESSMENT:   NPO/Clear Liquid Diet    ASSESSMENT:   Pt admitted 9/10 with vomitting and abdominal pain secondary to SBO, pt underwent exp lap this am.    Current Nutrition: Pt sleeping, dtr reports ate few bites of eggs and toast this am, ate some of [place], mashed potatoes for lunch today.     Gastrointestinal Profile: no flatus or BM Last BM: 9/9   Medications: KCL  Electrolyte/Renal Profile and Glucose Profile:   Recent Labs Lab 10/13/14 0418 10/14/14 0637 10/16/14 0623  NA 134* 137 135  K 4.7 3.8 3.3*  CL 106 109 106  CO2 BUN 17 24* 17  CREATININE 1.51* 1.38* 1.08  CALCIUM 7.5* 7.7* 7.9*  GLUCOSE 94 85 122*     Nutrition-Focused Physical Exam Findings:  Unable to complete Nutrition-Focused physical exam at this time.  Dtr requesting pt sleep as he does not like to be interrupted.     Weight Trend since Admission: Filed Weights   10/14/14 0550 10/15/14 0700 10/17/14 0500  Weight: 184 lb (83.462 kg) 178 lb (80.74 kg) 189 lb 6.4 oz (85.911 kg)      Diet Order:  Diet regular Room service appropriate?: Yes; Fluid consistency:: Thin  Skin:  Reviewed, no issues   Height:   Ht Readings from Last 1 Encounters:  10/07/14  (1.905 m)    Weight:   Wt Readings from Last 1 Encounters:  10/17/14 189 lb 6.4 oz (85.911 kg)     BMI:  Body mass index is 23.67 kg/(m^2).  Estimated Nutritional Needs:   Kcal:  BEE:  1535kcals, TEE: (IF 1.1-1.3)(AF 1.2) [id number]  Protein:  85-101g protein (1.1-1.3g/kg)   Fluid:  1950-2345mL of fluid (25-57mL/kg)   EDUCATION NEEDS:   No education needs identified at this time  MODERATE Care Level  Megon Kalina B. Freida Busman, RD, LDN [phone] (pager)

## 2014-10-17 NOTE — Progress Notes (Signed)
5 Days Post-Op   Subjective:  81 year old male postop day #5 status post exploratory laparotomy for bowel obstruction. Patient reports having malaise today and just not feeling as generally well as they before. However he denies any nausea, vomiting, pain. He states he's tolerating his clear liquid diet and was passing flatus yesterday afternoon. He also states that if he was presented with regular food that he would likely need it. He ambulated the halls with physical therapy and the nursing staff yesterday.  Vital signs in last 24 hours: Temp:  [97.9 F (36.6 C)-98.2 F (36.8 C)] 98.2 F (36.8 C) (09/20 0533) Pulse Rate:  [68-71] 70 (09/20 0533) Resp:  [18] 18 (09/19 1205) BP: (121-135)/(62-64) 132/64 mmHg (09/20 0533) SpO2:  [96 %-100 %] 96 % (09/20 0533) Weight:  [85.911 kg (189 lb 6.4 oz)] 85.911 kg (189 lb 6.4 oz) (09/20 0500) Last BM Date: 10/06/14  Intake/Output from previous day: 09/19 0701 - 09/20 0700 In: 1575.3 [P.O.:240; I.V.:1335.3] Out: 1625 [Urine:1625]  GI: Abdomen soft, appropriately tender to palpation along the incision site. No evidence of infection or peritonitis.  Lab Results:  CBC  Recent Labs  10/16/14 0623  WBC 7.7  HGB 11.5*  HCT 34.0*  PLT 200   CMP     Component Value Date/Time   NA 135 10/16/2014 0623   K 3.3* 10/16/2014 0623   CL 106 10/16/2014 0623   CO2 24 10/16/2014 0623   GLUCOSE 122* 10/16/2014 0623   BUN 17 10/16/2014 0623   CREATININE 1.08 10/16/2014 0623   CALCIUM 7.9* 10/16/2014 0623   PROT 5.9* 10/08/2014 0434   PROT 6.2 10/12/2012 0815   ALBUMIN 3.5 10/08/2014 0434   AST 30 10/08/2014 0434   ALT 17 10/08/2014 0434   ALKPHOS 52 10/08/2014 0434   BILITOT 1.4* 10/08/2014 0434   GFRNONAA 60* 10/16/2014 0623   GFRAA >60 10/16/2014 0623   PT/INR No results for input(s): LABPROT, INR in the last 72 hours.  Studies/Results: No results found.  Assessment/Plan: 81 year old male status post motor laparotomy. Clinically  improving. Will remove Foley today. We will change IV medications to oral medications Saline lock IV Continue Lovenox for DVT prophylaxis Possible discharge to inpatient rehabilitation in the next 1-2 days.  Ricarda Frame, MD FACS General Surgeon [facility] Surgical

## 2014-10-17 NOTE — Progress Notes (Signed)
Physical Therapy Treatment Patient Details Name: Johnny Norman MRN: [id number] DOB: [birth year] Today's Date: 10/17/2014    History of Present Illness 81 yo male with onset of SBO, now with release of adhesions with laparoscopic surgery.  PMHx: SSS, a-fib, CHF, LE  claudication, pacemaker.      PT Comments    Pt reports general malaise and continued weakness today. Family member also notes some mild swelling in BLEs about the ankles; it is noted by therapist to be mild pitting edema. Discussed with pt and family use of BLE pumps and elevation. Also advised pt/family to notify nursing/MD of concerns; they are agreeable. Pt is currently ready to eat lunch in a feet dependent position. Advised pt to have nursing don leg pumps and either prop feet in recliner or return to bed to elevate. Pt was able to ambulate a little further this a.m versus yesterday p.m when patient completely fatigued from the day. Encouraged LE exercises several times a day at least. Continue PT for progression of strengthening, safety with transfers and ambulation to improve functional mobility.   Follow Up Recommendations  SNF     Equipment Recommendations  Rolling walker with 5" wheels    Recommendations for Other Services       Precautions / Restrictions Precautions Precautions: Fall Restrictions Weight Bearing Restrictions: No    Mobility  Bed Mobility Overal bed mobility: Needs Assistance             General bed mobility comments: Not tested today, as pt up in chair  Transfers Overall transfer level: Needs assistance Equipment used: Rolling walker (2 wheeled);1 person hand held assist Transfers: Sit to/from Stand Sit to Stand: Min assist (cues for proper hand placement)         General transfer comment: slow, cautious, requires VCs for proper hand placement  Ambulation/Gait Ambulation/Gait assistance: Min guard Ambulation Distance (Feet): 80 Feet Assistive device: Rolling walker (2  wheeled);1 person hand held assist Gait Pattern/deviations: Step-through pattern Gait velocity: reduced Gait velocity interpretation: Below normal speed for age/gender General Gait Details: slow, cautious    Stairs            Wheelchair Mobility    Modified Rankin (Stroke Patients Only)       Balance   Sitting-balance support: Feet supported Sitting balance-Leahy Scale: Good     Standing balance support: Bilateral upper extremity supported Standing balance-Leahy Scale: Fair                      Cognition Arousal/Alertness: Awake/alert Behavior During Therapy: WFL for tasks assessed/performed Overall Cognitive Status: Within Functional Limits for tasks assessed                      Exercises General Exercises - Lower Extremity Long Arc Quad: AROM;Both;Seated;15 reps Hip ABduction/ADduction: AROM;Both;15 reps;Seated Hip Flexion/Marching: AROM;Both;Seated;15 reps (performed in sets of 10) Toe Raises: AROM;Both;20 reps;Seated Heel Raises: AROM;Both;20 reps;Seated    General Comments        Pertinent Vitals/Pain Pain Assessment: Faces Faces Pain Scale: Hurts a little bit Pain Location: Abdomen generalized Pain Descriptors / Indicators: Discomfort Pain Intervention(s): Limited activity within patient's tolerance    Home Living                      Prior Function            PT Goals (current goals can now be found in the care plan section) Progress towards  PT goals: Progressing toward goals    Frequency  Min 2X/week    PT Plan Current plan remains appropriate    Co-evaluation             End of Session Equipment Utilized During Treatment: Gait belt Activity Tolerance: Patient limited by fatigue (limited by weakness) Patient left: in chair;with call bell/phone within reach;with chair alarm set;with family/visitor present     Time: 1610-9604 PT Time Calculation (min) (ACUTE ONLY): 26 min  Charges:  $Gait Training:  8-22 mins $Therapeutic Exercise: 8-22 mins                    G Codes:      Kristeen Miss 10/17/2014, 1:08 PM

## 2014-10-18 ENCOUNTER — Encounter
Admission: RE | Admit: 2014-10-18 | Discharge: 2014-10-18 | Disposition: A | Discharge status: Home / Self Care | Payer: Medicare Other | Source: Ambulatory Visit | Attending: Internal Medicine | Admitting: Internal Medicine

## 2014-10-18 DIAGNOSIS — I4891 Unspecified atrial fibrillation: Secondary | ICD-10-CM | POA: Insufficient documentation

## 2014-10-18 MED ORDER — ACETAMINOPHEN 500 MG PO TABS: 500.0000 mg | ORAL_TABLET | Freq: Four times a day (QID) | ORAL | Status: DC | PRN

## 2014-10-18 MED ORDER — ENOXAPARIN SODIUM 30 MG/0.3ML ~~LOC~~ SOLN: 30.0000 mg | Freq: Two times a day (BID) | SUBCUTANEOUS | Status: DC

## 2014-10-18 NOTE — Discharge Summary (Signed)
Patient ID: Johnny Norman MRN: [id number] DOB/AGE: [birth year] 81 y.o.  Admit date: 10/07/2014 Discharge date: 10/18/2014  Discharge Diagnoses:  Small bowel obstruction  Procedures Performed: Exploratory laparotomy with bowel resection and reanastomosis.  Discharged Condition: good  Hospital Course: Patient admitted from the ER with a small bowel dissection. Patient failed to recover with medical management and NG tube decompression. Patient underwent exporter laparotomy with bowel resection and reanastomosis. Patient tolerated procedure well and was able to tolerate a regular diet, ambulation, urination without Foley prior to discharge.  Discharge Orders:  discharge to rehabilitation. Continue physical therapy to regain strength. Keep midline wound clean and dry until staples removed. Okay to shower, do not submerge. No bathtub, hot tubs, swimming. No heavy lifting, greater than 40 pounds, for 6 weeks.  Disposition: Discharge to inpatient rehabilitation  Discharge Medications:  Current facility-administered medications:  .  [DISCONTINUED] acetaminophen (TYLENOL) tablet 650 mg, 650 mg, Oral, Q6H PRN **OR** acetaminophen (TYLENOL) suppository 650 mg, 650 mg, Rectal, Q6H PRN, Gladis Riffle, MD .  acetaminophen (TYLENOL) tablet 500 mg, 500 mg, Oral, Q6H PRN, Gladis Riffle, MD, 500 mg at 10/18/14 0949 .  aspirin EC tablet 81 mg, 81 mg, Oral, Daily, Gladis Riffle, MD, 81 mg at 10/18/14 0949 .  atorvastatin (LIPITOR) tablet 10 mg, 10 mg, Oral, q1800, Gladis Riffle, MD, 10 mg at 10/17/14 1805 .  enoxaparin (LOVENOX) injection 30 mg, 30 mg, Subcutaneous, BID, Gladis Riffle, MD, 30 mg at 10/18/14 0949 .  hydrALAZINE (APRESOLINE) injection 10 mg, 10 mg, Intravenous, Q6H PRN, Altamese Dilling, MD .  levothyroxine (SYNTHROID, LEVOTHROID) tablet 88 mcg, 88 mcg, Oral, QAC breakfast, Gladis Riffle, MD, 88 mcg at 10/18/14 0949 .  menthol-cetylpyridinium (CEPACOL)  lozenge 3 mg, 1 lozenge, Oral, PRN, Natale Lay, MD, 3 mg at 10/09/14 1116 .  mirabegron ER (MYRBETRIQ) tablet 25 mg, 25 mg, Oral, Daily, Gladis Riffle, MD, 25 mg at 10/18/14 0949 .  morphine 2 MG/ML injection 1 mg, 1 mg, Intravenous, Q1H PRN, Gladis Riffle, MD, 1 mg at 10/16/14 0355 .  nadolol (CORGARD) tablet 20 mg, 20 mg, Oral, Daily, Gladis Riffle, MD, 20 mg at 10/18/14 0949 .  ondansetron (ZOFRAN) injection 4 mg, 4 mg, Intravenous, Q4H PRN, Gladis Riffle, MD, 4 mg at 10/12/14 1011 .  pantoprazole (PROTONIX) EC tablet 40 mg, 40 mg, Oral, Daily, Ricarda Frame, MD, 40 mg at 10/18/14 0949 .  phenol (CHLORASEPTIC) mouth spray 1 spray, 1 spray, Mouth/Throat, PRN, Gladis Riffle, MD, 1 spray at 10/09/14 1733 .  promethazine (PHENERGAN) injection 6.25 mg, 6.25 mg, Intravenous, Q6H PRN, Gladis Riffle, MD  Follwup: Follow-up Information    Follow up with [facility] [place]. Schedule an appointment as soon as possible for a visit in 2 weeks.   Why:  For wound re-check   Contact information:   [address] [place] [address] [phone]      Signed: Ricarda Frame 10/18/2014, 10:41 AM

## 2014-10-18 NOTE — Progress Notes (Signed)
Pt d/c to [facility]; report called to Frann Rider, RN; IV removed, catheter in tact, gauzed dressing applied; all pt and pt family questions answered; packet given to daughter; pt left unit via wheelchair accompanied by RN and pt family; pt to be transported to [facility] via daughter Vicenta Dunning

## 2014-10-18 NOTE — Discharge Instructions (Signed)
Gangrenous Bowel Gangrenous bowel is a life-threatening condition in which parts of the intestine (also called the bowel) lose their blood supply and die. Cells in the body depend on a constant source of oxygen and nutrients in the blood. When they lose that supply, they cannot survive. Immediate treatment is needed, usually emergency surgery. Otherwise, gangrene can lead to serious complications and death. Gangrene can occur in the large intestine (colon) and small intestine. Gangrenous bowel occurs most often in people older than 50. In adults, it usually is the result of ischemic bowel disease. "Ischemic" refers to poor or no blood circulation and has a variety of causes. If the condition continues to get worse, gangrenous bowel can develop. Gangrenous bowel also can occur in infants and young children who have a bowel obstruction or twisting.  CAUSES  Common causes of ischemic bowel disease that results in gangrene include:  Strangulated hernia. This is a twisted intestinal bulge in the abdomen. It blocks blood flow and causes tissue to die.  Twisting in any part of the intestine.  Blood clots.  Cardiovascular disease, which involves the heart and the blood vessels connected to it.  Tumors. In children with gangrenous bowel, the cause is usually:  An obstruction in the intestine.  Twisting in any part of the intestine. SYMPTOMS  Gangrenous bowel symptoms include:  Intense pain in the abdomen.  Fever.  Vomiting. Symptoms of ischemic bowel disease, which can lead to gangrenous bowel, include:  Persistent abdominal pain.  Blood in the stool.  Diarrhea.  Frequent urge to have a bowel movement.  Nausea or vomiting. DIAGNOSIS Gangrenous bowel requires urgent treatment. Because of this, a caregiver might not do many tests. Instead, the patient may be rushed into emergency surgery.  If there is time, though, a caregiver can use various tests to get a precise diagnosis. Tests  can also indicate the extent of the problem. Tests that could be helpful include:  Blood tests.  X-rays.  Computed tomography (CT scan). This combines X-ray and Designer, fashion/clothing to create an image of the intestine.  Magnetic resonance angiogram (MRA). This uses radio waves and magnetic fields to check blood flow to the bowel. TREATMENT   Emergency resection (removal) of the gangrenous tissue is the main treatment for gangrenous bowel.  Antibiotics may be given both before and after surgery. In some cases, a new opening in the abdomen must be created so that waste material can empty into a bag placed on the outside of the body. This surgery is called a colostomy if the remaining part of the colon is rerouted to this opening. It is called an ileostomy if the ileum (the last part of the small intestine) is rerouted.  HOME CARE INSTRUCTIONS Normal precautions should be taken after surgery. The patient:  May need more sleep or notice a change in sleep patterns. This is normal.  Should avoid strenuous exercise. Do not lift, carry, pull, or move heavy objects (more than 5 lb [2.27 kg]) until the surgeon says it is safe.  Should keep the area around the incision dry until the stitches or staples are removed. Avoid tub baths.  May notice a small amount of clear, yellow, or reddish-yellow fluid draining from the incision for a few days after surgery. This is normal.  May experience cramping, abdominal pain, and bloating. These symptoms should improve as days go by. Eating small, frequent meals might help.  Should drink fluids to stay hydrated. (Urine should be a light yellow color.) Do  not drink alcohol while recovering from surgery. Patients who have had a colostomy or ileostomy will also have to learn to care for the opening (called a stoma) and the bag, sometimes called an appliance. Ask your caregiver for specific instructions. SEEK MEDICAL CARE IF:   Pain or discomfort in the abdomen  does not decrease after several days.  Watery diarrhea does not improve.  Food and drink cause stomach or intestinal upset or pain. SEEK IMMEDIATE MEDICAL CARE IF:   A thick, foul-smelling substance is draining from the incision. This is a warning sign of infection.  Redness develops around the incision. This could signal an infection.  The patient has persistent vomiting or severe pain anywhere in the body.  A fever of 101F (38.3C) or higher is recorded. Document Released: 10/23/2007 Document Revised: 05/30/2013 Document Reviewed: 10/23/2007 [facility] Patient Information 2015 [place], [place]. This information is not intended to replace advice given to you by your health care provider. Make sure you discuss any questions you have with your health care provider.

## 2014-10-18 NOTE — Progress Notes (Signed)
6 Days Post-Op   Subjective: 6 days status post export her laparotomy with bowel resection and anastomosis for bowel structure. Patient reports doing well this morning. He was able to void spontaneously after Foley removal yesterday. He has been ambulating with assistance. He has been tolerating a regular diet.  Vital signs in last 24 hours: Temp:  [98 F (36.7 C)-98.5 F (36.9 C)] 98.5 F (36.9 C) (09/21 0553) Pulse Rate:  [69-82] 82 (09/21 0553) Resp:  [18-20] 20 (09/21 0553) BP: (130-151)/(70-83) 151/83 mmHg (09/21 0553) SpO2:  [95 %-97 %] 95 % (09/21 0553) Weight:  [80.769 kg (178 lb 1 oz)] 80.769 kg (178 lb 1 oz) (09/21 0500) Last BM Date: 10/17/14  Intake/Output from previous day: 09/20 0701 - 09/21 0700 In: 421 [P.O.:240; I.V.:181] Out: 2000 [Urine:2000]  Gen.: No acute distress Chest: Clear to auscultation regular rate and rhythm GI: Abdomen is soft, nondistended. Staples in place the midline without any evidence of infection. Appropriately tender to palpation along the staple line.  Lab Results:  CBC  Recent Labs  10/16/14 0623  WBC 7.7  HGB 11.5*  HCT 34.0*  PLT 200   CMP     Component Value Date/Time   NA 135 10/16/2014 0623   K 3.3* 10/16/2014 0623   CL 106 10/16/2014 0623   CO2 24 10/16/2014 0623   GLUCOSE 122* 10/16/2014 0623   BUN 17 10/16/2014 0623   CREATININE 1.08 10/16/2014 0623   CALCIUM 7.9* 10/16/2014 0623   PROT 5.9* 10/08/2014 0434   PROT 6.2 10/12/2012 0815   ALBUMIN 3.5 10/08/2014 0434   AST 30 10/08/2014 0434   ALT 17 10/08/2014 0434   ALKPHOS 52 10/08/2014 0434   BILITOT 1.4* 10/08/2014 0434   GFRNONAA 60* 10/16/2014 0623   GFRAA >60 10/16/2014 0623   PT/INR No results for input(s): LABPROT, INR in the last 72 hours.  Studies/Results: No results found.  Assessment/Plan: 81 year old male status post export her laparotomy. Patient doing very well. Will plan for discharge to inpatient rehabilitation today.  Ricarda Frame,  MD FACS General Surgeon [facility]

## 2014-10-19 DIAGNOSIS — I4891 Unspecified atrial fibrillation: Secondary | ICD-10-CM | POA: Diagnosis not present

## 2014-10-19 LAB — PROTIME-INR
INR: 1.15
Prothrombin Time: 14.9 seconds (ref 11.4–15.0)

## 2014-10-24 DIAGNOSIS — I4891 Unspecified atrial fibrillation: Secondary | ICD-10-CM | POA: Diagnosis not present

## 2014-10-24 LAB — PROTIME-INR
INR: 1.96
PROTHROMBIN TIME: 22.5 s — AB (ref 11.4–15.0)

## 2014-10-27 ENCOUNTER — Encounter: Payer: Self-pay | Admitting: *Deleted

## 2014-10-28 ENCOUNTER — Encounter
Admission: RE | Admit: 2014-10-28 | Discharge: 2014-10-28 | Disposition: A | Discharge status: Home / Self Care | Payer: Medicare Other | Source: Ambulatory Visit | Attending: Internal Medicine | Admitting: Internal Medicine

## 2014-10-28 DIAGNOSIS — I4891 Unspecified atrial fibrillation: Secondary | ICD-10-CM | POA: Insufficient documentation

## 2014-10-30 ENCOUNTER — Ambulatory Visit (INDEPENDENT_AMBULATORY_CARE_PROVIDER_SITE_OTHER): Payer: Medicare Other | Admitting: Surgery

## 2014-10-30 ENCOUNTER — Encounter: Payer: Self-pay | Admitting: Surgery

## 2014-10-30 VITALS — BP 109/67 | HR 75 | Temp 98.3°F | Ht 76.0 in | Wt 168.0 lb

## 2014-10-30 DIAGNOSIS — R630 Anorexia: Secondary | ICD-10-CM

## 2014-10-30 DIAGNOSIS — K565 Intestinal adhesions [bands], unspecified as to partial versus complete obstruction: Secondary | ICD-10-CM

## 2014-10-30 MED ORDER — DRONABINOL 5 MG PO CAPS: 5.0000 mg | ORAL_CAPSULE | Freq: Two times a day (BID) | ORAL | Status: DC

## 2014-10-30 NOTE — Progress Notes (Signed)
Subjective:     Patient ID: Johnny Norman, male   DOB: 03/10/1925, 81 y.o.   MRN: [id number]  HPI  81 yr old 2 week out from Ex-Lap with SB resection for adhesive obstruction.  Patient doing well today, has been in rehab facility.  He states till some fatigue but improving.  He states his mobility is improving drastically as well.  He denies having any appetite now but denies any nausea or feeling of fullness, just doesn't feel like eating.  He has been taking elderberry for this but hasn't noticed a difference.  He is having stools that are soft and easy to pass but are more tan in color, he has not noticed any blood.  He did have diarrhea for the first couple days out but does not anymore.     Review of Systems  Constitutional: Positive for activity change, appetite change and fatigue. Negative for fever and chills.  Respiratory: Negative for cough, chest tightness and shortness of breath.   Cardiovascular: Negative for chest pain, palpitations and leg swelling.  Gastrointestinal: Negative for nausea, vomiting, abdominal pain, diarrhea, constipation, blood in stool and abdominal distention.  Genitourinary: Negative for dysuria, frequency, hematuria and flank pain.  Skin: Positive for wound. Negative for pallor.  Neurological: Negative for weakness, light-headedness and numbness.  Psychiatric/Behavioral: Negative for confusion and decreased concentration.  All other systems reviewed and are negative.  Filed Vitals:   10/30/14 0929  BP: 109/67  Pulse: 75  Temp: 98.3 F (36.8 C)      Objective:   Physical Exam  Constitutional: He is oriented to person, place, and time. He appears well-developed and well-nourished. No distress.  Cardiovascular: Normal rate, regular rhythm, normal heart sounds and intact distal pulses.  Exam reveals no gallop and no friction rub.   No murmur heard. Pulmonary/Chest: Effort normal. No respiratory distress.  Abdominal: Soft. Bowel sounds are normal. He  exhibits no distension. There is no tenderness. There is no rebound.  Midline wound, staples removed, small area of drainage in superior portion and erythema in 2cm area of inferior portion, no induration or tenderness.    Musculoskeletal: Normal range of motion. He exhibits no edema or tenderness.  Neurological: He is alert and oriented to person, place, and time.  Skin: Skin is warm and dry. No erythema.  Psychiatric: He has a normal mood and affect. His behavior is normal. Judgment and thought content normal.  Vitals reviewed.      Assessment:     81 yr old s/p Small bowel resection     Plan:     Discussed with patient and daughter that he will continue to gradually get better from a weakness, energy standpoint but that it will take some time.  Wound: continue to place gauze if pants are rubbing may have some more drainage now that staples removed.  Appetite: discussed he will still have limited appetite postoperative for 2-3 more weeks, but will give Marinol at daughter's request to see if helps.  Emphasized that he can try eating whatever he feels like, even if its not "healthy" foods, that caloric intake more important, so trying milkshakes and icecream are good.   Will have him f/u in 3 weeks for wound check and appetite check.

## 2014-10-30 NOTE — Patient Instructions (Signed)
You have a follow up appointment in the [facility] on 11/22/14 @ 0900. If you have any questions or concerns, please call our office.

## 2014-10-31 DIAGNOSIS — I4891 Unspecified atrial fibrillation: Secondary | ICD-10-CM | POA: Diagnosis present

## 2014-10-31 LAB — PROTIME-INR
INR: 2.58
PROTHROMBIN TIME: 27.8 s — AB (ref 11.4–15.0)

## 2014-11-21 ENCOUNTER — Encounter: Payer: Self-pay | Admitting: Cardiovascular Disease

## 2014-11-22 ENCOUNTER — Ambulatory Visit (INDEPENDENT_AMBULATORY_CARE_PROVIDER_SITE_OTHER): Payer: Medicare Other | Admitting: Surgery

## 2014-11-22 ENCOUNTER — Encounter: Payer: Self-pay | Admitting: Surgery

## 2014-11-22 VITALS — BP 115/67 | HR 81 | Temp 97.6°F | Ht 76.0 in | Wt 171.6 lb

## 2014-11-22 DIAGNOSIS — K565 Intestinal adhesions [bands], unspecified as to partial versus complete obstruction: Secondary | ICD-10-CM

## 2014-11-22 NOTE — Progress Notes (Signed)
Subjective:     Patient ID: Johnny Norman, male   DOB: 1925/05/14, 81 y.o.   MRN: [id number]  HPI  81 yr old male s/p Ex lap and small bowel resection for SBO.  Patient doing well, now 5 weeks out.  He has been doing much better.  He is at home now and is walking without any assist device.  Patient states strength is back as well.  He is having normal soft BMs, about one every other day.  His appetite has also returned and he is no longer taking the marinol.  He does not have any pain today.    Review of Systems  Constitutional: Negative for fever, chills, activity change, appetite change, fatigue and unexpected weight change.  HENT: Negative for congestion and sore throat.   Respiratory: Negative for cough, shortness of breath and wheezing.   Cardiovascular: Negative for chest pain, palpitations and leg swelling.  Gastrointestinal: Negative for nausea, vomiting, abdominal pain, diarrhea, constipation and blood in stool.  Genitourinary: Negative for dysuria and hematuria.  Musculoskeletal: Negative for back pain and joint swelling.  Skin: Negative for color change, pallor, rash and wound.  Neurological: Negative for tremors, seizures and weakness.  Hematological: Negative for adenopathy. Does not bruise/bleed easily.   Filed Vitals:   11/22/14 0847  BP: 115/67  Pulse: 81  Temp: 97.6 F (36.4 C)       Objective:   Physical Exam  Constitutional: He is oriented to person, place, and time. He appears well-developed and well-nourished. No distress.  Cardiovascular: Normal rate and regular rhythm.   Pulmonary/Chest: Effort normal. No respiratory distress. He has no wheezes.  Abdominal: Soft. Bowel sounds are normal. He exhibits no distension. There is no tenderness. There is no rebound.  Well healed midline scar, no tenderness   Neurological: He is alert and oriented to person, place, and time. No cranial nerve deficit.  Skin: Skin is warm and dry. No erythema.  Psychiatric: He has  a normal mood and affect. His behavior is normal. Thought content normal.  Vitals reviewed.     Assessment:     81 yr old male s/p Ex lap and small bowel resection    Plan:     Doing well postoperatively, almost back to normal. Instructed patient not to lift anything heavy for 3 more weeks, then can return to doing house work and exercises.  Also instructed on the importance of drinking plenty of water.  He may RTC prn.

## 2015-01-18 ENCOUNTER — Encounter: Payer: Self-pay | Admitting: Podiatry

## 2015-01-18 ENCOUNTER — Ambulatory Visit (INDEPENDENT_AMBULATORY_CARE_PROVIDER_SITE_OTHER): Payer: Medicare Other | Admitting: Podiatry

## 2015-01-18 VITALS — BP 115/70 | HR 79 | Resp 12

## 2015-01-18 DIAGNOSIS — L600 Ingrowing nail: Secondary | ICD-10-CM

## 2015-01-18 DIAGNOSIS — M79676 Pain in unspecified toe(s): Secondary | ICD-10-CM

## 2015-01-18 DIAGNOSIS — B351 Tinea unguium: Secondary | ICD-10-CM | POA: Diagnosis not present

## 2015-01-18 NOTE — Progress Notes (Signed)
Patient ID: Maudie FlakesRichard E Tormey, male   DOB: [birth year], 81 y.o.   MRN: [id number]  Subjective: 81 y.o. returns the office today for painful, elongated toenails that she cannot trim himself. He states that he has an ingrown toenail on the left big toenail is sore mostly with pressure in shoes. Denies any redness, swelling, drainage or other signs of infection. Denies any redness or drainage around the nails. Denies any acute changes since last appointment and no new complaints today. Denies any systemic complaints such as fevers, chills, nausea, vomiting. He has been using tea tree oil on his toenails which seems to help.  Objective: AAO 3, NAD DP/PT pulses palpable, CRT less than 3 seconds Nails hypertrophic, dystrophic, elongated, brittle, discolored 10. There is incurvation on the lateral aspect of the left hallux toenail on the distal portion with tenderness overlying that area. There is tenderness overlying the nails 1-5 bilaterally. There is no surrounding erythema or drainage along the nail sites. No open lesions or pre-ulcerative lesions are identified. No other areas of tenderness bilateral lower extremities. No overlying edema, erythema, increased warmth. No pain with calf compression, swelling, warmth, erythema.  Assessment: Patient presents with symptomatic onychomycosis  Plan: -Treatment options including alternatives, risks, complications were discussed -Nails sharply debrided 10 without complication/bleeding. I discussed partial nail avulsion to the left hallux however he elected to proceed with nail debridement. The symptomatic portion of the ingrown toenail was removed. There is no bleeding. Epson salt soaks. Continue to monitor for any recurrence of his any continued pain likely need partial nail avulsion. -Discussed daily foot inspection. If there are any changes, to call the office immediately.  -Follow-up in 3 months or sooner if any problems are to arise. In the meantime,  encouraged to call the office with any questions, concerns, changes symptoms.  Ovid CurdMatthew Wagoner, DPM

## 2015-10-04 ENCOUNTER — Telehealth: Payer: Self-pay | Admitting: Cardiovascular Disease

## 2015-10-04 NOTE — Telephone Encounter (Signed)
Dr. Fredna DowMasoud's office called questioning pt appointment on 10/08/2015. They state pt sees Dr. Juel BurrowMasoud for cardiology and pt does not need to see Dr. Mariah MillingGollan. I contacted patient and patient states he Does want to continue to see Dr. Mariah MillingGollan.

## 2015-10-08 ENCOUNTER — Encounter: Payer: Self-pay | Admitting: Cardiovascular Disease

## 2015-10-08 ENCOUNTER — Ambulatory Visit (INDEPENDENT_AMBULATORY_CARE_PROVIDER_SITE_OTHER): Payer: Medicare Other | Admitting: Cardiovascular Disease

## 2015-10-08 VITALS — BP 128/80 | HR 82 | Ht 76.0 in | Wt 174.5 lb

## 2015-10-08 DIAGNOSIS — Z7189 Other specified counseling: Secondary | ICD-10-CM | POA: Diagnosis not present

## 2015-10-08 DIAGNOSIS — R002 Palpitations: Secondary | ICD-10-CM

## 2015-10-08 DIAGNOSIS — E785 Hyperlipidemia, unspecified: Secondary | ICD-10-CM

## 2015-10-08 DIAGNOSIS — I4891 Unspecified atrial fibrillation: Secondary | ICD-10-CM | POA: Diagnosis not present

## 2015-10-08 DIAGNOSIS — I495 Sick sinus syndrome: Secondary | ICD-10-CM | POA: Diagnosis not present

## 2015-10-08 NOTE — Patient Instructions (Addendum)
Medication Instructions:   No medication changes made  Please take lasix as needed for ankle swelling   Labwork:  No new labs needed  Testing/Procedures:  No further testing at this time   Follow-Up: It was a pleasure seeing you in the office today. Please call us if you have new issues that need to be addressed before your next appt.  [phone]  Your physician wants you to follow-up in: 12 months.  You will receive a reminder letter in the mail two months in advance. If you don't receive a letter, please call our office to schedule the follow-up appointment.  If you need a refill on your cardiac medications before your next appointment, please call your pharmacy.

## 2015-10-08 NOTE — Progress Notes (Signed)
Cardiology Office Note  Date:  10/08/2015   ID:  Johnny Norman, DOB 1926-01-26, MRN [id number]  PCP:  Corky Downs, MD   Chief Complaint  Patient presents with  . Other    1 yr f/u no complaints today. Meds reviewed verbally with pt.    HPI:  Johnny Norman is a very pleasant 82 year old gentleman with history of sick sinus syndrome, atrial fibrillation, pacemaker placed by Dr. Juel Burrow in 2010  who presents for routine followup for atrial fibrillation.  In general he reports he has been feeling well Previously reported having palpitations in the evenings, these have significantly improved Exercises on a daily basis He does report that when he Bends over and come up, sees "lightning bolts in eyes" for a few minutes Otherwise feels well.  Dr. Juel Burrow checks pacers  Previous lab work 03/14/2013 lab work showing total cholesterol 148, LDL 73, HDL 49 Labs 04/2014: total chol  148, ldl 82 No recent lab work available  EKG on today's visit shows paced rhythm 82 bpm  Other medical history Previous treadmill stress test which showed no EKG changes concerning for ischemia.  Recent echocardiogram also done by Dr. Harl Bowie showed no significant valvular pathology, good ejection fraction  Normal ABIs in the past Previously seen by Dr. Evette Cristal who felt his leg pain was orthopedic related.  Holter monitor in 2010 that showed periods of tachy arrhythmia. He was started on metoprolol at that time with improvement of his symptoms  PMH:   has a past medical history of A-fib [facility]); CHF (congestive heart failure) (HCC); Claudication [facility]); Hyperlipidemia; Small bowel obstruction (HCC); and Thyroid disease.  PSH:    Past Surgical History:  Procedure Laterality Date  . APPENDECTOMY    . BOWEL RESECTION  10/12/2014   Procedure: SMALL BOWEL RESECTION;  Surgeon: Gladis Riffle, MD;  Location: [facility] ORS;  Service: General;;  . CHOLECYSTECTOMY    . COLONOSCOPY    . INSERT / REPLACE / REMOVE  PACEMAKER  09/13/2008   SSS s/p pacemaker implant; Medtronic pacemaker,.  . LAPAROTOMY N/A 10/12/2014   Procedure: EXPLORATORY LAPAROTOMY;  Surgeon: Gladis Riffle, MD;  Location: [facility] ORS;  Service: General;  Laterality: N/A;  . PROSTATE SURGERY      Current Outpatient Prescriptions  Medication Sig Dispense Refill  . acetaminophen (TYLENOL) 500 MG tablet Take 1 tablet (500 mg total) by mouth every 6 (six) hours as needed for moderate pain. 30 tablet 0  . aspirin 81 MG tablet Take 81 mg by mouth daily.    Marland Kitchen atorvastatin (LIPITOR) 10 MG tablet Take 10 mg by mouth daily.    . cholecalciferol (VITAMIN D) 1000 UNITS tablet Take 1,000 Units by mouth daily.    . Flaxseed, Linseed, (FLAXSEED OIL) 1000 MG CAPS Take 1,000 mg by mouth daily.    Marland Kitchen levothyroxine (LEVOTHROID) 88 MCG tablet Take 88 mcg by mouth daily.    . mirabegron ER (MYRBETRIQ) 50 MG TB24 tablet Take 50 mg by mouth 2 (two) times daily.    . Multiple Vitamin (MULTIVITAMIN) tablet Take 1 tablet by mouth daily.    . nadolol (CORGARD) 20 MG tablet Take 20 mg by mouth daily.     . Omega-3 Fatty Acids (FISH OIL) 1200 MG CAPS Take by mouth daily.    Marland Kitchen warfarin (COUMADIN) 5 MG tablet Take 5 mg by mouth daily.    . furosemide (LASIX) 20 MG tablet Take 1 tablet (20 mg total) by mouth daily as needed. 90 tablet 3  No current facility-administered medications for this visit.      Allergies:   Review of patient's allergies indicates no known allergies.   Social History:  The patient  reports that he has never smoked. He has never used smokeless tobacco. He reports that he does not drink alcohol or use drugs.   Family History:   Family history is unknown by patient.    Review of Systems: Review of Systems  Constitutional: Negative.   Eyes:       Visual problem  Respiratory: Negative.   Cardiovascular: Negative.   Gastrointestinal: Negative.   Musculoskeletal: Negative.   Neurological: Negative.   Psychiatric/Behavioral:  Negative.   All other systems reviewed and are negative.    PHYSICAL EXAM: VS:  BP 128/80 (BP Location: Left Arm, Patient Position: Sitting, Cuff Size: Normal)   Pulse 82   Ht 6\' 4"  (1.93 m)   Wt 174 lb 8 oz (79.2 kg)   BMI 21.24 kg/m  , BMI Body mass index is 21.24 kg/m. GEN: Well nourished, well developed, in no acute distress  HEENT: normal  Neck: no JVD, carotid bruits, or masses Cardiac: RRR; no murmurs, rubs, or gallops,no edema  Respiratory:  clear to auscultation bilaterally, normal work of breathing GI: soft, nontender, nondistended, + BS MS: no deformity or atrophy  Skin: warm and dry, no rash Neuro:  Strength and sensation are intact Psych: euthymic mood, full affect    Recent Labs: 10/16/2014: BUN 17; Creatinine, Ser 1.08; Hemoglobin 11.5; Platelets 200; Potassium 3.3; Sodium 135    Lipid Panel Lab Results  Component Value Date   CHOL 159 10/12/2012   HDL 59 10/12/2012   LDLCALC 83 10/12/2012   TRIG 87 10/12/2012      Wt Readings from Last 3 Encounters:  10/08/15 174 lb 8 oz (79.2 kg)  11/22/14 171 lb 9.6 oz (77.8 kg)  10/30/14 168 lb (76.2 kg)       ASSESSMENT AND PLAN:  Atrial fibrillation, unspecified type (HCC) - Plan: EKG 12-Lead Appears 3 maintaining normal sinus rhythm, Paced rhythm on EKG Encouraged him to stay on his warfarin, also takes nadolol  Hyperlipidemia  No changes to the medications were made. Most recent lipid panel not available Previously at goal  Sick sinus syndrome (HCC) History of pacemaker, followed by Dr. Juel BurrowMasoud  Encounter for anticoagulation discussion and counseling Discussed his warfarin, denies any significant bleeding or complications  Palpitations  previously had palpitations. Reports that symptoms have resolved No changes to his medications   Total encounter time more than 25 minutes  Greater than 50% was spent in counseling and coordination of care with the patient  Disposition:   F/U  12  months   Orders Placed This Encounter  Procedures  . EKG 12-Lead     Signed, Dossie Arbourim Cordella Nyquist, M.D., Ph.D. 10/08/2015  [facility], [place] [phone]

## 2016-03-18 ENCOUNTER — Ambulatory Visit
Admission: RE | Admit: 2016-03-18 | Discharge: 2016-03-18 | Disposition: A | Discharge status: Home / Self Care | Payer: Medicare Other | Source: Ambulatory Visit | Attending: Internal Medicine | Admitting: Internal Medicine

## 2016-03-18 ENCOUNTER — Other Ambulatory Visit: Payer: Self-pay | Admitting: Internal Medicine

## 2016-03-18 DIAGNOSIS — R52 Pain, unspecified: Secondary | ICD-10-CM

## 2016-03-18 DIAGNOSIS — M19072 Primary osteoarthritis, left ankle and foot: Secondary | ICD-10-CM | POA: Diagnosis not present

## 2016-03-18 DIAGNOSIS — M25572 Pain in left ankle and joints of left foot: Secondary | ICD-10-CM | POA: Diagnosis present

## 2016-04-02 IMAGING — CT CT ABD-PELV W/ CM
1 of 3 series · 14 of 32 positions shown, 19 images · IV contrast (omnipaque)
Comparison: None.

CLINICAL DATA: Periumbilical to left-sided abdominal pain beginning
this morning.

EXAM:
CT ABDOMEN AND PELVIS WITH CONTRAST
TECHNIQUE: Multidetector CT imaging of the abdomen and pelvis was performed
using the standard protocol following bolus administration of
intravenous contrast.
CONTRAST:  100mL OMNIPAQUE IOHEXOL 300 MG/ML  SOLN

[Series 2: routine abd pel with · axial · 0.65mm/px · z∈[-291,+84]mm · 14 of 85 slices shown, 19 images]
[im 5/85  soft-tissue]
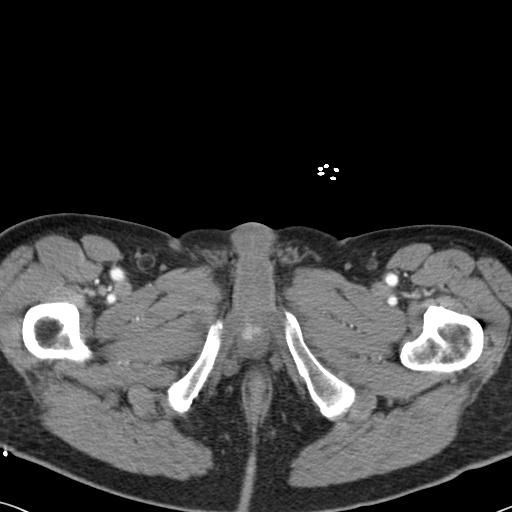
[im 5/85  bone]
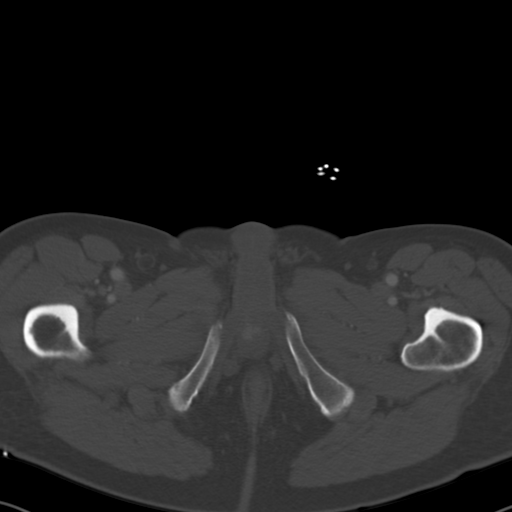
[im 14/85  soft-tissue]
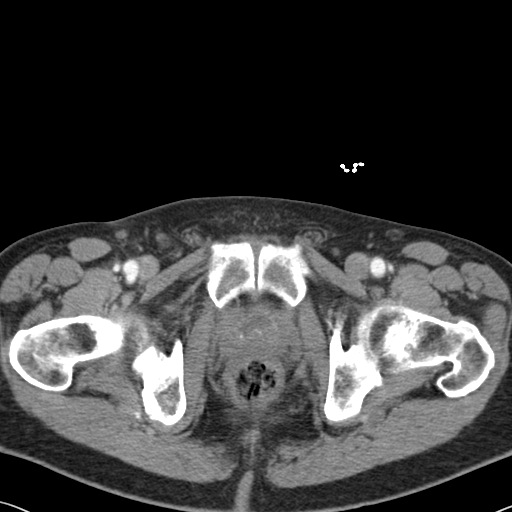
[im 18/85  soft-tissue]
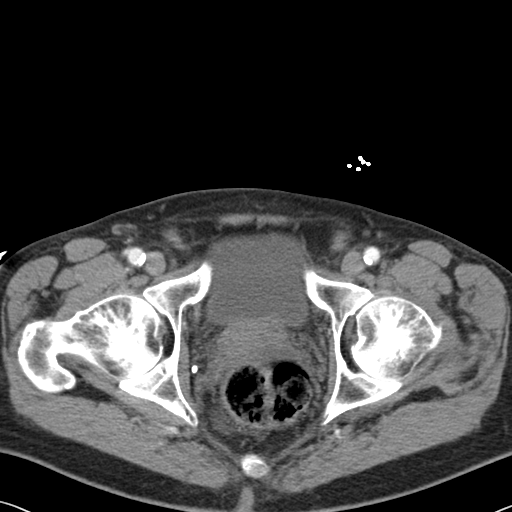
[im 23/85  soft-tissue]
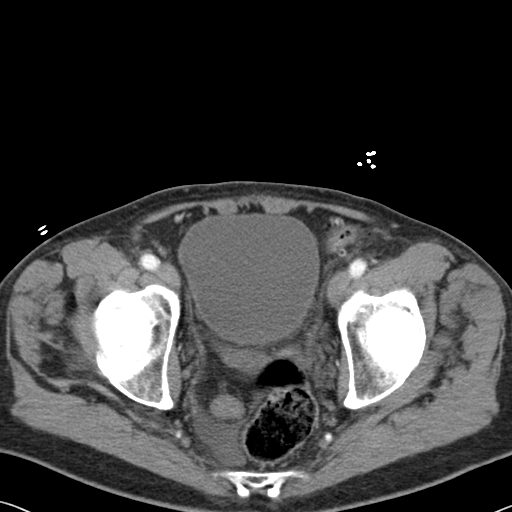
[im 31/85  soft-tissue]
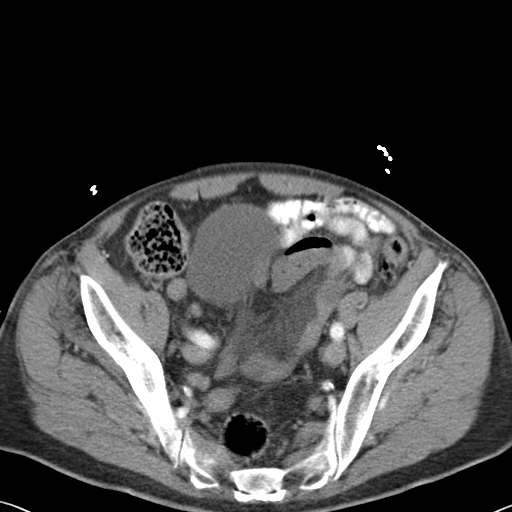
[im 36/85  soft-tissue]
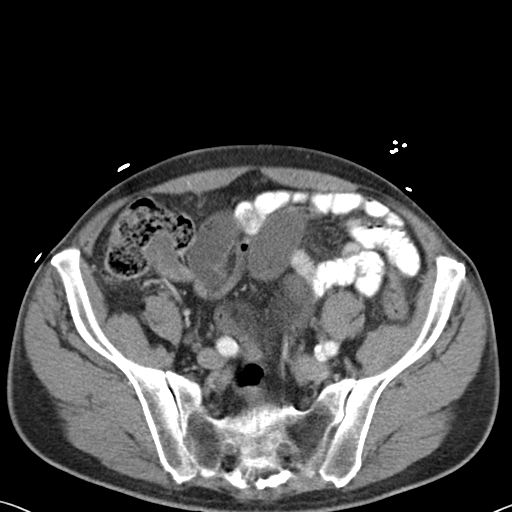
[im 45/85  soft-tissue]
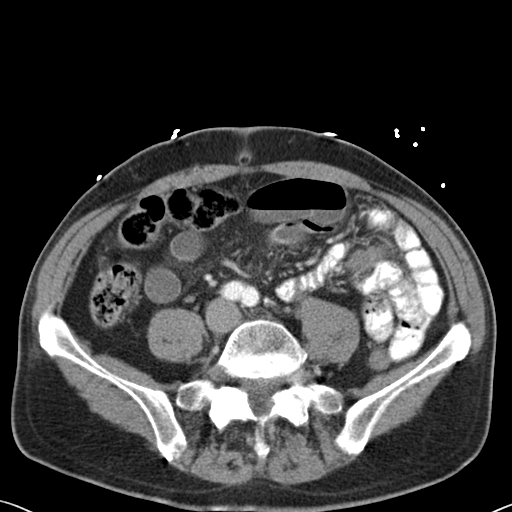
[im 49/85  soft-tissue]
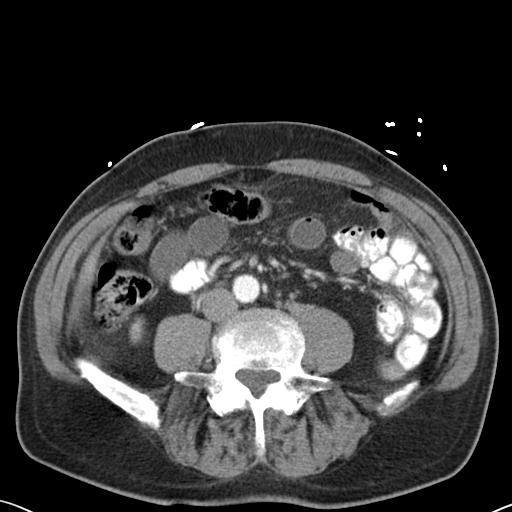
[im 54/85  soft-tissue]
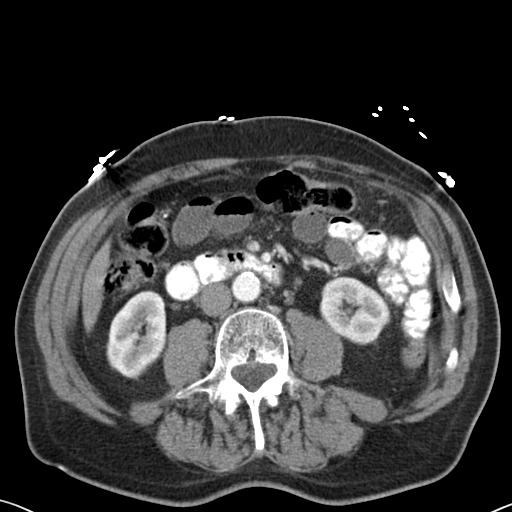
[im 54/85  bone]
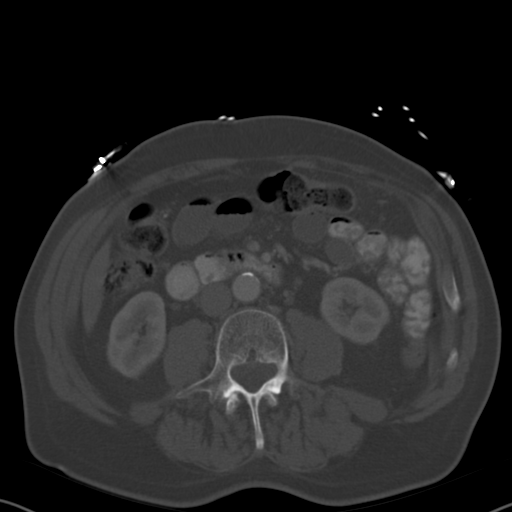
[im 62/85  soft-tissue]
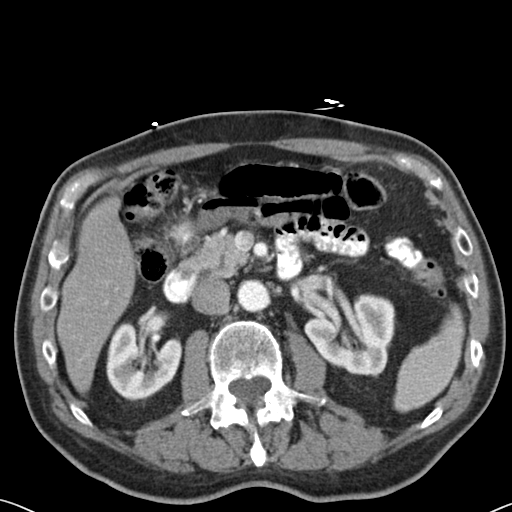
[im 67/85  soft-tissue]
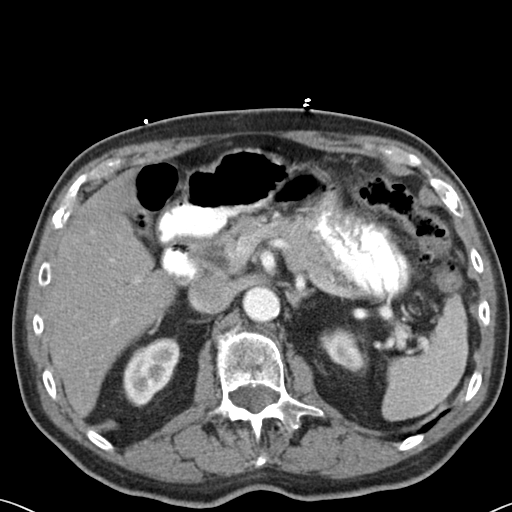
[im 67/85  lung]
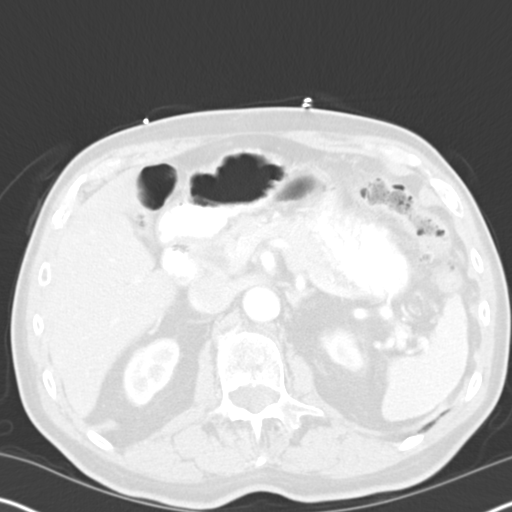
[im 71/85  soft-tissue]
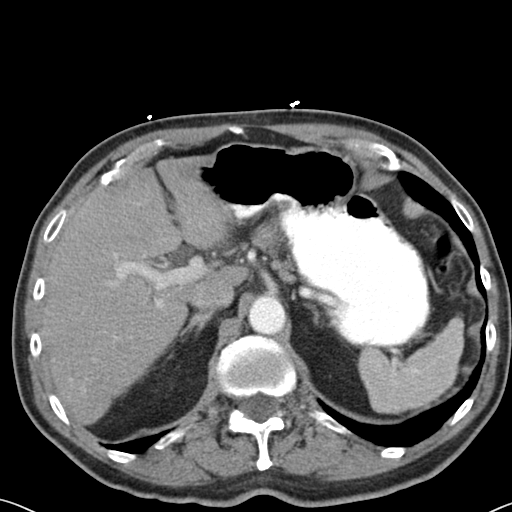
[im 71/85  lung]
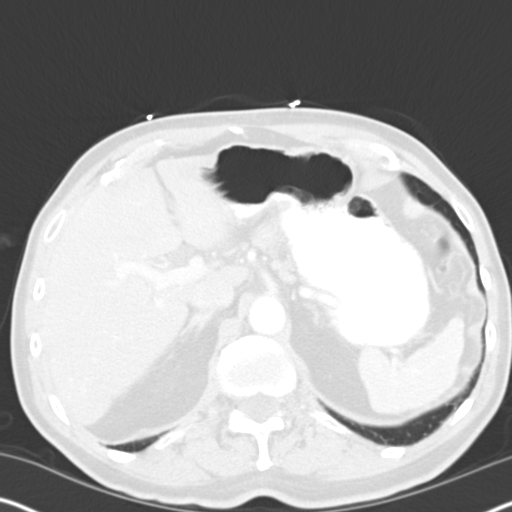
[im 76/85  lung]
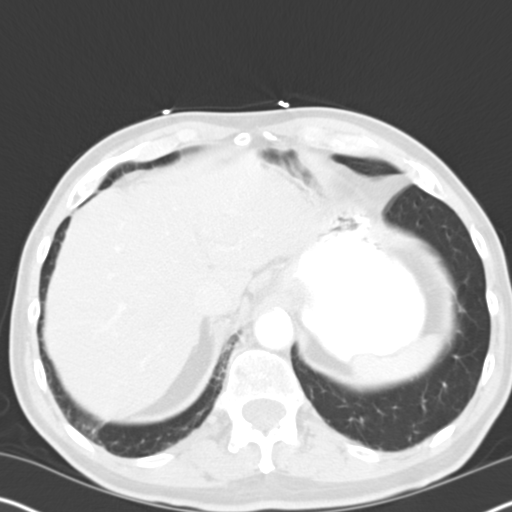
[im 80/85  soft-tissue]
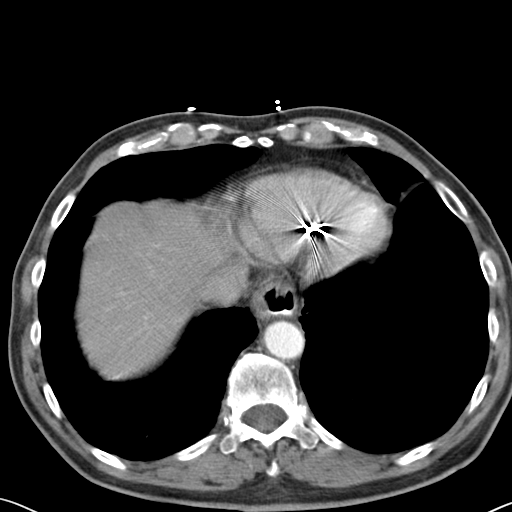
[im 80/85  lung]
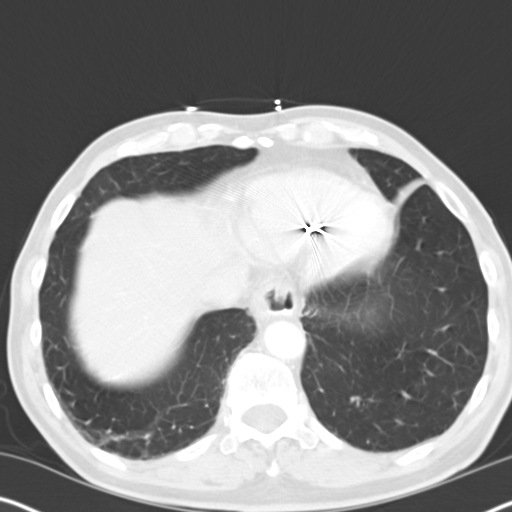

[14 of 32 positions shown; findings below may reference images not displayed]

FINDINGS: Lower chest and abdominal wall: Tiny hiatal hernia. Right
ventricular pacer lead noted. No acute finding.

Hepatobiliary: No focal liver abnormality.Cholecystectomy which
accounts for mild common bile duct enlargement. No calcified
choledocholithiasis.

Pancreas: Unremarkable.

Spleen: Lobulated spleen without focal mass or enlargement.

Adrenals/Urinary Tract: Negative adrenals. No hydronephrosis or
stone. Sub cm low-density from the lower pole right kidney appears
simple on coronal reformats and favors an incidental cyst,
evaluation limited by small size. Moderately distended. No acute
findings.

Reproductive:Prostatomegaly projecting into the bladder base.
Central lucency compatible with previous TURP.

Stomach/Bowel: There is dilated mid small bowel with fluid levels
and abrupt transition to decompressed small bowel, with twisting
seen on image 52 of series 2. This twist is also confirmed on
coronal image 55, affecting 2 segments of bowel in this location.
Appearance suggests adhesive disease. No overt twisting of the
mesentery, which is congested. No evidence of pneumatosis or
perforation.

Extensive distal colonic diverticulosis.

Vascular/Lymphatic: No acute vascular abnormality. No mass or
adenopathy.

Peritoneal: No pneumoperitoneum.

Musculoskeletal: No acute abnormalities.
IMPRESSION: 1. Mid small bowel obstruction with pelvic transition point. Mild
small bowel distention is likely from recent onset rather than
partial obstruction.
2. Chronic findings are noted above.

## 2016-04-02 IMAGING — CR DG ABD PORTABLE 1V
1 series · 1 of 1 positions shown · non-contrast
Comparison: Same date at [DATE] p.m.

CLINICAL DATA: NG tube placement

EXAM:
PORTABLE ABDOMEN - 1 VIEW

[ap]
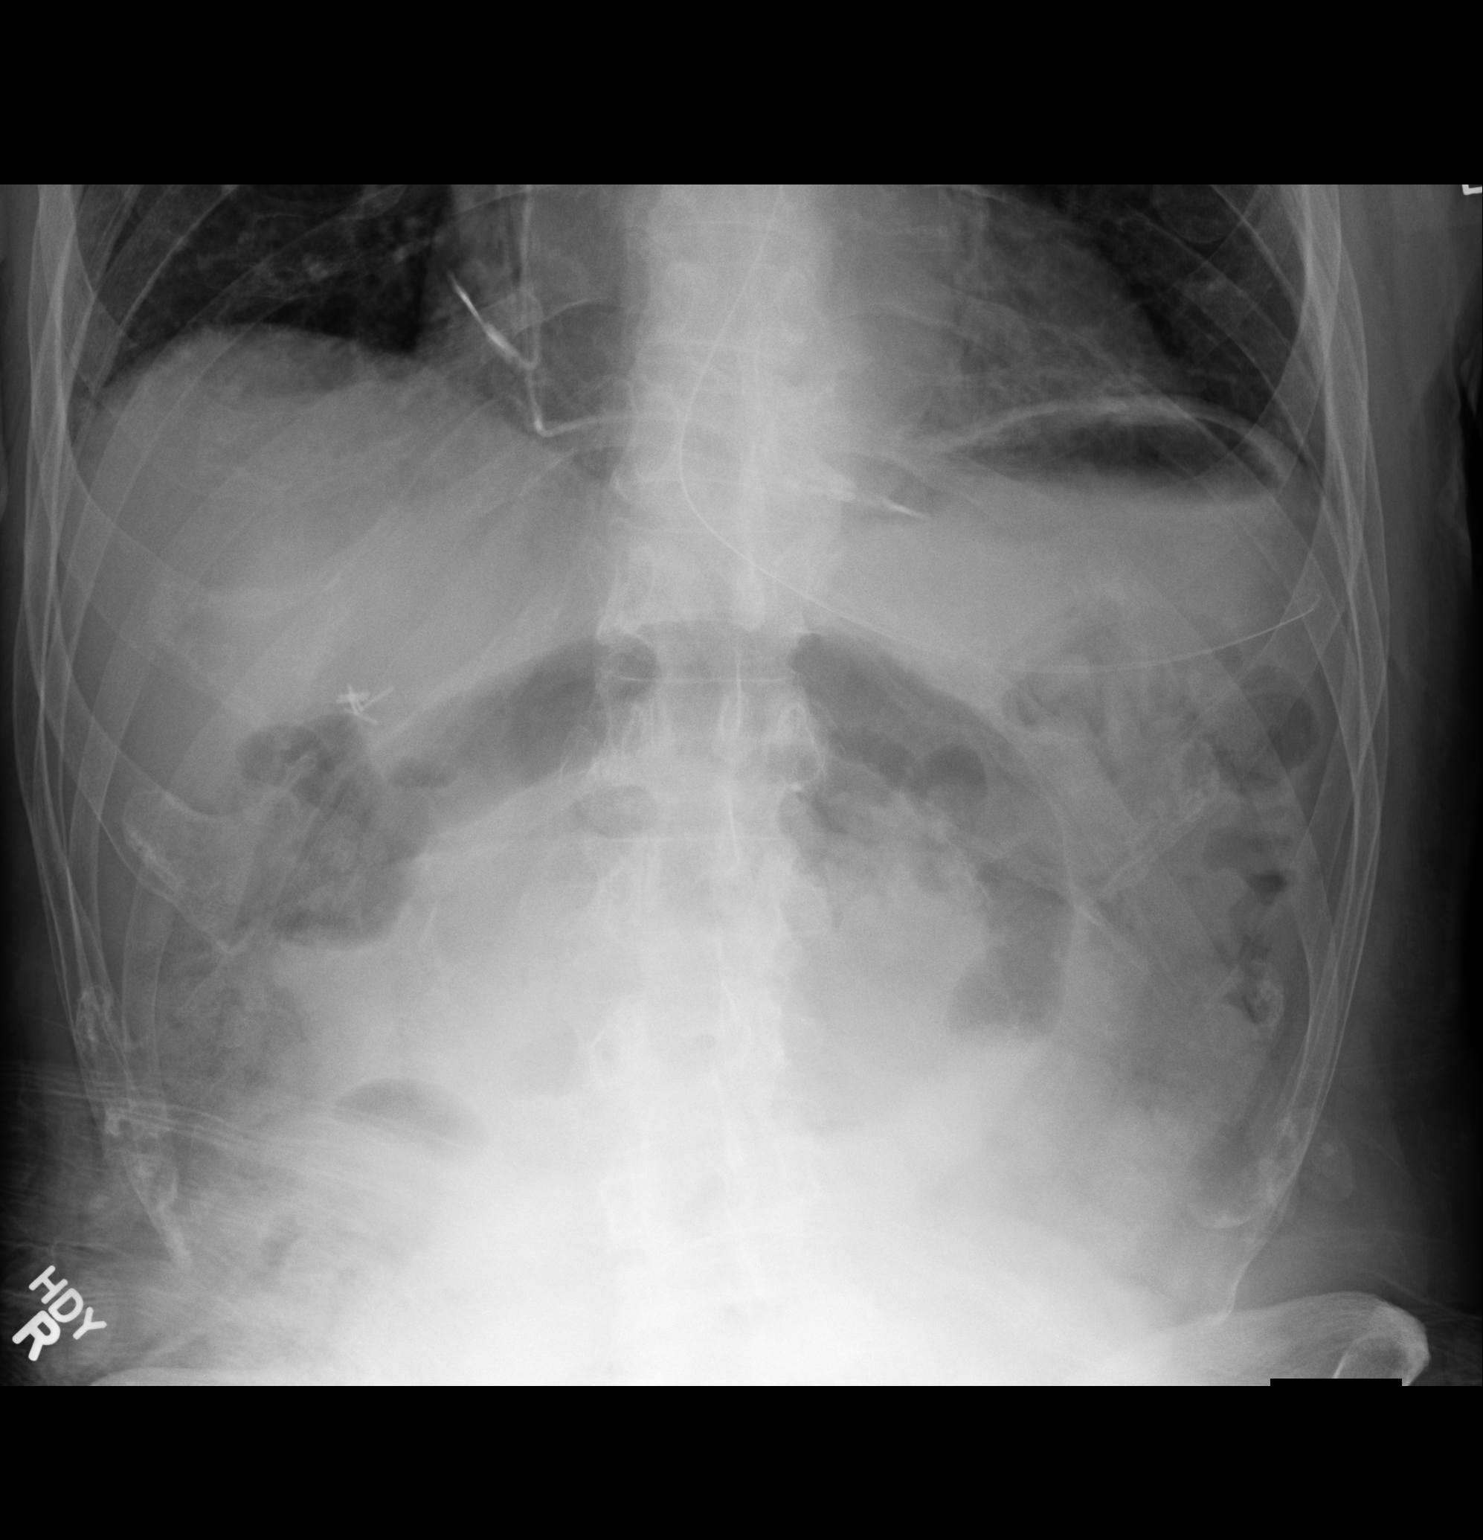

[1 of 1 positions shown; findings below may reference images not displayed]

FINDINGS: NG tube tip terminates over the left upper quadrant over the
expected location of the greater curvature of the stomach. No change
otherwise.
IMPRESSION: Nasogastric tube tip over the expected location of the greater
curvature of the stomach.

## 2016-04-03 IMAGING — CR DG ABDOMEN 1V
1 series · 2 of 2 positions shown · non-contrast
Comparison: October 08, 2014.

CLINICAL DATA: Vomiting.

EXAM:
ABDOMEN - 1 VIEW

[Series 1: dg abd 1 view · 0.14mm/px · 2 of 2 slices shown]
[im 1/2]
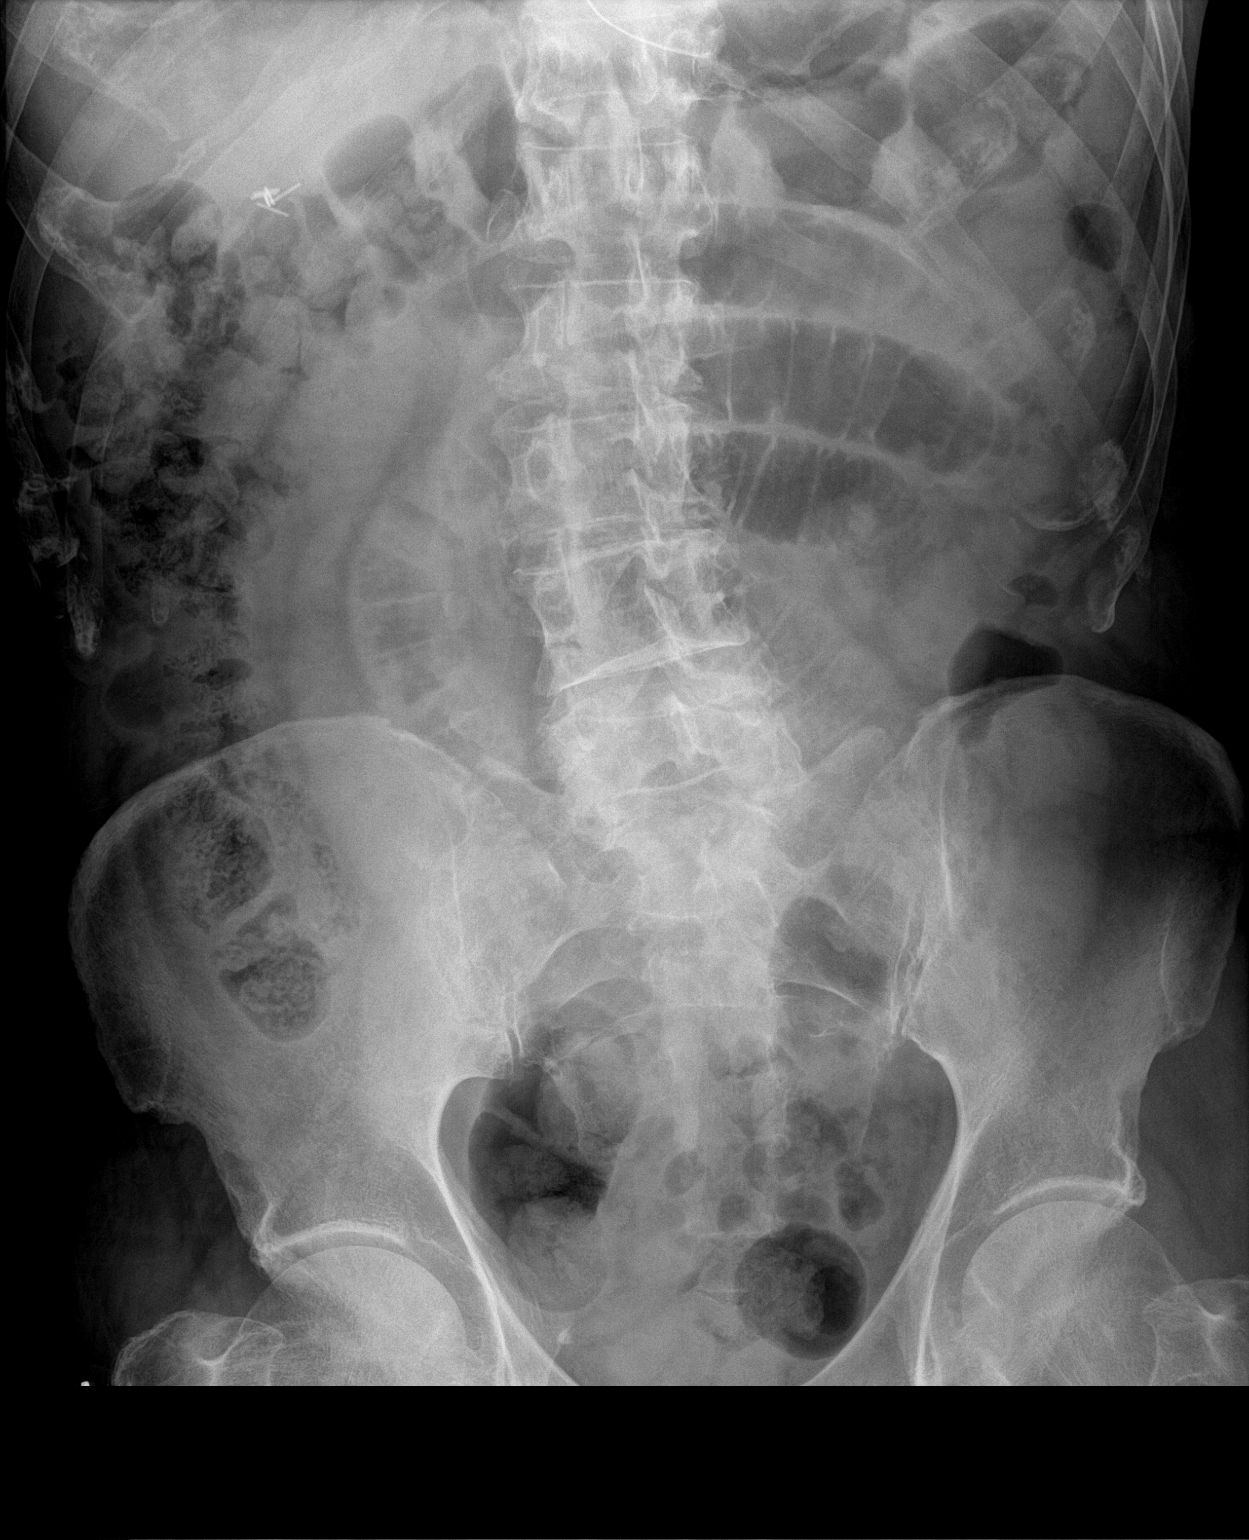
[im 2/2]
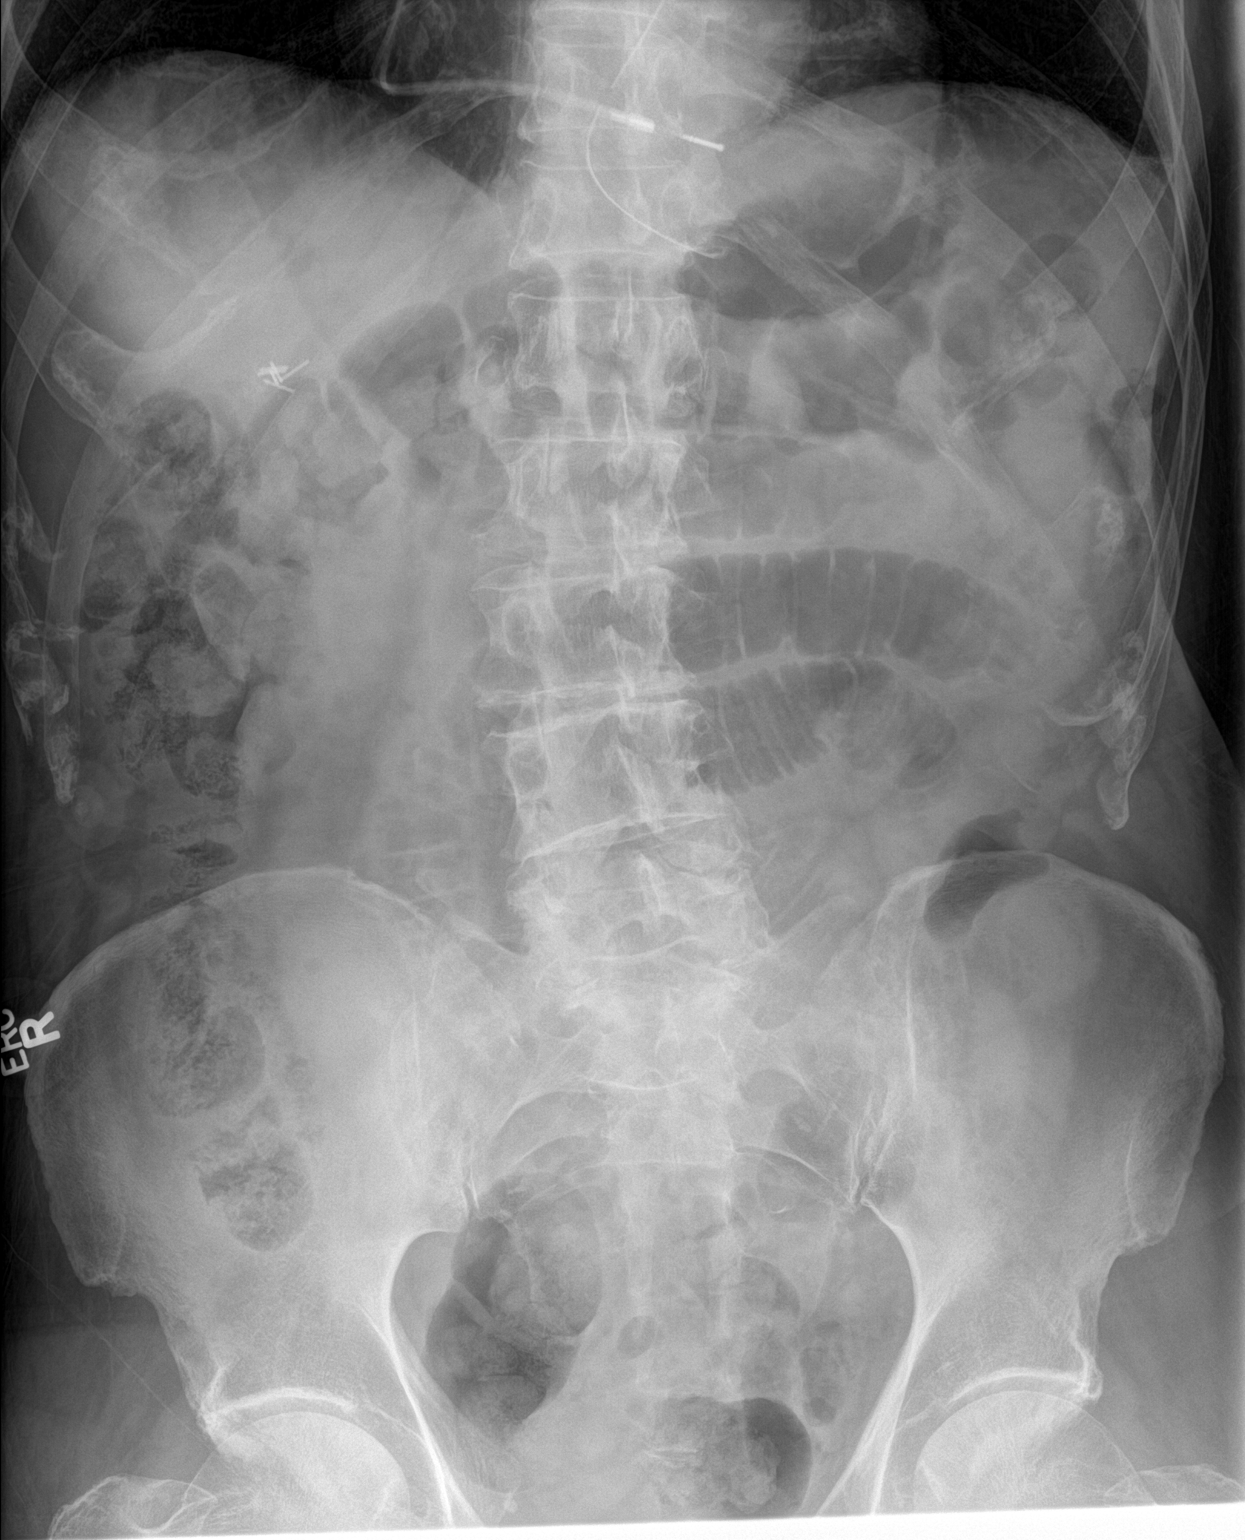

[2 of 2 positions shown; findings below may reference images not displayed]

FINDINGS: Status post cholecystectomy. Continued presence of mildly dilated
small bowel loops concerning for possible distal small bowel
obstruction. Stool is noted in the colon. Phleboliths are noted in
the pelvis.
IMPRESSION: Continued presence of mildly dilated small bowel loops concerning
for distal small bowel obstruction. Continued radiographic follow-up
is recommended.

## 2016-04-03 IMAGING — CR DG ABDOMEN ACUTE W/ 1V CHEST
1 series · 4 of 4 positions shown · non-contrast
Comparison: Abdominal CT 10/07/2014, chest x-ray 10/07/2014,
abdominal plain film 10/07/2014

CLINICAL DATA: 89-year-old male with a history of pain. Nausea and
vomiting.

EXAM:
DG ABDOMEN ACUTE W/ 1V CHEST

[Series 1: dg abd acute w/chest · 0.14mm/px · 4 of 4 slices shown]
[im 1/4]
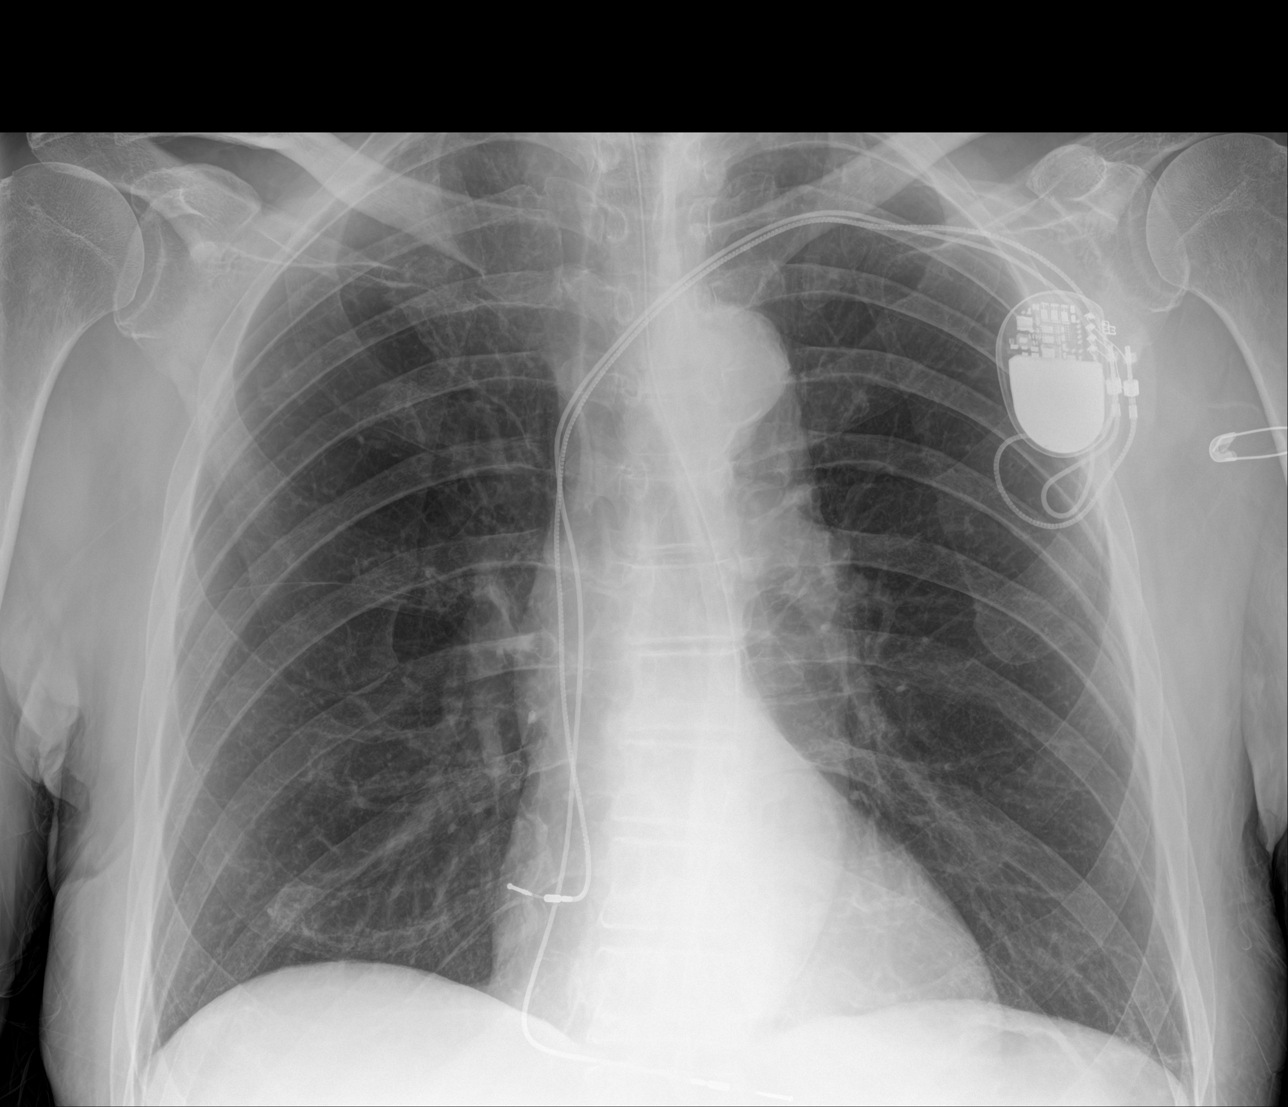
[im 2/4]
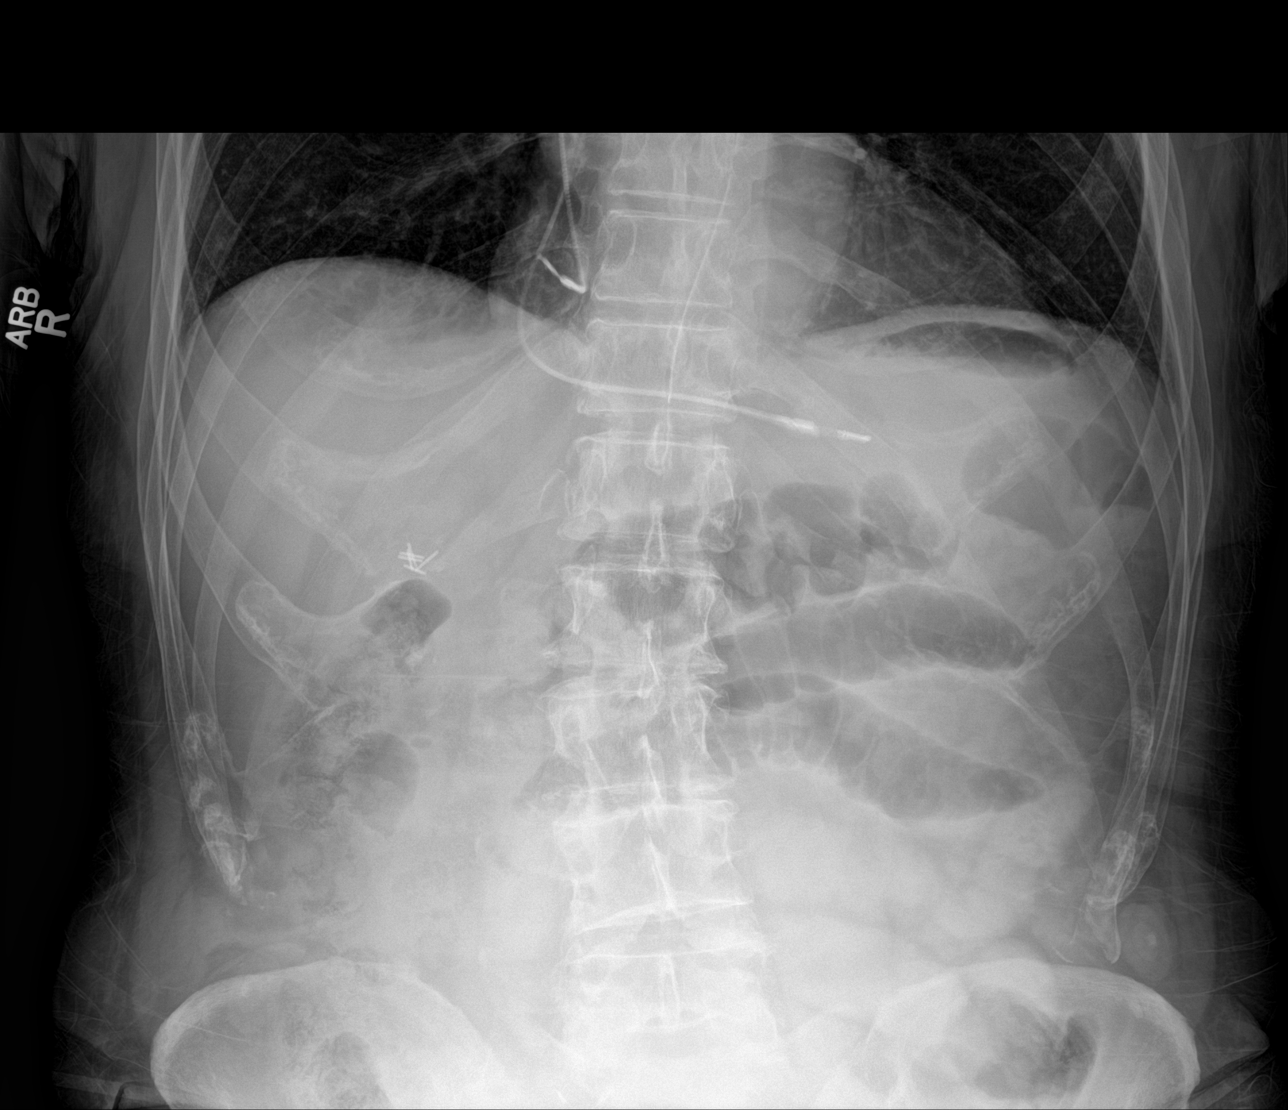
[im 3/4]
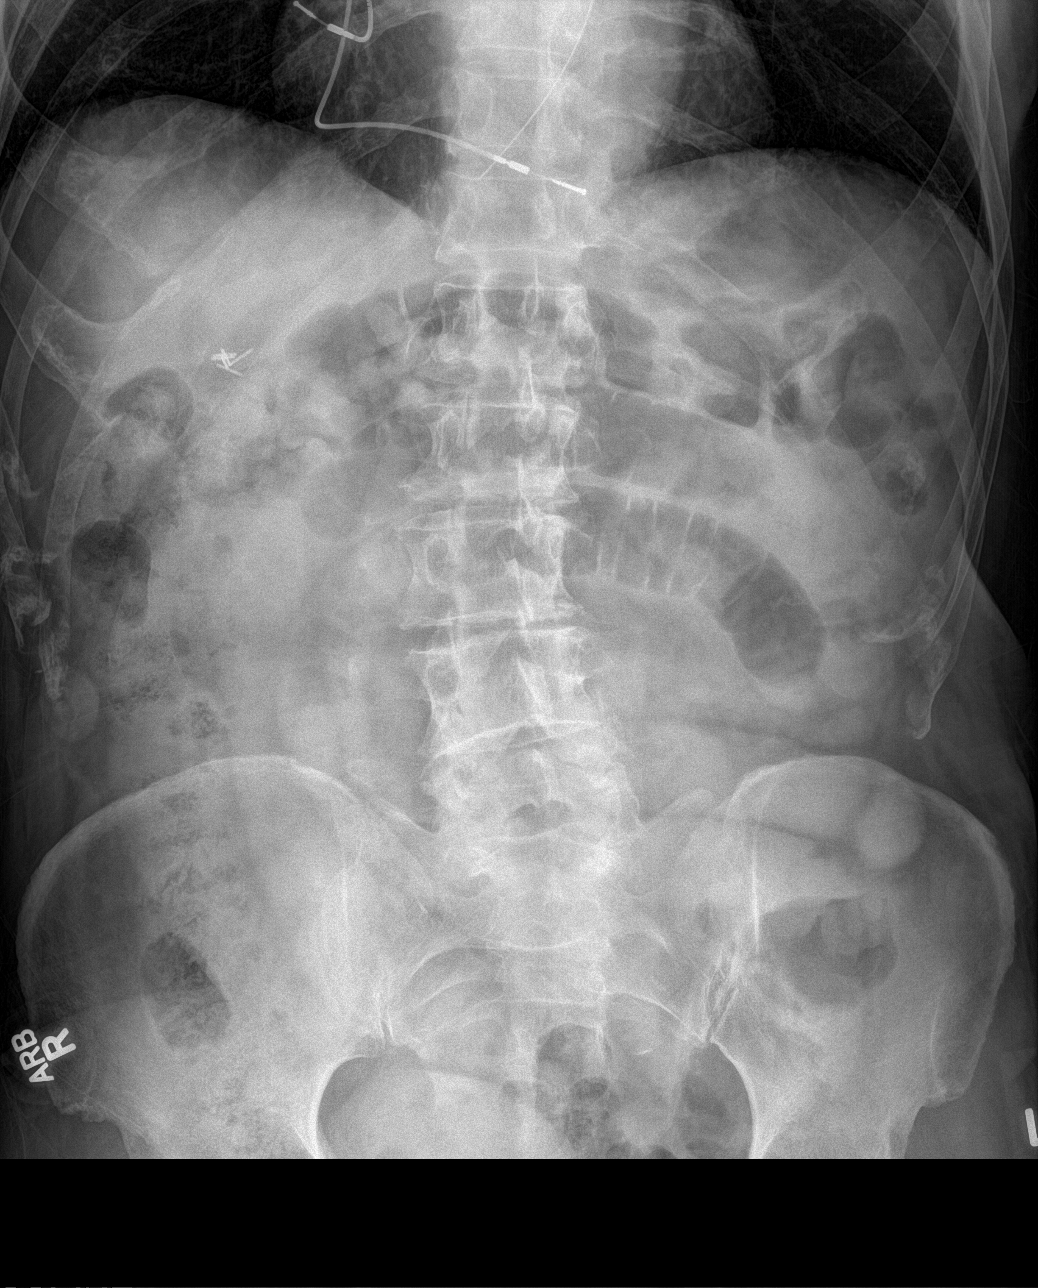
[im 4/4]
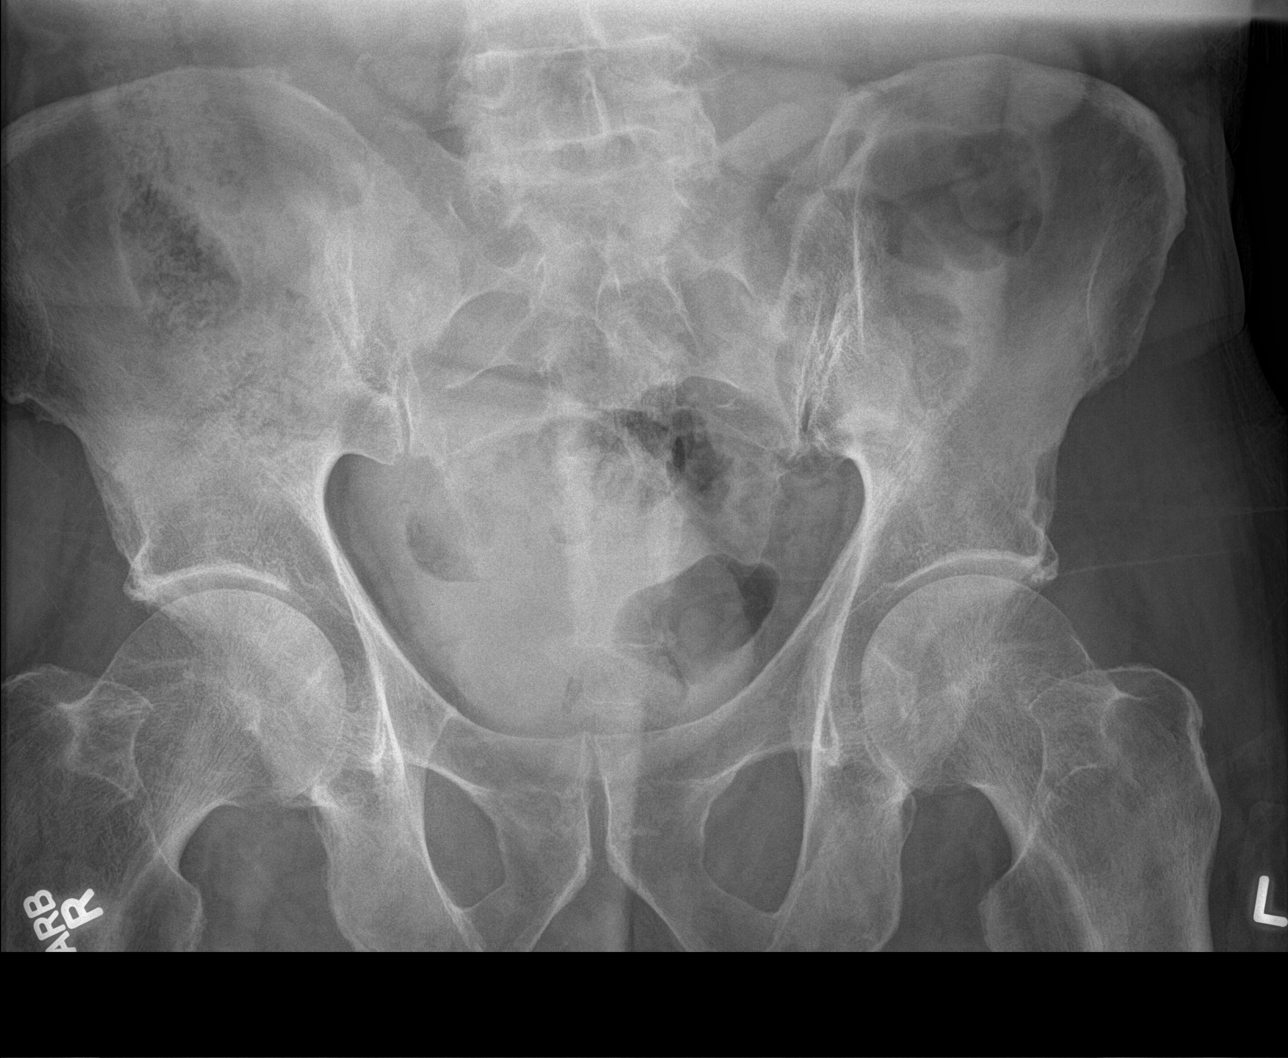

[4 of 4 positions shown; findings below may reference images not displayed]

FINDINGS: Chest:

Cardiomediastinal silhouette within normal limits. Tortuosity
descending thoracic aorta. Calcifications aortic arch.

No confluent airspace disease.  No pneumothorax or pleural effusion.

Cardiac pacing device on left chest wall with 2 leads in place.

Changes of emphysema again noted.

Abdomen:

Gastric tube terminates in the midline at the diaphragm, not beyond
the GE junction.

Multiple borderline dilated small bowel loops within the mid
abdomen. Gas and formed stool within the right colon. Small amount
of gas within the sigmoid colon and rectum. No unexpected
calcifications or radiopaque foreign body.

Surgical changes of cholecystectomy.

Degenerative changes of the spine.  No displaced fracture.
IMPRESSION: Chest:

No radiographic evidence of acute cardiopulmonary disease.

Abdomen:

Gastric tube terminates the distal esophagus, with the tip not
beyond the GE junction. This may be advanced slightly into the
stomach for better position.

Multiple borderline dilated small bowel loops indicating ileus or
potentially small bowel obstruction. Recommend serial plain film
until resolution of this abnormal bowel gas pattern given findings
on prior CT.

## 2016-10-07 ENCOUNTER — Encounter: Payer: Self-pay | Admitting: Cardiovascular Disease

## 2016-10-07 ENCOUNTER — Ambulatory Visit (INDEPENDENT_AMBULATORY_CARE_PROVIDER_SITE_OTHER): Payer: Medicare Other | Admitting: Cardiovascular Disease

## 2016-10-07 VITALS — BP 125/83 | HR 79 | Ht 76.0 in | Wt 179.8 lb

## 2016-10-07 DIAGNOSIS — I482 Chronic atrial fibrillation: Secondary | ICD-10-CM

## 2016-10-07 DIAGNOSIS — I4821 Permanent atrial fibrillation: Secondary | ICD-10-CM

## 2016-10-07 NOTE — Patient Instructions (Addendum)
Medication Instructions:   No medication changes made  Talk with Dr. Juel BurrowMasoud Ask about Vit B12, vit D testosterone  Labwork:  No new labs needed  Testing/Procedures:  No further testing at this time   Follow-Up: It was a pleasure seeing you in the office today. Please call us if you have new issues that need to be addressed before your next appt.  [phone]  Your physician wants you to follow-up in: 12 months.  You will receive a reminder letter in the mail two months in advance. If you don't receive a letter, please call our office to schedule the follow-up appointment.  If you need a refill on your cardiac medications before your next appointment, please call your pharmacy.

## 2016-10-07 NOTE — Progress Notes (Signed)
Cardiology Office Note  Date:  10/07/2016   ID:  Johnny Norman, DOB 07-25-1925, MRN [id number]  PCP:  Corky Downs, MD   Chief Complaint  Patient presents with  . other    12 month F/u c/o less stamina and short term memory. Meds reviewed verbally with pt.    HPI:  Johnny Norman is a very pleasant 83 year old gentleman with history of  sick sinus syndrome,  atrial fibrillation,  pacemaker placed by Dr. Juel Burrow in 2010   who presents for routine followup for atrial fibrillation.  In general he reports he has been feeling well Less stamina than before Some balance issues Walks around the block, gardening Dr. Juel Burrow did an echo in the office, results unavailable Was told he had a leaky valve Dr. Juel Burrow performance ischecks pacers  Previous lab work discussed with him Labs 04/2014: total chol  148, ldl 82 No recent lab work available  EKG personally reviewed by myself on todays visit shows paced rhythm 79 bpm, suspect underlying rhythm is atrial fibrillation  Other medical history Previous treadmill stress test which showed no EKG changes concerning for ischemia.  Recent echocardiogram also done by Dr. Harl Bowie showed no significant valvular pathology, good ejection fraction  Normal ABIs in the past Previously seen by Dr. Evette Cristal who felt his leg pain was orthopedic related.  Holter monitor in 2010 that showed periods of tachy arrhythmia. He was started on metoprolol at that time with improvement of his symptoms  PMH:   has a past medical history of A-fib [facility]); CHF (congestive heart failure) (HCC); Claudication [facility]); Hyperlipidemia; Small bowel obstruction (HCC); and Thyroid disease.  PSH:    Past Surgical History:  Procedure Laterality Date  . APPENDECTOMY    . BOWEL RESECTION  10/12/2014   Procedure: SMALL BOWEL RESECTION;  Surgeon: Gladis Riffle, MD;  Location: [facility] ORS;  Service: General;;  . CHOLECYSTECTOMY    . COLONOSCOPY    . INSERT / REPLACE / REMOVE  PACEMAKER  09/13/2008   SSS s/p pacemaker implant; Medtronic pacemaker,.  . LAPAROTOMY N/A 10/12/2014   Procedure: EXPLORATORY LAPAROTOMY;  Surgeon: Gladis Riffle, MD;  Location: [facility] ORS;  Service: General;  Laterality: N/A;  . PROSTATE SURGERY      Current Outpatient Prescriptions  Medication Sig Dispense Refill  . acetaminophen (TYLENOL) 500 MG tablet Take 1 tablet (500 mg total) by mouth every 6 (six) hours as needed for moderate pain. 30 tablet 0  . aspirin 81 MG tablet Take 81 mg by mouth daily.    Marland Kitchen atorvastatin (LIPITOR) 10 MG tablet Take 10 mg by mouth daily.    . cholecalciferol (VITAMIN D) 1000 UNITS tablet Take 1,000 Units by mouth daily.    . Flaxseed, Linseed, (FLAXSEED OIL) 1000 MG CAPS Take 1,000 mg by mouth daily.    Marland Kitchen levothyroxine (LEVOTHROID) 88 MCG tablet Take 88 mcg by mouth daily.    Marland Kitchen lisinopril (PRINIVIL,ZESTRIL) 2.5 MG tablet Take 2.5 mg by mouth daily.    . mirabegron ER (MYRBETRIQ) 50 MG TB24 tablet Take 50 mg by mouth 2 (two) times daily.    . Multiple Vitamin (MULTIVITAMIN) tablet Take 1 tablet by mouth daily.    . nadolol (CORGARD) 20 MG tablet Take 20 mg by mouth daily.     . Omega-3 Fatty Acids (FISH OIL) 1200 MG CAPS Take by mouth daily.    Marland Kitchen warfarin (COUMADIN) 4 MG tablet Take 4 mg by mouth daily.  7   No current facility-administered medications  for this visit.      Allergies:   Patient has no known allergies.   Social History:  The patient  reports that he has never smoked. He has never used smokeless tobacco. He reports that he does not drink alcohol or use drugs.   Family History:   Family history is unknown by patient.    Review of Systems: Review of Systems  Constitutional: Negative.   Eyes:       Visual problem  Respiratory: Negative.   Cardiovascular: Negative.   Gastrointestinal: Negative.   Musculoskeletal: Negative.   Neurological: Negative.   Psychiatric/Behavioral: Negative.   All other systems reviewed and are  negative.    PHYSICAL EXAM: VS:  BP 125/83 (BP Location: Left Arm, Patient Position: Sitting, Cuff Size: Normal)   Pulse 79   Ht 6\' 4"  (1.93 m)   Wt 179 lb 12 oz (81.5 kg)   BMI 21.88 kg/m  , BMI Body mass index is 21.88 kg/m. GEN: Well nourished, well developed, in no acute distress  HEENT: normal  Neck: no JVD, carotid bruits, or masses Cardiac: RRR; no murmurs, rubs, or gallops,no edema  Respiratory:  clear to auscultation bilaterally, normal work of breathing GI: soft, nontender, nondistended, + BS MS: no deformity or atrophy  Skin: warm and dry, no rash Neuro:  Strength and sensation are intact Psych: euthymic mood, full affect    Recent Labs: No results found for requested labs within last 8760 hours.    Lipid Panel Lab Results  Component Value Date   CHOL 159 10/12/2012   HDL 59 10/12/2012   LDLCALC 83 10/12/2012   TRIG 87 10/12/2012      Wt Readings from Last 3 Encounters:  10/07/16 179 lb 12 oz (81.5 kg)  10/08/15 174 lb 8 oz (79.2 kg)  11/22/14 171 lb 9.6 oz (77.8 kg)       ASSESSMENT AND PLAN:   Atrial fibrillation, unspecified type (HCC) - Plan: EKG 12-Lead Paced rhythm on EKG on his warfarin, also takes nadolol. Rate well controlled  Hyperlipidemia  No changes to the medications were made. Most recent lipid panel not available Previously at goal  Sick sinus syndrome (HCC) History of pacemaker, followed by Dr. Juel BurrowMasoud  Encounter for anticoagulation discussion and counseling Discussed his warfarin, denies any significant bleeding or complications  Stamina/lethargy Recommended he talk with Dr. Harl BowieMassoud, could check testosterone level, B-12 He takes vitamin D over-the-counter   Total encounter time more than 25 minutes  Greater than 50% was spent in counseling and coordination of care with the patient  Disposition:   F/U  12 months   Orders Placed This Encounter  Procedures  . EKG 12-Lead     Signed, Dossie Arbourim Dae Antonucci, M.D.,  Ph.D. 10/07/2016  [facility] [place] [phone]

## 2017-10-04 NOTE — Progress Notes (Signed)
Cardiology Office Note  Date:  10/05/2017   ID:  Johnny Norman, DOB August 31, 1925, MRN [id number]  PCP:  Johnny Downs, MD   Chief Complaint  Patient presents with  . other    12 month follow up. Meds reviewed by the pt. verbally. "doing well."     HPI:  Johnny Norman is a very pleasant 84 year old gentleman with history of  sick sinus syndrome,  atrial fibrillation,  pacemaker placed by Dr. Juel Burrow in 2010   who presents for routine followup for atrial fibrillation.  Left foot, flat foot Denies any significant shortness of breath or chest pain Some balance issues No regular exercise program Walks around the block, gardening  Dr. Juel Burrow did an echo in the office, results unavailable Was told he had a leaky valve  Dr. Juel Burrow performs  Pacer checks Told his pacer battery is getting low  Labs 04/2014: total chol  148, ldl 82  EKG personally reviewed by myself on todays visit shows paced rhythm 75 no bpm, suspect underlying rhythm is atrial fibrillation  Other medical history Previous treadmill stress test which showed no EKG changes concerning for ischemia.  Recent echocardiogram also done by Dr. Harl Bowie showed no significant valvular pathology, good ejection fraction  Normal ABIs in the past Previously seen by Dr. Evette Cristal who felt his leg pain was orthopedic related.  Holter monitor in 2010 that showed periods of tachy arrhythmia. He was started on metoprolol at that time with improvement of his symptoms  PMH:   has a past medical history of A-fib (HCC), CHF (congestive heart failure) (HCC), Claudication (HCC), Hyperlipidemia, Small bowel obstruction (HCC), and Thyroid disease.  PSH:    Past Surgical History:  Procedure Laterality Date  . APPENDECTOMY    . BOWEL RESECTION  10/12/2014   Procedure: SMALL BOWEL RESECTION;  Surgeon: Gladis Riffle, MD;  Location: [facility] ORS;  Service: General;;  . CHOLECYSTECTOMY    . COLONOSCOPY    . INSERT / REPLACE / REMOVE PACEMAKER   09/13/2008   SSS s/p pacemaker implant; Medtronic pacemaker,.  . LAPAROTOMY N/A 10/12/2014   Procedure: EXPLORATORY LAPAROTOMY;  Surgeon: Gladis Riffle, MD;  Location: [facility] ORS;  Service: General;  Laterality: N/A;  . PROSTATE SURGERY      Current Outpatient Medications  Medication Sig Dispense Refill  . acetaminophen (TYLENOL) 500 MG tablet Take 1 tablet (500 mg total) by mouth every 6 (six) hours as needed for moderate pain. 30 tablet 0  . aspirin 81 MG tablet Take 81 mg by mouth daily.    Marland Kitchen atorvastatin (LIPITOR) 10 MG tablet Take 10 mg by mouth daily.    . cholecalciferol (VITAMIN D) 1000 UNITS tablet Take 1,000 Units by mouth daily.    . Flaxseed, Linseed, (FLAXSEED OIL) 1000 MG CAPS Take 1,000 mg by mouth daily.    Marland Kitchen levothyroxine (LEVOTHROID) 88 MCG tablet Take 88 mcg by mouth daily.    Marland Kitchen lisinopril (PRINIVIL,ZESTRIL) 2.5 MG tablet Take 2.5 mg by mouth daily.    . mirabegron ER (MYRBETRIQ) 50 MG TB24 tablet Take 50 mg by mouth 2 (two) times daily.    . Multiple Vitamin (MULTIVITAMIN) tablet Take 1 tablet by mouth daily.    . nadolol (CORGARD) 20 MG tablet Take 20 mg by mouth daily.     Marland Kitchen warfarin (COUMADIN) 5 MG tablet Take 5 mg by mouth daily.     No current facility-administered medications for this visit.      Allergies:   Patient has no  known allergies.   Social History:  The patient  reports that he has never smoked. He has never used smokeless tobacco. He reports that he does not drink alcohol or use drugs.   Family History:   Family history is unknown by patient.    Review of Systems: Review of Systems  Constitutional: Negative.   Eyes:       Visual problem  Respiratory: Negative.   Cardiovascular: Negative.   Gastrointestinal: Negative.   Musculoskeletal: Negative.   Neurological: Negative.   Psychiatric/Behavioral: Negative.   All other systems reviewed and are negative.   PHYSICAL EXAM: VS:  BP 120/74 (BP Location: Left Arm, Patient Position:  Sitting, Cuff Size: Normal)   Pulse 75   Ht 6\' 4"  (1.93 m)   Wt 178 lb 4 oz (80.9 kg)   BMI 21.70 kg/m  , BMI Body mass index is 21.7 kg/m. Constitutional:  oriented to person, place, and time. No distress.  HENT:  Head: Normocephalic and atraumatic.  Eyes:  no discharge. No scleral icterus.  Neck: Normal range of motion. Neck supple. No JVD present.  Cardiovascular: Normal rate, regular rhythm, normal heart sounds and intact distal pulses. Exam reveals no gallop and no friction rub. No edema No murmur heard. Pulmonary/Chest: Effort normal and breath sounds normal. No stridor. No respiratory distress.  no wheezes.  no rales.  no tenderness.  Abdominal: Soft.  no distension.  no tenderness.  Musculoskeletal: Normal range of motion.  no  tenderness or deformity.  Neurological:  normal muscle tone. Coordination normal. No atrophy Skin: Skin is warm and dry. No rash noted. not diaphoretic.  Psychiatric:  normal mood and affect. behavior is normal. Thought content normal.   Recent Labs: No results found for requested labs within last 8760 hours.    Lipid Panel Lab Results  Component Value Date   CHOL 159 10/12/2012   HDL 59 10/12/2012   LDLCALC 83 10/12/2012   TRIG 87 10/12/2012      Wt Readings from Last 3 Encounters:  10/05/17 178 lb 4 oz (80.9 kg)  10/07/16 179 lb 12 oz (81.5 kg)  10/08/15 174 lb 8 oz (79.2 kg)     ASSESSMENT AND PLAN:   Atrial fibrillation, unspecified type (HCC) - Plan: EKG 12-Lead Paced rhythm on EKG on his warfarin, also takes nadolol. Rate well controlled We'll hold the aspirin, otherwise stable  Hyperlipidemia Most recent lipid panel not available followed by primary care, previously well-controlled  Sick sinus syndrome (HCC) History of pacemaker, followed by Dr. Juel Burrow Details unavailable  Encounter for anticoagulation discussion and counseling Discussed his warfarin, denies any significant bleeding or  complications stable  Stamina/lethargy  takes vitamin D over-the-counter ecommended regular walking program   Total encounter time more than 25 minutes  Greater than 50% was spent in counseling and coordination of care with the patient  Disposition:   F/U  12 months   Orders Placed This Encounter  Procedures  . EKG 12-Lead     Signed, Dossie Arbour, M.D., Ph.D. 10/05/2017  [facility] [place], [place] [phone]

## 2017-10-05 ENCOUNTER — Encounter: Payer: Self-pay | Admitting: Cardiovascular Disease

## 2017-10-05 ENCOUNTER — Ambulatory Visit: Payer: Medicare Other | Admitting: Cardiovascular Disease

## 2017-10-05 VITALS — BP 120/74 | HR 75 | Ht 76.0 in | Wt 178.2 lb

## 2017-10-05 DIAGNOSIS — I495 Sick sinus syndrome: Secondary | ICD-10-CM

## 2017-10-05 DIAGNOSIS — I482 Chronic atrial fibrillation: Secondary | ICD-10-CM | POA: Diagnosis not present

## 2017-10-05 DIAGNOSIS — R002 Palpitations: Secondary | ICD-10-CM | POA: Diagnosis not present

## 2017-10-05 DIAGNOSIS — E782 Mixed hyperlipidemia: Secondary | ICD-10-CM

## 2017-10-05 DIAGNOSIS — I4821 Permanent atrial fibrillation: Secondary | ICD-10-CM

## 2017-10-05 NOTE — Patient Instructions (Addendum)
Medication Instructions:   Stop the aspirin and flax  Labwork:  No new labs needed  Testing/Procedures:  No further testing at this time  Follow-Up: It was a pleasure seeing you in the office today. Please call us if you have new issues that need to be addressed before your next appt.  [phone]  Your physician wants you to follow-up in: 12 months.  You will receive a reminder letter in the mail two months in advance. If you don't receive a letter, please call our office to schedule the follow-up appointment.  If you need a refill on your cardiac medications before your next appointment, please call your pharmacy.  For educational health videos Log in to : [email or web address] Or : [email or web address], password : [email or web address]

## 2017-12-10 ENCOUNTER — Other Ambulatory Visit: Payer: Self-pay

## 2017-12-10 ENCOUNTER — Encounter: Payer: Self-pay | Admitting: Urology

## 2017-12-10 ENCOUNTER — Ambulatory Visit: Payer: Medicare Other | Admitting: Urology

## 2017-12-10 VITALS — BP 140/84 | HR 65 | Wt 180.0 lb

## 2017-12-10 DIAGNOSIS — N401 Enlarged prostate with lower urinary tract symptoms: Secondary | ICD-10-CM

## 2017-12-10 DIAGNOSIS — N138 Other obstructive and reflux uropathy: Secondary | ICD-10-CM

## 2017-12-10 LAB — MICROSCOPIC EXAMINATION: WBC, UA: NONE SEEN /hpf (ref 0–5)

## 2017-12-10 LAB — BLADDER SCAN AMB NON-IMAGING: Scan Result: 0

## 2017-12-10 LAB — URINALYSIS, COMPLETE
Bilirubin, UA: NEGATIVE
GLUCOSE, UA: NEGATIVE
KETONES UA: NEGATIVE
Leukocytes, UA: NEGATIVE
NITRITE UA: NEGATIVE
PH UA: 6 (ref 5.0–7.5)
Protein, UA: NEGATIVE
RBC, UA: NEGATIVE
Specific Gravity, UA: 1.01 (ref 1.005–1.030)
UUROB: 0.2 mg/dL (ref 0.2–1.0)

## 2017-12-10 MED ORDER — MIRABEGRON ER 25 MG PO TB24: 25.0000 mg | ORAL_TABLET | Freq: Two times a day (BID) | ORAL | 11 refills | Status: DC

## 2017-12-10 NOTE — Progress Notes (Signed)
12/10/2017 9:21 AM   Johnny Norman 03/03/1925 [id number]  Referring provider: Corky Downs, MD [address] [place], [place] [address]  CC: BPH/LUTS  HPI: I had the pleasure of seeing Johnny Norman in urology clinic in consultation for BPH/LUTS from Dr. Juel Burrow.  He is a 84 year old male with history of CHF and atrial fibrillation on Coumadin, as well as some type of bladder outlet procedure approximately 25 years ago.  It sounds like this was most likely a TURP.  He was previously followed by Dr. Achilles Dunk and managed with Myrbetriq 25 mg twice daily for overactive bladder symptoms.  His primary complaint is nocturia 1 time per night.  He is overall doing extremely well on this medication.  He has been on this medication long-term.  He denies any gross hematuria or episodes of urinary retention.  Prostate measures 60 cc on recent CT scan.  Last PSA was 0.9 in 2015.  His IPSS score in clinic today is 9, with quality of life mostly satisfied.  PVR in clinic today is 0.  There are no aggravating or alleviating factors.  Duration is greater than 5 years.  Severity is mild   PMH: Past Medical History:  Diagnosis Date  . A-fib (HCC)   . CHF (congestive heart failure) (HCC)   . Claudication (HCC)    leg  . Hyperlipidemia   . Small bowel obstruction (HCC)   . Thyroid disease     Surgical History: Past Surgical History:  Procedure Laterality Date  . APPENDECTOMY    . BOWEL RESECTION  10/12/2014   Procedure: SMALL BOWEL RESECTION;  Surgeon: Gladis Riffle, MD;  Location: [facility] ORS;  Service: General;;  . CHOLECYSTECTOMY    . COLONOSCOPY    . INSERT / REPLACE / REMOVE PACEMAKER  09/13/2008   SSS s/p pacemaker implant; Medtronic pacemaker,.  . LAPAROTOMY N/A 10/12/2014   Procedure: EXPLORATORY LAPAROTOMY;  Surgeon: Gladis Riffle, MD;  Location: [facility] ORS;  Service: General;  Laterality: N/A;  . PROSTATE SURGERY      Allergies: No Known Allergies  Family History: Family History    Family history unknown: Yes    Social History:  reports that he has never smoked. He has never used smokeless tobacco. He reports that he does not drink alcohol or use drugs.  ROS: Please see flowsheet from today's date for complete review of systems.  Physical Exam: BP 140/84   Pulse 65   Wt 180 lb (81.6 kg)   BMI 21.91 kg/m    Constitutional:  Alert and oriented, No acute distress. Cardiovascular: No clubbing, cyanosis, or edema. Respiratory: Normal respiratory effort, no increased work of breathing. GI: Abdomen is soft, nontender, nondistended, no abdominal masses GU: No CVA tenderness Lymph: No cervical or inguinal lymphadenopathy. Skin: No rashes, bruises or suspicious lesions. Neurologic: Grossly intact, no focal deficits, moving all 4 extremities. Psychiatric: Normal mood and affect.  Laboratory Data: PSA 0.9 in 2015  Urinalysis today 0 WBCs, 0-2 RBCs, few bacteria, nitrite negative  Pertinent Imaging: I have personally reviewed the CT abdomen pelvis with contrast from September 2016: Prostate measured 60 cc.  No hydronephrosis.  Assessment & Plan:   In summary, Johnny Norman is a 84 year old male with history of congestive heart failure and atrial fibrillation on Coumadin who has very well managed urinary symptoms on Myrbetriq.  He has been on this medication long-term, and he does not remember what his symptoms were like before he started this.  I did recommend considering stopping  the Myrbetriq for 1 month to see if his symptoms worsen.  He can always restart the medication if he has bothersome urinary symptoms at that time, however I am in favor of minimizing unnecessary medications with his age.  Return in about 1 year (around 12/11/2018) for PVR.  Sondra ComeBrian C Travontae Freiberger, MD  [facility] [address], [address] [place], [place] [address] [phone]

## 2017-12-23 ENCOUNTER — Ambulatory Visit
Admission: RE | Admit: 2017-12-23 | Discharge: 2017-12-23 | Disposition: A | Discharge status: Home / Self Care | Payer: Medicare Other | Source: Ambulatory Visit | Attending: Internal Medicine | Admitting: Internal Medicine

## 2017-12-23 ENCOUNTER — Other Ambulatory Visit: Payer: Self-pay

## 2017-12-23 ENCOUNTER — Encounter
Admission: RE | Admit: 2017-12-23 | Discharge: 2017-12-23 | Disposition: A | Discharge status: Home / Self Care | Payer: Medicare Other | Source: Ambulatory Visit | Attending: Cardiology | Admitting: Cardiology

## 2017-12-23 DIAGNOSIS — Z01818 Encounter for other preprocedural examination: Secondary | ICD-10-CM | POA: Insufficient documentation

## 2017-12-23 HISTORY — DX: Presence of cardiac pacemaker: Z95.0

## 2017-12-23 HISTORY — DX: Peripheral vascular disease, unspecified: I73.9

## 2017-12-23 LAB — BASIC METABOLIC PANEL
Anion gap: 6 (ref 5–15)
BUN: 17 mg/dL (ref 8–23)
CALCIUM: 8.9 mg/dL (ref 8.9–10.3)
CHLORIDE: 107 mmol/L (ref 98–111)
CO2: 28 mmol/L (ref 22–32)
CREATININE: 1.25 mg/dL — AB (ref 0.61–1.24)
GFR calc Af Amer: 58 mL/min — ABNORMAL LOW (ref >60)
GFR calc non Af Amer: 50 mL/min — ABNORMAL LOW (ref >60)
Glucose, Bld: 96 mg/dL (ref 70–99)
Potassium: 4.8 mmol/L (ref 3.5–5.1)
Sodium: 141 mmol/L (ref 135–145)

## 2017-12-23 LAB — CBC
HCT: 47.2 % (ref 39.0–52.0)
HEMOGLOBIN: 15.2 g/dL (ref 13.0–17.0)
MCH: 30.3 pg (ref 26.0–34.0)
MCHC: 32.2 g/dL (ref 30.0–36.0)
MCV: 94.2 fL (ref 80.0–100.0)
Platelets: 200 10*3/uL (ref 150–400)
RBC: 5.01 MIL/uL (ref 4.22–5.81)
RDW: 13.2 % (ref 11.5–15.5)
WBC: 8.5 10*3/uL (ref 4.0–10.5)
nRBC: 0 % (ref 0.0–0.2)

## 2017-12-23 LAB — PROTIME-INR
INR: 1.45
PROTHROMBIN TIME: 17.5 s — AB (ref 11.4–15.2)

## 2017-12-23 LAB — SURGICAL PCR SCREEN
MRSA, PCR: NEGATIVE
Staphylococcus aureus: POSITIVE — AB

## 2017-12-23 NOTE — Patient Instructions (Signed)
Your procedure is scheduled on: Wednesday 12/30/17 Report to DAY SURGERY DEPARTMENT LOCATED ON 2ND FLOOR MEDICAL MALL ENTRANCE. To find out your arrival time please call [phone] between 1PM - 3PM on Tuesday 12/29/17.  Remember: Instructions that are not followed completely may result in serious medical risk, up to and including death, or upon the discretion of your surgeon and anesthesiologist your surgery may need to be rescheduled.     _X__ 1. Do not eat food after midnight the night before your procedure.                 No gum chewing or hard candies. You may drink clear liquids up to 2 hours                 before you are scheduled to arrive for your surgery- DO not drink clear                 liquids within 2 hours of the start of your surgery.                 Clear Liquids include:  water, apple juice without pulp, clear carbohydrate                 drink such as Clearfast or Gatorade, Black Coffee or Tea (Do not add                 anything to coffee or tea).  __X__2.  On the morning of surgery brush your teeth with toothpaste and water, you                 may rinse your mouth with mouthwash if you wish.  Do not swallow any              toothpaste of mouthwash.     _X__ 3.  No Alcohol for 24 hours before or after surgery.   _X__ 4.  Do Not Smoke or use e-cigarettes For 24 Hours Prior to Your Surgery.                 Do not use any chewable tobacco products for at least 6 hours prior to                 surgery.  ____  5.  Bring all medications with you on the day of surgery if instructed.   __X__  6.  Notify your doctor if there is any change in your medical condition      (cold, fever, infections).     Do not wear jewelry, make-up, hairpins, clips or nail polish. Do not wear lotions, powders, or perfumes.  Do not shave 48 hours prior to surgery. Men may shave face and neck. Do not bring valuables to the hospital.    [facility] is not responsible for any belongings or  valuables.  Contacts, dentures/partials or body piercings may not be worn into surgery. Bring a case for your contacts, glasses or hearing aids, a denture cup will be supplied. Leave your suitcase in the car. After surgery it may be brought to your room. For patients admitted to the hospital, discharge time is determined by your treatment team.   Patients discharged the day of surgery will not be allowed to drive home.   Please read over the following fact sheets that you were given:   MRSA Information  __X__ Take these medicines the morning of surgery with A SIP OF WATER:  1. atorvastatin (LIPITOR)  2. levothyroxine (LEVOTHROID)  3. nadolol (CORGARD)  4.  5.  6.  ____ Fleet Enema (as directed)   __X__ Use CHG Soap/SAGE wipes as directed  ____ Use inhalers on the day of surgery  ____ Stop metformin/Janumet/Farxiga 2 days prior to surgery    ____ Take 1/2 of usual insulin dose the night before surgery. No insulin the morning          of surgery.   ____ Stop Blood Thinners Coumadin or Warfarin today  __X__ Stop Anti-inflammatories 7 days before surgery such as Advil, Ibuprofen, Motrin,  BC or Goodies Powder, Naprosyn, Naproxen, Aleve, Aspirin you can take tylenol if needed   __X__ Stop all herbal supplements, fish oil or vitamin E until after surgery.    ____ Bring C-Pap to the hospital.

## 2017-12-28 NOTE — Pre-Procedure Instructions (Signed)
Labs faxed to dr Juel Burrowmasoud

## 2017-12-30 ENCOUNTER — Ambulatory Visit: Payer: Medicare Other

## 2017-12-30 ENCOUNTER — Other Ambulatory Visit: Payer: Self-pay

## 2017-12-30 ENCOUNTER — Ambulatory Visit
Admission: RE | Admit: 2017-12-30 | Discharge: 2017-12-30 | Disposition: A | Discharge status: Home / Self Care | Payer: Medicare Other | Source: Ambulatory Visit | Attending: Cardiology | Admitting: Cardiology

## 2017-12-30 ENCOUNTER — Encounter
Admission: RE | Disposition: A | Discharge status: Home / Self Care | Payer: Self-pay | Source: Ambulatory Visit | Attending: Cardiology

## 2017-12-30 ENCOUNTER — Encounter: Payer: Self-pay | Admitting: *Deleted

## 2017-12-30 DIAGNOSIS — N182 Chronic kidney disease, stage 2 (mild): Secondary | ICD-10-CM | POA: Diagnosis not present

## 2017-12-30 DIAGNOSIS — I509 Heart failure, unspecified: Secondary | ICD-10-CM | POA: Diagnosis not present

## 2017-12-30 DIAGNOSIS — Z45018 Encounter for adjustment and management of other part of cardiac pacemaker: Secondary | ICD-10-CM | POA: Diagnosis not present

## 2017-12-30 DIAGNOSIS — Z9049 Acquired absence of other specified parts of digestive tract: Secondary | ICD-10-CM | POA: Insufficient documentation

## 2017-12-30 DIAGNOSIS — E7800 Pure hypercholesterolemia, unspecified: Secondary | ICD-10-CM | POA: Diagnosis not present

## 2017-12-30 DIAGNOSIS — I495 Sick sinus syndrome: Secondary | ICD-10-CM | POA: Diagnosis present

## 2017-12-30 DIAGNOSIS — E785 Hyperlipidemia, unspecified: Secondary | ICD-10-CM | POA: Diagnosis not present

## 2017-12-30 DIAGNOSIS — I130 Hypertensive heart and chronic kidney disease with heart failure and stage 1 through stage 4 chronic kidney disease, or unspecified chronic kidney disease: Secondary | ICD-10-CM | POA: Diagnosis not present

## 2017-12-30 DIAGNOSIS — I482 Chronic atrial fibrillation, unspecified: Secondary | ICD-10-CM | POA: Insufficient documentation

## 2017-12-30 DIAGNOSIS — I739 Peripheral vascular disease, unspecified: Secondary | ICD-10-CM | POA: Insufficient documentation

## 2017-12-30 DIAGNOSIS — Z7901 Long term (current) use of anticoagulants: Secondary | ICD-10-CM | POA: Insufficient documentation

## 2017-12-30 DIAGNOSIS — Z809 Family history of malignant neoplasm, unspecified: Secondary | ICD-10-CM | POA: Diagnosis not present

## 2017-12-30 DIAGNOSIS — Z79899 Other long term (current) drug therapy: Secondary | ICD-10-CM | POA: Insufficient documentation

## 2017-12-30 HISTORY — PX: PACEMAKER INSERTION: SHX728

## 2017-12-30 SURGERY — INSERTION, CARDIAC PACEMAKER
Anesthesia: General

## 2017-12-30 MED ORDER — SODIUM CHLORIDE 0.9 % IV SOLN: 250.0000 mL | INTRAVENOUS | Status: DC | PRN

## 2017-12-30 MED ORDER — FAMOTIDINE 20 MG PO TABS
20.0000 mg | ORAL_TABLET | Freq: Once | ORAL | Status: AC
  Administered 2017-12-30: 20 mg via ORAL

## 2017-12-30 MED ORDER — CEFAZOLIN SODIUM-DEXTROSE 1-4 GM/50ML-% IV SOLN
INTRAVENOUS | Status: AC
  Filled 2017-12-30: qty 50

## 2017-12-30 MED ORDER — LACTATED RINGERS IV SOLN
INTRAVENOUS | Status: DC
  Administered 2017-12-30: 13:00:00 via INTRAVENOUS

## 2017-12-30 MED ORDER — ONDANSETRON HCL 4 MG/2ML IJ SOLN
INTRAMUSCULAR | Status: DC | PRN
  Administered 2017-12-30: 4 mg via INTRAVENOUS

## 2017-12-30 MED ORDER — PROPOFOL 10 MG/ML IV BOLUS
INTRAVENOUS | Status: AC
  Filled 2017-12-30: qty 20

## 2017-12-30 MED ORDER — ACETAMINOPHEN 650 MG RE SUPP
650.0000 mg | RECTAL | Status: DC | PRN
  Filled 2017-12-30: qty 1

## 2017-12-30 MED ORDER — ONDANSETRON HCL 4 MG/2ML IJ SOLN: 4.0000 mg | Freq: Once | INTRAMUSCULAR | Status: DC | PRN

## 2017-12-30 MED ORDER — FAMOTIDINE 20 MG PO TABS
ORAL_TABLET | ORAL | Status: AC
  Administered 2017-12-30: 20 mg via ORAL
  Filled 2017-12-30: qty 1

## 2017-12-30 MED ORDER — SODIUM CHLORIDE 0.9 % IR SOLN
Status: DC | PRN
  Administered 2017-12-30: 50 mL

## 2017-12-30 MED ORDER — LIDOCAINE HCL (CARDIAC) PF 100 MG/5ML IV SOSY
PREFILLED_SYRINGE | INTRAVENOUS | Status: DC | PRN
  Administered 2017-12-30: 60 mg via INTRAVENOUS

## 2017-12-30 MED ORDER — SODIUM CHLORIDE 0.9% FLUSH: 3.0000 mL | INTRAVENOUS | Status: DC | PRN

## 2017-12-30 MED ORDER — FENTANYL CITRATE (PF) 100 MCG/2ML IJ SOLN
INTRAMUSCULAR | Status: AC
  Filled 2017-12-30: qty 2

## 2017-12-30 MED ORDER — PROPOFOL 500 MG/50ML IV EMUL
INTRAVENOUS | Status: DC | PRN
  Administered 2017-12-30: 20 ug/kg/min via INTRAVENOUS

## 2017-12-30 MED ORDER — FENTANYL CITRATE (PF) 100 MCG/2ML IJ SOLN: 25.0000 ug | INTRAMUSCULAR | Status: DC | PRN

## 2017-12-30 MED ORDER — ONDANSETRON HCL 4 MG/2ML IJ SOLN
INTRAMUSCULAR | Status: AC
  Filled 2017-12-30: qty 2

## 2017-12-30 MED ORDER — GENTAMICIN SULFATE 40 MG/ML IJ SOLN
INTRAMUSCULAR | Status: AC
  Filled 2017-12-30: qty 2

## 2017-12-30 MED ORDER — CEFAZOLIN SODIUM-DEXTROSE 1-4 GM/50ML-% IV SOLN
1.0000 g | Freq: Once | INTRAVENOUS | Status: AC
  Administered 2017-12-30: 2 g via INTRAVENOUS

## 2017-12-30 MED ORDER — ACETAMINOPHEN 325 MG PO TABS: 650.0000 mg | ORAL_TABLET | ORAL | Status: DC | PRN

## 2017-12-30 MED ORDER — LIDOCAINE HCL (PF) 1.5 % IJ SOLN
INTRAMUSCULAR | Status: DC | PRN
  Administered 2017-12-30: 8 mL

## 2017-12-30 MED ORDER — PROPOFOL 10 MG/ML IV BOLUS
INTRAVENOUS | Status: DC | PRN
  Administered 2017-12-30 (×3): 10 mg via INTRAVENOUS

## 2017-12-30 MED ORDER — FENTANYL CITRATE (PF) 100 MCG/2ML IJ SOLN
INTRAMUSCULAR | Status: DC | PRN
  Administered 2017-12-30: 12.5 ug via INTRAVENOUS

## 2017-12-30 MED ORDER — ACETAMINOPHEN 500 MG PO TABS: 1000.0000 mg | ORAL_TABLET | Freq: Four times a day (QID) | ORAL | Status: DC

## 2017-12-30 MED ORDER — LIDOCAINE HCL (PF) 2 % IJ SOLN
INTRAMUSCULAR | Status: AC
  Filled 2017-12-30: qty 10

## 2017-12-30 MED ORDER — SODIUM CHLORIDE 0.9% FLUSH: 3.0000 mL | Freq: Two times a day (BID) | INTRAVENOUS | Status: DC

## 2017-12-30 MED ORDER — SODIUM CHLORIDE 0.9 % IV SOLN: INTRAVENOUS | Status: DC

## 2017-12-30 SURGICAL SUPPLY — 36 items
BLADE SURG 15 STRL LF DISP TIS (BLADE) ×1 IMPLANT
BLADE SURG 15 STRL SS (BLADE) ×1
CABLE SURG 12 DISP A/V CHANNEL (MISCELLANEOUS) ×1 IMPLANT
CANISTER SUCT 1200ML W/VALVE (MISCELLANEOUS) ×2 IMPLANT
CHLORAPREP W/TINT 26ML (MISCELLANEOUS) ×2 IMPLANT
COVER LIGHT HANDLE STERIS (MISCELLANEOUS) ×4 IMPLANT
COVER MAYO STAND STRL (DRAPES) ×2 IMPLANT
DRAPE C-ARM XRAY 36X54 (DRAPES) ×1 IMPLANT
DRSG TEGADERM 4X4.75 (GAUZE/BANDAGES/DRESSINGS) ×2 IMPLANT
DRSG TELFA 4X3 1S NADH ST (GAUZE/BANDAGES/DRESSINGS) ×2 IMPLANT
ELECT REM PT RETURN 9FT ADLT (ELECTROSURGICAL) ×2
ELECTRODE REM PT RTRN 9FT ADLT (ELECTROSURGICAL) ×1 IMPLANT
GLOVE BIO SURGEON STRL SZ7 (GLOVE) ×6 IMPLANT
GOWN STRL REUS W/ TWL LRG LVL3 (GOWN DISPOSABLE) ×2 IMPLANT
GOWN STRL REUS W/TWL LRG LVL3 (GOWN DISPOSABLE) ×3
IMMOBILIZER SHDR LG LX 900803 (SOFTGOODS) ×2 IMPLANT
INTRO PACEMAKR LEAD 7FR 23CM (INTRODUCER)
INTRO PACEMAKR LEAD 9FR 23CM (INTRODUCER)
INTRODUCER PACEMKR LD 7FR 23CM (INTRODUCER) ×1 IMPLANT
INTRODUCER PACEMKR LD 9FR 23CM (INTRODUCER) ×1 IMPLANT
IPG PACE AZUR XT DR MRI W1DR01 (Pacemaker) IMPLANT
KIT TURNOVER KIT A (KITS) ×2 IMPLANT
KIT WRENCH (KITS) ×1 IMPLANT
LABEL OR SOLS (LABEL) ×2 IMPLANT
NDL HYPO 25X1 1.5 SAFETY (NEEDLE) ×1 IMPLANT
NEEDLE HYPO 25X1 1.5 SAFETY (NEEDLE) ×2 IMPLANT
NS IRRIG 500ML POUR BTL (IV SOLUTION) ×2 IMPLANT
PACE AZURE XT DR MRI W1DR01 (Pacemaker) ×2 IMPLANT
PACK PACE INSERTION (MISCELLANEOUS) ×2 IMPLANT
SUCTION FRAZIER HANDLE 10FR (MISCELLANEOUS) ×1
SUCTION TUBE FRAZIER 10FR DISP (MISCELLANEOUS) ×1 IMPLANT
SUT SILK 2 0 SH (SUTURE) ×4 IMPLANT
SUT SILK 3 0 (SUTURE) ×1
SUT SILK 3-0 (SUTURE) ×2 IMPLANT
SUT SILK 3-0 18XBRD TIE 12 (SUTURE) ×1 IMPLANT
SUT SILK 4 0 SH (SUTURE) ×2 IMPLANT

## 2017-12-30 NOTE — Transfer of Care (Signed)
Immediate Anesthesia Transfer of Care Note  Patient: Johnny Norman  Procedure(s) Performed: GENERATOR CHANGE OUT- DUAL CHAMBER (N/A )  Patient Location: PACU  Anesthesia Type:MAC  Level of Consciousness: awake, alert  and oriented  Airway & Oxygen Therapy: Patient Spontanous Breathing  Post-op Assessment: Report given to RN and Post -op Vital signs reviewed and stable  Post vital signs: Reviewed and stable  Last Vitals:  Vitals Value Taken Time  BP    Temp    Pulse 70 12/30/2017  2:06 PM  Resp 14 12/30/2017  2:06 PM  SpO2 100 % 12/30/2017  2:06 PM  Vitals shown include unvalidated device data.  Last Pain:  Vitals:   12/30/17 1232  TempSrc: Tympanic  PainSc: 0-No pain         Complications: No apparent anesthesia complications

## 2017-12-30 NOTE — Op Note (Signed)
Preoperative diagnosis End of battery life/ SSS, pacer dependece Postoperative diagnosis same  Procedure: Generator replacement    Following informed consent the patient was brought to the operating room and placed in the supine position.  After routine prep and drape lidocaine was infiltrated in the region of the previous incision and carried down to later the device pocket using sharp dissection . The pocket was opened the device was freed up and was explanted.  Interrogation of the previously implanted ventricular lead [id number] demonstrated an R wave of 11.2 millivolts., and impedance of 724ohms, and a pacing threshold of 0.5  volts at 0.4  The previously implanted atrial lead [id number] demonstrated a P-wave amplitude of 2.1646milllivolts.  The leads were inspected. The leads were then attached to a *[id number] generator, serial number [id number].    The pocket was irrigated with antibiotic containing saline solution hemostasis was assured and the leads and the device were placed in the pocket. The wound was then closed in  2 layers  inormal fashion.  Dermabond was applied.4  EBL minimal 5 cc  The patient tolerated the procedure without apparent complication.  Patient was sent to recovery room in stable condition.  Family was notified about patient condition.  [facility]	[place]

## 2017-12-30 NOTE — Anesthesia Post-op Follow-up Note (Signed)
Anesthesia QCDR form completed.        

## 2017-12-30 NOTE — Anesthesia Preprocedure Evaluation (Signed)
Anesthesia Evaluation  Patient identified by MRN, date of birth, ID band Patient awake    Reviewed: Allergy & Precautions, H&P , NPO status , Patient's Chart, lab work & pertinent test results, reviewed documented beta blocker date and time   History of Anesthesia Complications Negative for: history of anesthetic complications  Airway Mallampati: III  TM Distance: >3 FB Neck ROM: full    Dental no notable dental hx. (+) Poor Dentition   Pulmonary neg pulmonary ROS,           Cardiovascular Exercise Tolerance: Good (-) hypertension(-) angina+ Peripheral Vascular Disease and +CHF  (-) CAD, (-) Past MI, (-) Cardiac Stents and (-) CABG + dysrhythmias Atrial Fibrillation + pacemaker + Valvular Problems/Murmurs      Neuro/Psych negative neurological ROS  negative psych ROS   GI/Hepatic negative GI ROS, Neg liver ROS,   Endo/Other  neg diabetesHypothyroidism   Renal/GU negative Renal ROS  negative genitourinary   Musculoskeletal   Abdominal   Peds  Hematology negative hematology ROS (+)   Anesthesia Other Findings Past Medical History:   Claudication                                                   Comment:leg   A-fib                                                        Thyroid disease                                              Hyperlipidemia                                               CHF (congestive heart failure)                               Reproductive/Obstetrics negative OB ROS                             Anesthesia Physical  Anesthesia Plan  ASA: III  Anesthesia Plan: General   Post-op Pain Management:    Induction: Intravenous  PONV Risk Score and Plan: 2 and Propofol infusion and TIVA  Airway Management Planned: Natural Airway and Simple Face Mask  Additional Equipment:   Intra-op Plan:   Post-operative Plan:   Informed Consent: I have reviewed the patients  History and Physical, chart, labs and discussed the procedure including the risks, benefits and alternatives for the proposed anesthesia with the patient or authorized representative who has indicated his/her understanding and acceptance.   Dental Advisory Given  Plan Discussed with: Anesthesiologist, CRNA and Surgeon  Anesthesia Plan Comments:         Anesthesia Quick Evaluation

## 2017-12-30 NOTE — H&P (Signed)
H and P reviewed, no changes

## 2017-12-31 ENCOUNTER — Encounter: Payer: Self-pay | Admitting: Cardiology

## 2017-12-31 NOTE — Anesthesia Postprocedure Evaluation (Signed)
Anesthesia Post Note  Patient: Adah SalvageRichard E Rettke  Procedure(s) Performed: GENERATOR CHANGE OUT- DUAL CHAMBER (N/A )  Patient location during evaluation: PACU Anesthesia Type: General Level of consciousness: awake and alert Pain management: pain level controlled Vital Signs Assessment: post-procedure vital signs reviewed and stable Respiratory status: spontaneous breathing, nonlabored ventilation, respiratory function stable and patient connected to nasal cannula oxygen Cardiovascular status: blood pressure returned to baseline and stable Postop Assessment: no apparent nausea or vomiting Anesthetic complications: no     Last Vitals:  Vitals:   12/30/17 1500 12/30/17 1525  BP: (!) 158/97 (!) 159/97  Pulse: 72 70  Resp:    Temp: (!) 36.2 C   SpO2: 100% 96%    Last Pain:  Vitals:   12/30/17 1525  TempSrc:   PainSc: 0-No pain                 Lenard SimmerAndrew Lajoyce Tamura

## 2018-12-13 ENCOUNTER — Other Ambulatory Visit: Payer: Self-pay

## 2018-12-13 ENCOUNTER — Ambulatory Visit: Payer: Medicare Other | Admitting: Urology

## 2018-12-13 ENCOUNTER — Encounter: Payer: Self-pay | Admitting: Urology

## 2018-12-13 VITALS — BP 138/91 | HR 94 | Ht 75.0 in | Wt 180.0 lb

## 2018-12-13 DIAGNOSIS — N401 Enlarged prostate with lower urinary tract symptoms: Secondary | ICD-10-CM

## 2018-12-13 DIAGNOSIS — N138 Other obstructive and reflux uropathy: Secondary | ICD-10-CM | POA: Diagnosis not present

## 2018-12-13 LAB — BLADDER SCAN AMB NON-IMAGING

## 2018-12-13 NOTE — Progress Notes (Signed)
   12/13/2018 11:26 AM   Johnny Norman 01-18-1926 [id number]  Reason for visit: Follow up BPH and urinary symptoms  HPI: I saw [name] back in urology clinic for follow-up of BPH.  He is a 85 year old male with CHF and atrial fibrillation on Coumadin, and history of TURP over 20 years ago.  He had been previously followed by Dr. Jacqlyn Larsen and was on Myrbetriq 25 mg twice daily for OAB symptoms.  At our visit last year, we stopped the Myrbetriq, and he noticed no worsening of his urinary symptoms.  He is not on any urology medications at this time.  He denies any complaints today and is overall doing well.  He reports he is emptying his bladder well with a good stream and denies any urgency, frequency, leakage, gross hematuria, or UTIs.  PVR in clinic today is 0 mL.  Follow-up as needed  A total of 15 minutes were spent face-to-face with the patient, greater than 50% was spent in patient education, counseling, and coordination of care regarding BPH and urinary symptoms.  Billey Co, [facility] [address] [place], [place] [address] [phone]

## 2019-03-23 DIAGNOSIS — I959 Hypotension, unspecified: Secondary | ICD-10-CM | POA: Diagnosis not present

## 2019-03-23 DIAGNOSIS — I359 Nonrheumatic aortic valve disorder, unspecified: Secondary | ICD-10-CM | POA: Diagnosis not present

## 2019-03-23 DIAGNOSIS — I4891 Unspecified atrial fibrillation: Secondary | ICD-10-CM | POA: Diagnosis not present

## 2019-03-23 DIAGNOSIS — N182 Chronic kidney disease, stage 2 (mild): Secondary | ICD-10-CM | POA: Diagnosis not present

## 2019-04-19 DIAGNOSIS — H903 Sensorineural hearing loss, bilateral: Secondary | ICD-10-CM | POA: Diagnosis not present

## 2019-04-20 DIAGNOSIS — N182 Chronic kidney disease, stage 2 (mild): Secondary | ICD-10-CM | POA: Diagnosis not present

## 2019-04-20 DIAGNOSIS — I4891 Unspecified atrial fibrillation: Secondary | ICD-10-CM | POA: Diagnosis not present

## 2019-04-20 DIAGNOSIS — I359 Nonrheumatic aortic valve disorder, unspecified: Secondary | ICD-10-CM | POA: Diagnosis not present

## 2019-04-20 DIAGNOSIS — I959 Hypotension, unspecified: Secondary | ICD-10-CM | POA: Diagnosis not present

## 2019-04-21 ENCOUNTER — Ambulatory Visit: Payer: Medicare PPO | Attending: Internal Medicine

## 2019-05-04 DIAGNOSIS — H903 Sensorineural hearing loss, bilateral: Secondary | ICD-10-CM | POA: Diagnosis not present

## 2019-05-17 DIAGNOSIS — Z950 Presence of cardiac pacemaker: Secondary | ICD-10-CM | POA: Diagnosis not present

## 2019-05-17 DIAGNOSIS — I959 Hypotension, unspecified: Secondary | ICD-10-CM | POA: Diagnosis not present

## 2019-05-17 DIAGNOSIS — I359 Nonrheumatic aortic valve disorder, unspecified: Secondary | ICD-10-CM | POA: Diagnosis not present

## 2019-05-17 DIAGNOSIS — I4891 Unspecified atrial fibrillation: Secondary | ICD-10-CM | POA: Diagnosis not present

## 2019-05-17 DIAGNOSIS — N182 Chronic kidney disease, stage 2 (mild): Secondary | ICD-10-CM | POA: Diagnosis not present

## 2019-06-02 ENCOUNTER — Other Ambulatory Visit: Payer: Self-pay

## 2019-06-02 ENCOUNTER — Ambulatory Visit (INDEPENDENT_AMBULATORY_CARE_PROVIDER_SITE_OTHER): Payer: Medicare PPO | Admitting: Internal Medicine

## 2019-06-02 VITALS — BP 137/94 | HR 84

## 2019-06-02 DIAGNOSIS — Z950 Presence of cardiac pacemaker: Secondary | ICD-10-CM | POA: Diagnosis not present

## 2019-06-02 DIAGNOSIS — N529 Male erectile dysfunction, unspecified: Secondary | ICD-10-CM | POA: Diagnosis not present

## 2019-06-02 DIAGNOSIS — E039 Hypothyroidism, unspecified: Secondary | ICD-10-CM

## 2019-06-02 DIAGNOSIS — E782 Mixed hyperlipidemia: Secondary | ICD-10-CM

## 2019-06-03 DIAGNOSIS — E039 Hypothyroidism, unspecified: Secondary | ICD-10-CM | POA: Insufficient documentation

## 2019-06-03 NOTE — Progress Notes (Signed)
Established Patient Office Visit  Subjective:  Patient ID: Johnny Norman, male    DOB: 15-Feb-1925  Age: 86 y.o. MRN: [id number]  CC:  Chief Complaint  Patient presents with  . Pacemaker Check    HPI  [name] presents for pacer check.  Patient denies any history of dizziness no history of hypertension he denies any history of chest pain nausea vomiting or shortness of breath there is no history of swelling of the legs mentally he is alert and able to function  Past Medical History:  Diagnosis Date  . A-fib (HCC)   . CHF (congestive heart failure) (HCC)   . Claudication (HCC)    leg  . Hyperlipidemia   . Peripheral vascular disease (HCC)   . Presence of permanent cardiac pacemaker   . Small bowel obstruction (HCC)   . Thyroid disease     Past Surgical History:  Procedure Laterality Date  . APPENDECTOMY    . BOWEL RESECTION  10/12/2014   Procedure: SMALL BOWEL RESECTION;  Surgeon: Gladis Riffle, MD;  Location: [facility];  Service: General;;  . CHOLECYSTECTOMY    . COLONOSCOPY    . INSERT / REPLACE / REMOVE PACEMAKER  09/13/2008   SSS s/p pacemaker implant; Medtronic pacemaker,.  . LAPAROTOMY N/A 10/12/2014   Procedure: EXPLORATORY LAPAROTOMY;  Surgeon: Gladis Riffle, MD;  Location: [facility];  Service: General;  Laterality: N/A;  . PACEMAKER INSERTION N/A 12/30/2017   Procedure: GENERATOR CHANGE OUT- DUAL CHAMBER;  Surgeon: Corky Downs, MD;  Location: [facility];  Service: Cardiovascular;  Laterality: N/A;  . PROSTATE SURGERY      Family History  Family history unknown: Yes    Social History   Socioeconomic History  . Marital status: Unknown    Spouse name: Not on file  . Number of children: Not on file  . Years of education: Not on file  . Highest education level: Not on file  Occupational History  . Not on file  Tobacco Use  . Smoking status: Never Smoker  . Smokeless tobacco: Never Used  Substance and Sexual Activity  . Alcohol use: No     Comment: occasional  . Drug use: No  . Sexual activity: Yes    Birth control/protection: None  Other Topics Concern  . Not on file  Social History Narrative  . Not on file   Social Determinants of Health   Financial Resource Strain:   . Difficulty of Paying Living Expenses:   Food Insecurity:   . Worried About Programme researcher, broadcasting/film/video in the Last Year:   . Barista in the Last Year:   Transportation Needs:   . Freight forwarder (Medical):   Marland Kitchen Lack of Transportation (Non-Medical):   Physical Activity:   . Days of Exercise per Week:   . Minutes of Exercise per Session:   Stress:   . Feeling of Stress :   Social Connections:   . Frequency of Communication with Friends and Family:   . Frequency of Social Gatherings with Friends and Family:   . Attends Religious Services:   . Active Member of Clubs or Organizations:   . Attends Banker Meetings:   Marland Kitchen Marital Status:   Intimate Partner Violence:   . Fear of Current or Ex-Partner:   . Emotionally Abused:   Marland Kitchen Physically Abused:   . Sexually Abused:      Current Outpatient Medications:  .  atorvastatin (LIPITOR) 10 MG tablet,  Take 10 mg by mouth daily., Disp: , Rfl:  .  Cholecalciferol (VITAMIN D) 50 MCG (2000 UT) CAPS, Take 2,000 Units by mouth daily. , Disp: , Rfl:  .  levothyroxine (LEVOTHROID) 88 MCG tablet, Take 88 mcg by mouth daily before breakfast. , Disp: , Rfl:  .  lisinopril (PRINIVIL,ZESTRIL) 2.5 MG tablet, Take 2.5 mg by mouth daily., Disp: , Rfl:  .  Multiple Vitamin (MULTIVITAMIN) tablet, Take 1 tablet by mouth daily., Disp: , Rfl:  .  nadolol (CORGARD) 20 MG tablet, Take 20 mg by mouth daily. , Disp: , Rfl:  .  warfarin (COUMADIN) 5 MG tablet, Take 5 mg by mouth daily., Disp: , Rfl:    No Known Allergies  ROS Review of Systems  Constitutional: Negative.   HENT: Positive for ear discharge.   Eyes: Negative.   Respiratory: Positive for chest tightness.   Cardiovascular: Negative.   Negative for palpitations and leg swelling.  Gastrointestinal: Negative.   Endocrine: Negative.   Genitourinary: Negative.   Musculoskeletal: Negative.   Allergic/Immunologic: Negative.   Neurological: Negative.   Hematological: Negative.   Psychiatric/Behavioral: Negative for agitation and behavioral problems.      Objective:    Physical Exam  Constitutional: He appears well-nourished.  HENT:  Head: Normocephalic.  Eyes: Pupils are equal, round, and reactive to light.  Pulmonary/Chest: He has no wheezes.  Abdominal: There is no abdominal tenderness.  Musculoskeletal:     Cervical back: Normal range of motion.    BP (!) 137/94   Pulse 84  Wt Readings from Last 3 Encounters:  12/13/18 180 lb (81.6 kg)  12/23/17 182 lb (82.6 kg)  12/10/17 180 lb (81.6 kg)     Health Maintenance Due  Topic Date Due  . COVID-19 Vaccine (1) Never done  . TETANUS/TDAP  Never done  . PNA vac Low Risk Adult (1 of 2 - PCV13) Never done    There are no preventive care reminders to display for this patient.  No results found for: TSH Lab Results  Component Value Date   WBC 8.5 12/23/2017   HGB 15.2 12/23/2017   HCT 47.2 12/23/2017   MCV 94.2 12/23/2017   PLT 200 12/23/2017   Lab Results  Component Value Date   NA 141 12/23/2017   K 4.8 12/23/2017   CO2 28 12/23/2017   GLUCOSE 96 12/23/2017   BUN 17 12/23/2017   CREATININE 1.25 (H) 12/23/2017   BILITOT 1.4 (H) 10/08/2014   ALKPHOS 52 10/08/2014   AST 30 10/08/2014   ALT 17 10/08/2014   PROT 5.9 (L) 10/08/2014   ALBUMIN 3.5 10/08/2014   CALCIUM 8.9 12/23/2017   ANIONGAP 6 12/23/2017   Lab Results  Component Value Date   CHOL 159 10/12/2012   Lab Results  Component Value Date   HDL 59 10/12/2012   Lab Results  Component Value Date   LDLCALC 83 10/12/2012   Lab Results  Component Value Date   TRIG 87 10/12/2012   Lab Results  Component Value Date   CHOLHDL 2.7 10/12/2012   No results found for: HGBA1C      Assessment & Plan:  Note: Medical Device Follow-up  Patient pacemaker was interrogated by pacemakers analyzer, battery status is okay.  No programming changes were indicated after the review of the data.  Histogram shows no change since the last interrogation Atrial and ventricular sensing thresholds were found to be acceptable Impedance was checked and it was found to be normal.  Thresholds were  found to be okay on evaluation of rhythm problem.  No high rate or low rate arrhythmia were noted.  Estimated battery longevity is 11.67yrs. I have personally reviewed the device data and amended the report as necessary.      No orders of the defined types were placed in this encounter.   Follow-up: Return in about 3 months (around 09/02/2019).    Corky Downs, MD

## 2019-06-03 NOTE — Assessment & Plan Note (Signed)
Continue his diet

## 2019-06-03 NOTE — Assessment & Plan Note (Signed)
stable °

## 2019-06-06 ENCOUNTER — Other Ambulatory Visit: Payer: Self-pay | Admitting: Internal Medicine

## 2019-06-16 ENCOUNTER — Other Ambulatory Visit: Payer: Self-pay

## 2019-06-16 ENCOUNTER — Ambulatory Visit: Payer: Medicare PPO | Admitting: Internal Medicine

## 2019-06-16 ENCOUNTER — Encounter: Payer: Self-pay | Admitting: Internal Medicine

## 2019-06-16 VITALS — BP 142/82 | HR 88 | Wt 180.0 lb

## 2019-06-16 DIAGNOSIS — N138 Other obstructive and reflux uropathy: Secondary | ICD-10-CM | POA: Diagnosis not present

## 2019-06-16 DIAGNOSIS — E782 Mixed hyperlipidemia: Secondary | ICD-10-CM

## 2019-06-16 DIAGNOSIS — I4891 Unspecified atrial fibrillation: Secondary | ICD-10-CM

## 2019-06-16 DIAGNOSIS — N401 Enlarged prostate with lower urinary tract symptoms: Secondary | ICD-10-CM

## 2019-06-16 DIAGNOSIS — I495 Sick sinus syndrome: Secondary | ICD-10-CM

## 2019-06-16 DIAGNOSIS — R972 Elevated prostate specific antigen [PSA]: Secondary | ICD-10-CM

## 2019-06-16 DIAGNOSIS — E039 Hypothyroidism, unspecified: Secondary | ICD-10-CM | POA: Diagnosis not present

## 2019-06-16 LAB — POCT INR: INR: 2.3 (ref 2.0–3.0)

## 2019-06-16 NOTE — Progress Notes (Addendum)
Established Patient Office Visit  Subjective:  Patient ID: Johnny Norman, male    DOB: [birth year]  Age: 86 y.o. MRN: [id number]  CC:  Chief Complaint  Patient presents with  . Atrial Fibrillation    pt/inr     HPI  Johnny Norman presents for evaluation of the atrial fibrillation.  He is also on Coumadin and pro time will be drawn. Past Medical History:  Diagnosis Date  . A-fib (HCC)   . CHF (congestive heart failure) (HCC)   . Claudication (HCC)    leg  . Hyperlipidemia   . Peripheral vascular disease (HCC)   . Presence of permanent cardiac pacemaker   . Small bowel obstruction (HCC)   . Thyroid disease     Past Surgical History:  Procedure Laterality Date  . APPENDECTOMY    . BOWEL RESECTION  10/12/2014   Procedure: SMALL BOWEL RESECTION;  Surgeon: Gladis Riffle, MD;  Location: [facility];  Service: General;;  . CHOLECYSTECTOMY    . COLONOSCOPY    . INSERT / REPLACE / REMOVE PACEMAKER  09/13/2008   SSS s/p pacemaker implant; Medtronic pacemaker,.  . LAPAROTOMY N/A 10/12/2014   Procedure: EXPLORATORY LAPAROTOMY;  Surgeon: Gladis Riffle, MD;  Location: [facility];  Service: General;  Laterality: N/A;  . PACEMAKER INSERTION N/A 12/30/2017   Procedure: GENERATOR CHANGE OUT- DUAL CHAMBER;  Surgeon: Corky Downs, MD;  Location: [facility];  Service: Cardiovascular;  Laterality: N/A;  . PROSTATE SURGERY      Family History  Family history unknown: Yes    Social History   Socioeconomic History  . Marital status: Unknown    Spouse name: Not on file  . Number of children: Not on file  . Years of education: Not on file  . Highest education level: Not on file  Occupational History  . Not on file  Tobacco Use  . Smoking status: Never Smoker  . Smokeless tobacco: Never Used  Substance and Sexual Activity  . Alcohol use: No    Comment: occasional  . Drug use: No  . Sexual activity: Yes    Birth control/protection: None  Other Topics Concern  . Not on  file  Social History Narrative  . Not on file   Social Determinants of Health   Financial Resource Strain:   . Difficulty of Paying Living Expenses:   Food Insecurity:   . Worried About Programme researcher, broadcasting/film/video in the Last Year:   . Barista in the Last Year:   Transportation Needs:   . Freight forwarder (Medical):   Marland Kitchen Lack of Transportation (Non-Medical):   Physical Activity:   . Days of Exercise per Week:   . Minutes of Exercise per Session:   Stress:   . Feeling of Stress :   Social Connections:   . Frequency of Communication with Friends and Family:   . Frequency of Social Gatherings with Friends and Family:   . Attends Religious Services:   . Active Member of Clubs or Organizations:   . Attends Banker Meetings:   Marland Kitchen Marital Status:   Intimate Partner Violence:   . Fear of Current or Ex-Partner:   . Emotionally Abused:   Marland Kitchen Physically Abused:   . Sexually Abused:      Current Outpatient Medications:  .  atorvastatin (LIPITOR) 10 MG tablet, TAKE ONE TABLET BY MOUTH EVERY DAY, Disp: 90 tablet, Rfl: 3 .  Cholecalciferol (VITAMIN D) 50 MCG (2000 UT) CAPS,  Take 2,000 Units by mouth daily. , Disp: , Rfl:  .  levothyroxine (LEVOTHROID) 88 MCG tablet, Take 88 mcg by mouth daily before breakfast. , Disp: , Rfl:  .  lisinopril (PRINIVIL,ZESTRIL) 2.5 MG tablet, Take 2.5 mg by mouth daily., Disp: , Rfl:  .  Multiple Vitamin (MULTIVITAMIN) tablet, Take 1 tablet by mouth daily., Disp: , Rfl:  .  warfarin (COUMADIN) 5 MG tablet, Take 5 mg by mouth daily., Disp: , Rfl:    No Known Allergies  ROS Review of Systems  Constitutional: Negative.   HENT: Negative.   Respiratory: Negative.   Cardiovascular: Negative.  Negative for palpitations.  Gastrointestinal: Negative.   Genitourinary: Negative.   Musculoskeletal: Negative.   Neurological: Negative.  Negative for dizziness.  Psychiatric/Behavioral: Negative for behavioral problems.      Objective:      Physical Exam  Constitutional: He is oriented to person, place, and time. He appears well-developed and well-nourished.  HENT:  Head: Normocephalic and atraumatic.  Neck: No JVD present. No tracheal deviation present. No thyromegaly present.  Cardiovascular: Normal heart sounds. Exam reveals no friction rub.  Pulmonary/Chest: He has rales.  Abdominal: He exhibits no distension. There is no guarding.  Musculoskeletal:     Cervical back: Neck supple.  Lymphadenopathy:    He has no cervical adenopathy.  Neurological: He is alert and oriented to person, place, and time. He has normal reflexes.  Psychiatric: He has a normal mood and affect.    BP (!) 142/82   Pulse 88   Wt 180 lb (81.6 kg)   BMI 22.50 kg/m  Wt Readings from Last 3 Encounters:  06/16/19 180 lb (81.6 kg)  12/13/18 180 lb (81.6 kg)  12/23/17 182 lb (82.6 kg)     Health Maintenance Due  Topic Date Due  . COVID-19 Vaccine (1) Never done  . TETANUS/TDAP  Never done  . PNA vac Low Risk Adult (1 of 2 - PCV13) Never done    There are no preventive care reminders to display for this patient.  No results found for: TSH Lab Results  Component Value Date   WBC 8.5 12/23/2017   HGB 15.2 12/23/2017   HCT 47.2 12/23/2017   MCV 94.2 12/23/2017   PLT 200 12/23/2017   Lab Results  Component Value Date   NA 141 12/23/2017   K 4.8 12/23/2017   CO2 28 12/23/2017   GLUCOSE 96 12/23/2017   BUN 17 12/23/2017   CREATININE 1.25 (H) 12/23/2017   BILITOT 1.4 (H) 10/08/2014   ALKPHOS 52 10/08/2014   AST 30 10/08/2014   ALT 17 10/08/2014   PROT 5.9 (L) 10/08/2014   ALBUMIN 3.5 10/08/2014   CALCIUM 8.9 12/23/2017   ANIONGAP 6 12/23/2017   Lab Results  Component Value Date   CHOL 159 10/12/2012   Lab Results  Component Value Date   HDL 59 10/12/2012   Lab Results  Component Value Date   LDLCALC 83 10/12/2012   Lab Results  Component Value Date   TRIG 87 10/12/2012   Lab Results  Component Value Date    CHOLHDL 2.7 10/12/2012   No results found for: HGBA1C    Assessment & Plan: Patient is generally doing good.  He denies any chest pain shortness of breath he got a little bit dizzy when the pollen count is high.   .  He is a tall man and   And has been getting  Dizzy  Getting  up   I told  him to drink a lot of water and take his time when  getting up from the sitting position.  Drink a lot of fluid especially in this weather .  1. Atrial fibrillation, unspecified type (HCC) Stable at the present time - POCT INR    2. Sick sinus syndrome [facility]) Pacemaker working well.  3. Hypothyroidism, unspecified type .  Stable.  4. Mixed hyperlipidemia Low-cholesterol diet.  5. Abnormal prostate specific antigen Follow-up with urologist.   Follow-up: Return in about 4 weeks (around 07/14/2019) for protime check.    Corky Downs, MD

## 2019-06-16 NOTE — Assessment & Plan Note (Signed)
Stable at the present stable at the present patient sees a urologist patient sees a urologist for that.

## 2019-06-16 NOTE — Assessment & Plan Note (Signed)
Stable at the present time. 

## 2019-06-16 NOTE — Assessment & Plan Note (Signed)
Under control at present

## 2019-06-24 NOTE — Assessment & Plan Note (Signed)
No dysuria no problems nocturia.

## 2019-07-18 ENCOUNTER — Ambulatory Visit: Payer: Medicare PPO | Admitting: Internal Medicine

## 2019-07-20 ENCOUNTER — Encounter: Payer: Self-pay | Admitting: Internal Medicine

## 2019-07-20 ENCOUNTER — Ambulatory Visit: Payer: Medicare PPO | Admitting: Internal Medicine

## 2019-07-20 ENCOUNTER — Other Ambulatory Visit: Payer: Self-pay

## 2019-07-20 VITALS — BP 130/88 | HR 72 | Ht 76.0 in | Wt 184.4 lb

## 2019-07-20 DIAGNOSIS — I4891 Unspecified atrial fibrillation: Secondary | ICD-10-CM | POA: Diagnosis not present

## 2019-07-20 DIAGNOSIS — R600 Localized edema: Secondary | ICD-10-CM | POA: Diagnosis not present

## 2019-07-20 DIAGNOSIS — N138 Other obstructive and reflux uropathy: Secondary | ICD-10-CM | POA: Diagnosis not present

## 2019-07-20 DIAGNOSIS — E039 Hypothyroidism, unspecified: Secondary | ICD-10-CM

## 2019-07-20 DIAGNOSIS — N401 Enlarged prostate with lower urinary tract symptoms: Secondary | ICD-10-CM

## 2019-07-20 LAB — POCT INR: INR: 2.4 (ref 2.0–3.0)

## 2019-07-20 NOTE — Assessment & Plan Note (Signed)
Patient denies any chest pain.   pro time and INR is stable.  Chest is clear without any wheezing or rhonchi.  Heart is irregular

## 2019-07-20 NOTE — Assessment & Plan Note (Signed)
Patient has BPH and his lower urinary tract symptoms are stable at the present time

## 2019-07-20 NOTE — Progress Notes (Signed)
Established Patient Office Visit  SUBJECTIVE:  Subjective  Patient ID: Johnny Norman, male    DOB: 03/06/1925  Age: 86 y.o. MRN: [id number]  CC:  Chief Complaint  Patient presents with   Atrial Fibrillation    HPI Johnny Norman is a 86 y.o. male presenting today for follow-up of his PT/INR study.   Today he denies any CP, SOB, palpitations or LOC. He is compliant with all of his medications. He denies any abnormal bleeding issues, including hematochezia and hematuria.  He received his second COVID vaccine on 05/19/2019.  Past Medical History:  Diagnosis Date   A-fib [facility])    CHF (congestive heart failure) (HCC)    Claudication (HCC)    leg   Hyperlipidemia    Peripheral vascular disease (HCC)    Presence of permanent cardiac pacemaker    Small bowel obstruction (HCC)    Thyroid disease     Past Surgical History:  Procedure Laterality Date   APPENDECTOMY     BOWEL RESECTION  10/12/2014   Procedure: SMALL BOWEL RESECTION;  Surgeon: Hubbard Robinson, MD;  Location: [facility] ORS;  Service: General;;   CHOLECYSTECTOMY     COLONOSCOPY     INSERT / REPLACE / REMOVE PACEMAKER  09/13/2008   SSS s/p pacemaker implant; Medtronic pacemaker,.   LAPAROTOMY N/A 10/12/2014   Procedure: EXPLORATORY LAPAROTOMY;  Surgeon: Hubbard Robinson, MD;  Location: [facility] ORS;  Service: General;  Laterality: N/A;   PACEMAKER INSERTION N/A 12/30/2017   Procedure: GENERATOR CHANGE OUT- DUAL CHAMBER;  Surgeon: Cletis Athens, MD;  Location: [facility] ORS;  Service: Cardiovascular;  Laterality: N/A;   PROSTATE SURGERY      Family History  Family history unknown: Yes    Social History   Socioeconomic History   Marital status: Unknown    Spouse name: Not on file   Number of children: Not on file   Years of education: Not on file   Highest education level: Not on file  Occupational History   Not on file  Tobacco Use   Smoking status: Never Smoker   Smokeless tobacco:  Never Used  Vaping Use   Vaping Use: Never used  Substance and Sexual Activity   Alcohol use: No    Comment: occasional   Drug use: No   Sexual activity: Yes    Birth control/protection: None  Other Topics Concern   Not on file  Social History Narrative   Not on file   Social Determinants of Health   Financial Resource Strain:    Difficulty of Paying Living Expenses:   Food Insecurity:    Worried About Charity fundraiser in the Last Year:    Arboriculturist in the Last Year:   Transportation Needs:    Film/video editor (Medical):    Lack of Transportation (Non-Medical):   Physical Activity:    Days of Exercise per Week:    Minutes of Exercise per Session:   Stress:    Feeling of Stress :   Social Connections:    Frequency of Communication with Friends and Family:    Frequency of Social Gatherings with Friends and Family:    Attends Religious Services:    Active Member of Clubs or Organizations:    Attends Archivist Meetings:    Marital Status:   Intimate Partner Violence:    Fear of Current or Ex-Partner:    Emotionally Abused:    Physically Abused:    Sexually Abused:  Current Outpatient Medications:    atorvastatin (LIPITOR) 10 MG tablet, TAKE ONE TABLET BY MOUTH EVERY DAY, Disp: 90 tablet, Rfl: 3   Cholecalciferol (VITAMIN D) 50 MCG (2000 UT) CAPS, Take 2,000 Units by mouth daily. , Disp: , Rfl:    levothyroxine (LEVOTHROID) 88 MCG tablet, Take 88 mcg by mouth daily before breakfast. , Disp: , Rfl:    lisinopril (PRINIVIL,ZESTRIL) 2.5 MG tablet, Take 2.5 mg by mouth daily., Disp: , Rfl:    Multiple Vitamin (MULTIVITAMIN) tablet, Take 1 tablet by mouth daily., Disp: , Rfl:    warfarin (COUMADIN) 5 MG tablet, Take 5 mg by mouth daily., Disp: , Rfl:    No Known Allergies  ROS Review of Systems  Constitutional: Negative.   HENT: Negative.   Eyes: Negative.   Respiratory: Negative.  Negative for shortness of  breath.   Cardiovascular: Negative.  Negative for chest pain and palpitations.  Gastrointestinal: Negative.  Negative for blood in stool.  Endocrine: Negative.   Genitourinary: Negative.  Negative for hematuria.  Musculoskeletal: Negative.   Skin: Negative.   Allergic/Immunologic: Negative.   Neurological: Negative.  Negative for syncope.  Hematological: Negative.   Psychiatric/Behavioral: Negative.   All other systems reviewed and are negative.    OBJECTIVE:    Physical Exam Vitals reviewed.  Constitutional:      Appearance: Normal appearance.  Neck:     Vascular: No carotid bruit.  Cardiovascular:     Rate and Rhythm: Normal rate. Rhythm irregular.     Pulses: Normal pulses.     Heart sounds: Normal heart sounds.  Pulmonary:     Effort: Pulmonary effort is normal.     Breath sounds: Normal breath sounds.  Abdominal:     Palpations: Abdomen is soft. There is no hepatomegaly, splenomegaly or mass.     Tenderness: There is no abdominal tenderness.  Musculoskeletal:     Right lower leg: Edema (1+) present.     Left lower leg: Edema (1+) present.  Skin:    Findings: No rash.  Neurological:     General: No focal deficit present.     Mental Status: He is alert and oriented to person, place, and time.     Gait: Gait abnormal.  Psychiatric:        Mood and Affect: Mood normal.        Behavior: Behavior normal.     BP 130/88    Pulse 72    Ht 6\' 4"  (1.93 m)    Wt 184 lb 6.4 oz (83.6 kg)    BMI 22.45 kg/m  Wt Readings from Last 3 Encounters:  07/20/19 184 lb 6.4 oz (83.6 kg)  06/16/19 180 lb (81.6 kg)  12/13/18 180 lb (81.6 kg)    Health Maintenance Due  Topic Date Due   COVID-19 Vaccine (1) Never done   TETANUS/TDAP  Never done   PNA vac Low Risk Adult (1 of 2 - PCV13) Never done    There are no preventive care reminders to display for this patient.  CBC Latest Ref Rng & Units 12/23/2017 10/16/2014 10/13/2014  WBC 4.0 - 10.5 K/uL 8.5 7.7 13.9(H)  Hemoglobin  13.0 - 17.0 g/dL 10/15/2014 11.5(L) 13.9  Hematocrit 39 - 52 % 47.2 34.0(L) 40.6  Platelets 150 - 400 K/uL 200 200 185   CMP Latest Ref Rng & Units 12/23/2017 10/16/2014 10/14/2014  Glucose 70 - 99 mg/dL 96 10/16/2014) 85  BUN 8 - 23 mg/dL 17 17 671(I)  Creatinine 0.61 -  1.24 mg/dL 2.84(X) 3.24 4.01(U)  Sodium 135 - 145 mmol/L 141 135 137  Potassium 3.5 - 5.1 mmol/L 4.8 3.3(L) 3.8  Chloride 98 - 111 mmol/L 107 106 109  CO2 22 - 32 mmol/L 28 24 24   Calcium 8.9 - 10.3 mg/dL 8.9 7.9(L) 7.7(L)  Total Protein 6.5 - 8.1 g/dL - - -  Total Bilirubin 0.3 - 1.2 mg/dL - - -  Alkaline Phos 38 - 126 U/L - - -  AST 15 - 41 U/L - - -  ALT 17 - 63 U/L - - -    No results found for: TSH Lab Results  Component Value Date   ALBUMIN 3.5 10/08/2014   ANIONGAP 6 12/23/2017   Lab Results  Component Value Date   CHOL 159 10/12/2012   HDL 59 10/12/2012   LDLCALC 83 10/12/2012   CHOLHDL 2.7 10/12/2012   Lab Results  Component Value Date   TRIG 87 10/12/2012   No results found for: HGBA1C    ASSESSMENT & PLAN:   Problem List Items Addressed This Visit      Cardiovascular and Mediastinum   A-fib (HCC) - Primary    Patient denies any chest pain.   pro time and INR is stable.  Chest is clear without any wheezing or rhonchi.  Heart is irregular      Relevant Orders   POCT INR (Completed)     Endocrine   Hypothyroidism    Patient is taking his levothyroxine and his hypothyroidism is under control        Genitourinary   Benign prostatic hyperplasia with urinary obstruction    Patient has BPH and his lower urinary tract symptoms are stable at the present time        Other   Bilateral leg edema    Patient denies any complaint. his swelling of the legs is due to venous stasis.         No orders of the defined types were placed in this encounter.    Follow-up: Return in about 4 weeks (around 08/17/2019).    Dr. 08/19/2019 [facility] [address], [place], [place]  [place]   By signing my name below, I, [id number], attest that this documentation has been prepared under the direction and in the presence of Drue Second, MD. Electronically Signed: Corky Downs, MD 07/20/19, 12:19 PM   I personally performed the services described in this documentation, which was SCRIBED in my presence. The recorded information has been reviewed and considered accurate. It has been edited as necessary during review. 07/22/19, MD

## 2019-07-20 NOTE — Assessment & Plan Note (Signed)
Patient denies any complaint. his swelling of the legs is due to venous stasis.

## 2019-07-20 NOTE — Assessment & Plan Note (Signed)
Patient is taking his levothyroxine and his hypothyroidism is under control

## 2019-08-08 ENCOUNTER — Other Ambulatory Visit: Payer: Self-pay | Admitting: Internal Medicine

## 2019-08-22 ENCOUNTER — Encounter: Payer: Self-pay | Admitting: Internal Medicine

## 2019-08-22 ENCOUNTER — Other Ambulatory Visit: Payer: Self-pay

## 2019-08-22 ENCOUNTER — Ambulatory Visit: Payer: Medicare PPO | Admitting: Internal Medicine

## 2019-08-22 VITALS — BP 126/82 | HR 88 | Ht 74.0 in | Wt 183.4 lb

## 2019-08-22 DIAGNOSIS — I495 Sick sinus syndrome: Secondary | ICD-10-CM

## 2019-08-22 DIAGNOSIS — I4891 Unspecified atrial fibrillation: Secondary | ICD-10-CM

## 2019-08-22 DIAGNOSIS — R2681 Unsteadiness on feet: Secondary | ICD-10-CM | POA: Diagnosis not present

## 2019-08-22 DIAGNOSIS — E039 Hypothyroidism, unspecified: Secondary | ICD-10-CM | POA: Diagnosis not present

## 2019-08-22 LAB — POCT INR: INR: 2.1 (ref 2.0–3.0)

## 2019-08-22 NOTE — Progress Notes (Signed)
Established Patient Office Visit  Subjective:  Patient ID: Johnny Norman, male    DOB: 10-01-1925  Age: 86 y.o. MRN: [id number]  CC:  Chief Complaint  Patient presents with  . Atrial Fibrillation    patient here today for PT/INR       [name] presents for atrial fib patient atrial fibrillation is under control.  He denies chest pain shortness of breath on exertion there is no syncope.    Past Medical History:  Diagnosis Date  . A-fib (HCC)   . CHF (congestive heart failure) (HCC)   . Claudication (HCC)    leg  . Hyperlipidemia   . Peripheral vascular disease (HCC)   . Presence of permanent cardiac pacemaker   . Small bowel obstruction (HCC)   . Thyroid disease     Past Surgical History:  Procedure Laterality Date  . APPENDECTOMY    . BOWEL RESECTION  10/12/2014   Procedure: SMALL BOWEL RESECTION;  Surgeon: Gladis Riffle, MD;  Location: [facility];  Service: General;;  . CHOLECYSTECTOMY    . COLONOSCOPY    . INSERT / REPLACE / REMOVE PACEMAKER  09/13/2008   SSS s/p pacemaker implant; Medtronic pacemaker,.  . LAPAROTOMY N/A 10/12/2014   Procedure: EXPLORATORY LAPAROTOMY;  Surgeon: Gladis Riffle, MD;  Location: [facility];  Service: General;  Laterality: N/A;  . PACEMAKER INSERTION N/A 12/30/2017   Procedure: GENERATOR CHANGE OUT- DUAL CHAMBER;  Surgeon: Corky Downs, MD;  Location: [facility];  Service: Cardiovascular;  Laterality: N/A;  . PROSTATE SURGERY      Family History  Family history unknown: Yes    Social History   Socioeconomic History  . Marital status: Unknown    Spouse name: Not on file  . Number of children: Not on file  . Years of education: Not on file  . Highest education level: Not on file  Occupational History  . Not on file  Tobacco Use  . Smoking status: Never Smoker  . Smokeless tobacco: Never Used  Vaping Use  . Vaping Use: Never used  Substance and Sexual Activity  . Alcohol use: No    Comment: occasional  .  Drug use: No  . Sexual activity: Yes    Birth control/protection: None  Other Topics Concern  . Not on file  Social History Narrative  . Not on file   Social Determinants of Health   Financial Resource Strain:   . Difficulty of Paying Living Expenses:   Food Insecurity:   . Worried About Programme researcher, broadcasting/film/video in the Last Year:   . Barista in the Last Year:   Transportation Needs:   . Freight forwarder (Medical):   Marland Kitchen Lack of Transportation (Non-Medical):   Physical Activity:   . Days of Exercise per Week:   . Minutes of Exercise per Session:   Stress:   . Feeling of Stress :   Social Connections:   . Frequency of Communication with Friends and Family:   . Frequency of Social Gatherings with Friends and Family:   . Attends Religious Services:   . Active Member of Clubs or Organizations:   . Attends Banker Meetings:   Marland Kitchen Marital Status:   Intimate Partner Violence:   . Fear of Current or Ex-Partner:   . Emotionally Abused:   Marland Kitchen Physically Abused:   . Sexually Abused:      Current Outpatient Medications:  .  atorvastatin (LIPITOR) 10 MG tablet, TAKE  ONE TABLET BY MOUTH EVERY DAY, Disp: 90 tablet, Rfl: 3 .  Cholecalciferol (VITAMIN D) 50 MCG (2000 UT) CAPS, Take 2,000 Units by mouth daily. , Disp: , Rfl:  .  levothyroxine (LEVOTHROID) 88 MCG tablet, Take 88 mcg by mouth daily before breakfast. , Disp: , Rfl:  .  lisinopril (ZESTRIL) 2.5 MG tablet, TAKE ONE TABLET EVERY DAY, Disp: 30 tablet, Rfl: 6 .  Multiple Vitamin (MULTIVITAMIN) tablet, Take 1 tablet by mouth daily., Disp: , Rfl:  .  warfarin (COUMADIN) 5 MG tablet, Take 5 mg by mouth daily., Disp: , Rfl:    No Known Allergies  ROS Review of Systems  Constitutional: Negative.   HENT: Negative.   Eyes: Negative.   Respiratory: Negative.   Cardiovascular: Negative.   Gastrointestinal: Negative.   Endocrine: Negative.   Genitourinary: Negative.   Musculoskeletal: Negative.   Skin: Negative.    Allergic/Immunologic: Negative.   Neurological: Negative.   Hematological: Negative.   Psychiatric/Behavioral: Negative.   All other systems reviewed and are negative.     Objective:    Physical Exam Vitals reviewed.  Constitutional:      Appearance: Normal appearance.  HENT:     Mouth/Throat:     Mouth: Mucous membranes are moist.  Eyes:     Pupils: Pupils are equal, round, and reactive to light.  Neck:     Vascular: No carotid bruit.  Cardiovascular:     Rate and Rhythm: Normal rate and regular rhythm.     Pulses: Normal pulses.     Heart sounds: Normal heart sounds.  Pulmonary:     Effort: Pulmonary effort is normal.     Breath sounds: Normal breath sounds.  Abdominal:     General: Bowel sounds are normal.     Palpations: Abdomen is soft. There is no hepatomegaly, splenomegaly or mass.     Tenderness: There is no abdominal tenderness.     Hernia: No hernia is present.  Musculoskeletal:     Cervical back: Neck supple.     Right lower leg: No edema.     Left lower leg: No edema.  Skin:    Findings: No rash.  Neurological:     Mental Status: He is alert and oriented to person, place, and time.     Motor: No weakness.  Psychiatric:        Mood and Affect: Mood normal.        Behavior: Behavior normal.     BP 126/82   Pulse 88   Ht 6\' 2"  (1.88 m)   Wt 183 lb 6.4 oz (83.2 kg)   BMI 23.55 kg/m  Wt Readings from Last 3 Encounters:  08/22/19 183 lb 6.4 oz (83.2 kg)  07/20/19 184 lb 6.4 oz (83.6 kg)  06/16/19 180 lb (81.6 kg)     Health Maintenance Due  Topic Date Due  . COVID-19 Vaccine (1) Never done  . TETANUS/TDAP  Never done  . PNA vac Low Risk Adult (1 of 2 - PCV13) Never done    There are no preventive care reminders to display for this patient.  No results found for: TSH Lab Results  Component Value Date   WBC 8.5 12/23/2017   HGB 15.2 12/23/2017   HCT 47.2 12/23/2017   MCV 94.2 12/23/2017   PLT 200 12/23/2017   Lab Results    Component Value Date   NA 141 12/23/2017   K 4.8 12/23/2017   CO2 28 12/23/2017   GLUCOSE 96 12/23/2017  BUN 17 12/23/2017   CREATININE 1.25 (H) 12/23/2017   BILITOT 1.4 (H) 10/08/2014   ALKPHOS 52 10/08/2014   AST 30 10/08/2014   ALT 17 10/08/2014   PROT 5.9 (L) 10/08/2014   ALBUMIN 3.5 10/08/2014   CALCIUM 8.9 12/23/2017   ANIONGAP 6 12/23/2017   Lab Results  Component Value Date   CHOL 159 10/12/2012   Lab Results  Component Value Date   HDL 59 10/12/2012   Lab Results  Component Value Date   LDLCALC 83 10/12/2012   Lab Results  Component Value Date   TRIG 87 10/12/2012   Lab Results  Component Value Date   CHOLHDL 2.7 10/12/2012   No results found for: HGBA1C    Assessment & Plan:   Problem List Items Addressed This Visit      Cardiovascular and Mediastinum   A-fib (HCC) - Primary    Fibrillation is under control pro time is also stable.  He was advised to take Coumadin 5 mg for 5 days on Saturday take 2.5 mg.  dont Take the Coumadin on Sunday.      Relevant Orders   POCT INR (Completed)   Sick sinus syndrome (HCC)    Eyes any dizziness or syncope.        Endocrine   Hypothyroidism    stable        Other   Unsteady gait    While walking.  Advised to look on the feet.         No orders of the defined types were placed in this encounter.   Follow-up: Return in about 4 weeks (around 09/19/2019).    Corky Downs, MD

## 2019-08-22 NOTE — Assessment & Plan Note (Signed)
While walking.  Advised to look on the feet.

## 2019-08-22 NOTE — Assessment & Plan Note (Signed)
Fibrillation is under control pro time is also stable.  He was advised to take Coumadin 5 mg for 5 days on Saturday take 2.5 mg.  dont Take the Coumadin on Sunday.

## 2019-08-22 NOTE — Assessment & Plan Note (Signed)
stable °

## 2019-08-22 NOTE — Assessment & Plan Note (Signed)
Eyes any dizziness or syncope.

## 2019-08-28 ENCOUNTER — Other Ambulatory Visit: Payer: Self-pay | Admitting: Internal Medicine

## 2019-09-01 ENCOUNTER — Other Ambulatory Visit: Payer: Self-pay

## 2019-09-01 ENCOUNTER — Ambulatory Visit (INDEPENDENT_AMBULATORY_CARE_PROVIDER_SITE_OTHER): Payer: Medicare PPO | Admitting: Internal Medicine

## 2019-09-01 VITALS — BP 124/82 | HR 81

## 2019-09-01 DIAGNOSIS — Z950 Presence of cardiac pacemaker: Secondary | ICD-10-CM | POA: Diagnosis not present

## 2019-09-01 DIAGNOSIS — E039 Hypothyroidism, unspecified: Secondary | ICD-10-CM | POA: Diagnosis not present

## 2019-09-01 DIAGNOSIS — I4891 Unspecified atrial fibrillation: Secondary | ICD-10-CM

## 2019-09-01 DIAGNOSIS — E782 Mixed hyperlipidemia: Secondary | ICD-10-CM

## 2019-09-01 NOTE — Progress Notes (Signed)
Established Patient Office Visit  SUBJECTIVE:  Subjective  Patient ID: Johnny Norman, male    DOB: 09-Nov-1925  Age: 86 y.o. MRN: [id number]  CC:  Chief Complaint  Patient presents with  . Pacemaker Check    HPI Johnny Norman is a 86 y.o. male presenting today for a Pacemaker evaluation check  Note: Medical Device Follow-up  Patient pacemaker was interrogated by pacemakers analyzer, battery status is okay.  No programming changes were indicated after the review of the data.  Histogram shows no change since the last interrogation Atrial and ventricular sensing thresholds were found to be acceptable Impedance was checked and it was found to be normal.  Thresholds were found to be okay on evaluation of rhythm problem.  No high rate or low rate arrhythmia were noted.  Estimated battery longevity is 11 years. I have personally reviewed the device data and amended the report as necessary. Pt has a short run of vtac lasting for several seconds noted by pacemaker. Patient did not have any symptoms. We'll continue to follow up with him and make no change in the Tx plan.    Past Medical History:  Diagnosis Date  . A-fib (HCC)   . CHF (congestive heart failure) (HCC)   . Claudication (HCC)    leg  . Hyperlipidemia   . Peripheral vascular disease (HCC)   . Presence of permanent cardiac pacemaker   . Small bowel obstruction (HCC)   . Thyroid disease     Past Surgical History:  Procedure Laterality Date  . APPENDECTOMY    . BOWEL RESECTION  10/12/2014   Procedure: SMALL BOWEL RESECTION;  Surgeon: Gladis Riffle, MD;  Location: [facility];  Service: General;;  . CHOLECYSTECTOMY    . COLONOSCOPY    . INSERT / REPLACE / REMOVE PACEMAKER  09/13/2008   SSS s/p pacemaker implant; Medtronic pacemaker,.  . LAPAROTOMY N/A 10/12/2014   Procedure: EXPLORATORY LAPAROTOMY;  Surgeon: Gladis Riffle, MD;  Location: [facility];  Service: General;  Laterality: N/A;  . PACEMAKER INSERTION N/A  12/30/2017   Procedure: GENERATOR CHANGE OUT- DUAL CHAMBER;  Surgeon: Corky Downs, MD;  Location: [facility];  Service: Cardiovascular;  Laterality: N/A;  . PROSTATE SURGERY      Family History  Family history unknown: Yes    Social History   Socioeconomic History  . Marital status: Unknown    Spouse name: Not on file  . Number of children: Not on file  . Years of education: Not on file  . Highest education level: Not on file  Occupational History  . Not on file  Tobacco Use  . Smoking status: Never Smoker  . Smokeless tobacco: Never Used  Vaping Use  . Vaping Use: Never used  Substance and Sexual Activity  . Alcohol use: No    Comment: occasional  . Drug use: No  . Sexual activity: Yes    Birth control/protection: None  Other Topics Concern  . Not on file  Social History Narrative  . Not on file   Social Determinants of Health   Financial Resource Strain:   . Difficulty of Paying Living Expenses:   Food Insecurity:   . Worried About Programme researcher, broadcasting/film/video in the Last Year:   . Barista in the Last Year:   Transportation Needs:   . Freight forwarder (Medical):   Marland Kitchen Lack of Transportation (Non-Medical):   Physical Activity:   . Days of Exercise per Week:   .  Minutes of Exercise per Session:   Stress:   . Feeling of Stress :   Social Connections:   . Frequency of Communication with Friends and Family:   . Frequency of Social Gatherings with Friends and Family:   . Attends Religious Services:   . Active Member of Clubs or Organizations:   . Attends Banker Meetings:   Marland Kitchen Marital Status:   Intimate Partner Violence:   . Fear of Current or Ex-Partner:   . Emotionally Abused:   Marland Kitchen Physically Abused:   . Sexually Abused:      Current Outpatient Medications:  .  atorvastatin (LIPITOR) 10 MG tablet, TAKE ONE TABLET BY MOUTH EVERY DAY, Disp: 90 tablet, Rfl: 3 .  Cholecalciferol (VITAMIN D) 50 MCG (2000 UT) CAPS, Take 2,000 Units by mouth  daily. , Disp: , Rfl:  .  levothyroxine (LEVOTHROID) 88 MCG tablet, Take 88 mcg by mouth daily before breakfast. , Disp: , Rfl:  .  lisinopril (ZESTRIL) 2.5 MG tablet, TAKE ONE TABLET EVERY DAY, Disp: 30 tablet, Rfl: 6 .  Multiple Vitamin (MULTIVITAMIN) tablet, Take 1 tablet by mouth daily., Disp: , Rfl:  .  nadolol (CORGARD) 20 MG tablet, TAKE 1 TABLET BY MOUTH DAILY, Disp: 90 tablet, Rfl: 3 .  warfarin (COUMADIN) 5 MG tablet, Take 5 mg by mouth daily., Disp: , Rfl:    No Known Allergies  ROS Review of Systems  Constitutional: Negative.   HENT: Negative.   Eyes: Negative.   Respiratory: Negative.   Cardiovascular: Negative.   Gastrointestinal: Negative.   Endocrine: Negative.   Genitourinary: Negative.   Musculoskeletal: Negative.   Skin: Negative.   Allergic/Immunologic: Negative.   Neurological: Negative.   Hematological: Negative.   Psychiatric/Behavioral: Negative.   All other systems reviewed and are negative.    OBJECTIVE:    Physical Exam Vitals reviewed.  Constitutional:      Appearance: Normal appearance.  HENT:     Mouth/Throat:     Mouth: Mucous membranes are moist.  Eyes:     Pupils: Pupils are equal, round, and reactive to light.  Neck:     Vascular: No carotid bruit.  Cardiovascular:     Rate and Rhythm: Normal rate and regular rhythm.     Pulses: Normal pulses.     Heart sounds: Normal heart sounds.  Pulmonary:     Effort: Pulmonary effort is normal.     Breath sounds: Normal breath sounds.  Abdominal:     General: Bowel sounds are normal.     Palpations: Abdomen is soft. There is no hepatomegaly, splenomegaly or mass.     Tenderness: There is no abdominal tenderness.     Hernia: No hernia is present.  Musculoskeletal:     Cervical back: Neck supple.     Right lower leg: No edema.     Left lower leg: No edema.  Skin:    Findings: No rash.  Neurological:     Mental Status: He is alert and oriented to person, place, and time.     Motor: No  weakness.  Psychiatric:        Mood and Affect: Mood normal.        Behavior: Behavior normal.     BP 124/82   Pulse 81  Wt Readings from Last 3 Encounters:  08/22/19 183 lb 6.4 oz (83.2 kg)  07/20/19 184 lb 6.4 oz (83.6 kg)  06/16/19 180 lb (81.6 kg)    Health Maintenance Due  Topic Date Due  .  COVID-19 Vaccine (1) Never done  . TETANUS/TDAP  Never done  . PNA vac Low Risk Adult (1 of 2 - PCV13) Never done  . INFLUENZA VACCINE  08/28/2019    There are no preventive care reminders to display for this patient.  CBC Latest Ref Rng & Units 12/23/2017 10/16/2014 10/13/2014  WBC 4.0 - 10.5 K/uL 8.5 7.7 13.9(H)  Hemoglobin 13.0 - 17.0 g/dL 54.6 11.5(L) 13.9  Hematocrit 39 - 52 % 47.2 34.0(L) 40.6  Platelets 150 - 400 K/uL 200 200 185   CMP Latest Ref Rng & Units 12/23/2017 10/16/2014 10/14/2014  Glucose 70 - 99 mg/dL 96 503(T) 85  BUN 8 - 23 mg/dL 17 17 46(F)  Creatinine 0.61 - 1.24 mg/dL 6.81(E) 7.51 7.00(F)  Sodium 135 - 145 mmol/L 141 135 137  Potassium 3.5 - 5.1 mmol/L 4.8 3.3(L) 3.8  Chloride 98 - 111 mmol/L 107 106 109  CO2 22 - 32 mmol/L 28 24 24   Calcium 8.9 - 10.3 mg/dL 8.9 7.9(L) 7.7(L)  Total Protein 6.5 - 8.1 g/dL - - -  Total Bilirubin 0.3 - 1.2 mg/dL - - -  Alkaline Phos 38 - 126 U/L - - -  AST 15 - 41 U/L - - -  ALT 17 - 63 U/L - - -    No results found for: TSH Lab Results  Component Value Date   ALBUMIN 3.5 10/08/2014   ANIONGAP 6 12/23/2017   Lab Results  Component Value Date   CHOL 159 10/12/2012   HDL 59 10/12/2012   LDLCALC 83 10/12/2012   CHOLHDL 2.7 10/12/2012   Lab Results  Component Value Date   TRIG 87 10/12/2012   No results found for: HGBA1C    ASSESSMENT & PLAN:   Problem List Items Addressed This Visit      Cardiovascular and Mediastinum   Atrial fibrillation (HCC)    Pt is taking coumadin. His INR is being followed up by PCP.         Endocrine   Hypothyroidism    Stable; being followed up by PCP        Other    Hyperlipidemia    Stable at the present time on medication.       Pacemaker - Primary    Pacemaker is functioning well with a battery life of 11.2 years. Please see report. He has 2 non sustained ventricular tachycardia, lasting several seconds.      Relevant Orders   PACEMAKER IN CLINIC CHECK      No orders of the defined types were placed in this encounter.   Follow-up: No follow-ups on file.    Dr. 10/14/2012 [facility] [address] [place], [place]   By signing my name below, I, [id number], attest that this documentation has been prepared under the direction and in the presence of [employer], MD. Electronically Signed: Corky Downs, MD 09/08/19, 12:54 PM   I personally performed the services described in this documentation, which was SCRIBED in my presence. The recorded information has been reviewed and considered accurate. It has been edited as necessary during review. 11/08/19, MD

## 2019-09-08 DIAGNOSIS — Z950 Presence of cardiac pacemaker: Secondary | ICD-10-CM | POA: Insufficient documentation

## 2019-09-08 NOTE — Assessment & Plan Note (Signed)
Pt is taking coumadin. His INR is being followed up by PCP.

## 2019-09-08 NOTE — Assessment & Plan Note (Signed)
Stable; being followed up by PCP

## 2019-09-08 NOTE — Assessment & Plan Note (Signed)
Pacemaker is functioning well with a battery life of 11.2 years. Please see report. He has 2 non sustained ventricular tachycardia, lasting several seconds.

## 2019-09-08 NOTE — Assessment & Plan Note (Signed)
Stable at the present time on medication 

## 2019-09-22 ENCOUNTER — Ambulatory Visit: Payer: Medicare PPO | Admitting: Internal Medicine

## 2019-10-03 ENCOUNTER — Other Ambulatory Visit: Payer: Self-pay | Admitting: Internal Medicine

## 2019-10-10 ENCOUNTER — Other Ambulatory Visit: Payer: Self-pay

## 2019-10-10 ENCOUNTER — Encounter: Payer: Self-pay | Admitting: Internal Medicine

## 2019-10-10 ENCOUNTER — Ambulatory Visit (INDEPENDENT_AMBULATORY_CARE_PROVIDER_SITE_OTHER): Payer: Medicare PPO | Admitting: Internal Medicine

## 2019-10-10 VITALS — BP 139/88 | HR 92 | Ht 75.0 in | Wt 189.0 lb

## 2019-10-10 DIAGNOSIS — Z0000 Encounter for general adult medical examination without abnormal findings: Secondary | ICD-10-CM

## 2019-10-10 DIAGNOSIS — I4891 Unspecified atrial fibrillation: Secondary | ICD-10-CM

## 2019-10-10 DIAGNOSIS — R2681 Unsteadiness on feet: Secondary | ICD-10-CM | POA: Diagnosis not present

## 2019-10-10 DIAGNOSIS — I495 Sick sinus syndrome: Secondary | ICD-10-CM

## 2019-10-10 DIAGNOSIS — Z23 Encounter for immunization: Secondary | ICD-10-CM

## 2019-10-10 DIAGNOSIS — E039 Hypothyroidism, unspecified: Secondary | ICD-10-CM

## 2019-10-10 DIAGNOSIS — E782 Mixed hyperlipidemia: Secondary | ICD-10-CM | POA: Diagnosis not present

## 2019-10-10 LAB — POCT INR: INR: 1.7 — AB (ref 2.0–3.0)

## 2019-10-10 MED ORDER — FUROSEMIDE 20 MG PO TABS: 20.0000 mg | ORAL_TABLET | Freq: Every day | ORAL | 3 refills | Status: DC

## 2019-10-10 NOTE — Assessment & Plan Note (Signed)
Pt is on coumadin. His PT is 24.3, which is theraputic.

## 2019-10-10 NOTE — Progress Notes (Signed)
Established Patient Office Visit  SUBJECTIVE:  Subjective  Patient ID: Johnny Norman, male    DOB: [birth year]  Age: 86 y.o. MRN: [id number]  CC:  Chief Complaint  Patient presents with  . Annual Exam  . Coumadin Management    HPI Johnny Norman is a 86 y.o. male presenting today for an annual exam.  He has been doing well overall. His blood pressure today is 139/88. His INR is 1.7 today. He takes his medication as directed and without any difficulties. He declines any missed doses.   He is vaccinated against COVID19. He would like to get his flu vaccine today.     Past Medical History:  Diagnosis Date  . A-fib (HCC)   . CHF (congestive heart failure) (HCC)   . Claudication (HCC)    leg  . Hyperlipidemia   . Peripheral vascular disease (HCC)   . Presence of permanent cardiac pacemaker   . Small bowel obstruction (HCC)   . Thyroid disease     Past Surgical History:  Procedure Laterality Date  . APPENDECTOMY    . BOWEL RESECTION  10/12/2014   Procedure: SMALL BOWEL RESECTION;  Surgeon: Gladis Riffle, MD;  Location: [facility] ORS;  Service: General;;  . CHOLECYSTECTOMY    . COLONOSCOPY    . INSERT / REPLACE / REMOVE PACEMAKER  09/13/2008   SSS s/p pacemaker implant; Medtronic pacemaker,.  . LAPAROTOMY N/A 10/12/2014   Procedure: EXPLORATORY LAPAROTOMY;  Surgeon: Gladis Riffle, MD;  Location: [facility] ORS;  Service: General;  Laterality: N/A;  . PACEMAKER INSERTION N/A 12/30/2017   Procedure: GENERATOR CHANGE OUT- DUAL CHAMBER;  Surgeon: Corky Downs, MD;  Location: [facility] ORS;  Service: Cardiovascular;  Laterality: N/A;  . PROSTATE SURGERY      Family History  Family history unknown: Yes    Social History   Socioeconomic History  . Marital status: Unknown    Spouse name: Not on file  . Number of children: Not on file  . Years of education: Not on file  . Highest education level: Not on file  Occupational History  . Not on file  Tobacco Use  . Smoking  status: Never Smoker  . Smokeless tobacco: Never Used  Vaping Use  . Vaping Use: Never used  Substance and Sexual Activity  . Alcohol use: No    Comment: occasional  . Drug use: No  . Sexual activity: Yes    Birth control/protection: None  Other Topics Concern  . Not on file  Social History Narrative  . Not on file   Social Determinants of Health   Financial Resource Strain:   . Difficulty of Paying Living Expenses: Not on file  Food Insecurity:   . Worried About Programme researcher, broadcasting/film/video in the Last Year: Not on file  . Ran Out of Food in the Last Year: Not on file  Transportation Needs:   . Lack of Transportation (Medical): Not on file  . Lack of Transportation (Non-Medical): Not on file  Physical Activity:   . Days of Exercise per Week: Not on file  . Minutes of Exercise per Session: Not on file  Stress:   . Feeling of Stress : Not on file  Social Connections:   . Frequency of Communication with Friends and Family: Not on file  . Frequency of Social Gatherings with Friends and Family: Not on file  . Attends Religious Services: Not on file  . Active Member of Clubs or Organizations: Not on file  .  Attends Banker Meetings: Not on file  . Marital Status: Not on file  Intimate Partner Violence:   . Fear of Current or Ex-Partner: Not on file  . Emotionally Abused: Not on file  . Physically Abused: Not on file  . Sexually Abused: Not on file     Current Outpatient Medications:  .  atorvastatin (LIPITOR) 10 MG tablet, TAKE ONE TABLET BY MOUTH EVERY DAY, Disp: 90 tablet, Rfl: 3 .  Cholecalciferol (VITAMIN D) 50 MCG (2000 UT) CAPS, Take 2,000 Units by mouth daily. , Disp: , Rfl:  .  levothyroxine (SYNTHROID) 88 MCG tablet, TAKE 1 TABLET EVERY DAY ON EMPTY STOMACHWITH A GLASS OF WATER AT LEAST 30-60 MINBEFORE BREAKFAST, Disp: 90 tablet, Rfl: 3 .  lisinopril (ZESTRIL) 2.5 MG tablet, TAKE ONE TABLET EVERY DAY, Disp: 30 tablet, Rfl: 6 .  Multiple Vitamin (MULTIVITAMIN)  tablet, Take 1 tablet by mouth daily., Disp: , Rfl:  .  nadolol (CORGARD) 20 MG tablet, TAKE 1 TABLET BY MOUTH DAILY, Disp: 90 tablet, Rfl: 3 .  warfarin (COUMADIN) 5 MG tablet, Take 5 mg by mouth daily., Disp: , Rfl:  .  furosemide (LASIX) 20 MG tablet, Take 1 tablet (20 mg total) by mouth daily., Disp: 30 tablet, Rfl: 3   No Known Allergies  ROS Review of Systems  Constitutional: Negative.   HENT: Positive for rhinorrhea and sneezing.   Eyes: Negative.   Respiratory: Positive for cough. Negative for shortness of breath.   Cardiovascular: Negative.  Negative for chest pain.  Gastrointestinal: Negative.   Endocrine: Negative.   Genitourinary: Negative.   Musculoskeletal: Negative.   Skin: Negative.   Allergic/Immunologic: Positive for environmental allergies.  Neurological: Negative.  Negative for dizziness and syncope.  Hematological: Negative.   Psychiatric/Behavioral: Negative.   All other systems reviewed and are negative.    OBJECTIVE:    Physical Exam Vitals reviewed.  Constitutional:      Appearance: Normal appearance.  HENT:     Mouth/Throat:     Mouth: Mucous membranes are moist.  Eyes:     Pupils: Pupils are equal, round, and reactive to light.  Neck:     Vascular: No carotid bruit.  Cardiovascular:     Rate and Rhythm: Normal rate and regular rhythm.     Pulses: Normal pulses.     Heart sounds: Normal heart sounds.  Pulmonary:     Effort: Pulmonary effort is normal.     Breath sounds: Normal breath sounds.  Chest:     Comments: Pacemaker on left side Abdominal:     General: Bowel sounds are normal.     Palpations: Abdomen is soft. There is no hepatomegaly, splenomegaly or mass.     Tenderness: There is no abdominal tenderness.     Hernia: No hernia is present.  Musculoskeletal:     Cervical back: Neck supple.     Right lower leg: 2+ Edema present.     Left lower leg: 2+ Edema present.  Skin:    Findings: No rash.  Neurological:     Mental Status:  He is alert and oriented to person, place, and time.     Motor: No weakness.  Psychiatric:        Mood and Affect: Mood normal.        Behavior: Behavior normal.     BP 139/88   Pulse 92   Ht 6\' 3"  (1.905 m)   Wt 189 lb (85.7 kg)   BMI 23.62 kg/m  Wt  Readings from Last 3 Encounters:  10/10/19 189 lb (85.7 kg)  08/22/19 183 lb 6.4 oz (83.2 kg)  07/20/19 184 lb 6.4 oz (83.6 kg)    Health Maintenance Due  Topic Date Due  . COVID-19 Vaccine (1) Never done  . TETANUS/TDAP  Never done  . PNA vac Low Risk Adult (1 of 2 - PCV13) Never done  . INFLUENZA VACCINE  Never done    There are no preventive care reminders to display for this patient.  CBC Latest Ref Rng & Units 12/23/2017 10/16/2014 10/13/2014  WBC 4.0 - 10.5 K/uL 8.5 7.7 13.9(H)  Hemoglobin 13.0 - 17.0 g/dL 16.115.2 11.5(L) 13.9  Hematocrit 39 - 52 % 47.2 34.0(L) 40.6  Platelets 150 - 400 K/uL 200 200 185   CMP Latest Ref Rng & Units 12/23/2017 10/16/2014 10/14/2014  Glucose 70 - 99 mg/dL 96 096(E122(H) 85  BUN 8 - 23 mg/dL 17 17 45(W24(H)  Creatinine 0.61 - 1.24 mg/dL 0.98(J1.25(H) 1.911.08 4.78(G1.38(H)  Sodium 135 - 145 mmol/L 141 135 137  Potassium 3.5 - 5.1 mmol/L 4.8 3.3(L) 3.8  Chloride 98 - 111 mmol/L 107 106 109  CO2 22 - 32 mmol/L 28 24 24   Calcium 8.9 - 10.3 mg/dL 8.9 7.9(L) 7.7(L)  Total Protein 6.5 - 8.1 g/dL - - -  Total Bilirubin 0.3 - 1.2 mg/dL - - -  Alkaline Phos 38 - 126 U/L - - -  AST 15 - 41 U/L - - -  ALT 17 - 63 U/L - - -    No results found for: TSH Lab Results  Component Value Date   ALBUMIN 3.5 10/08/2014   ANIONGAP 6 12/23/2017   Lab Results  Component Value Date   CHOL 159 10/12/2012   HDL 59 10/12/2012   LDLCALC 83 10/12/2012   CHOLHDL 2.7 10/12/2012   Lab Results  Component Value Date   TRIG 87 10/12/2012   No results found for: HGBA1C    ASSESSMENT & PLAN:   Problem List Items Addressed This Visit      Cardiovascular and Mediastinum   Atrial fibrillation (HCC) - Primary    Pt is on  coumadin. His PT is 24.3, which is theraputic.       Relevant Medications   furosemide (LASIX) 20 MG tablet   Other Relevant Orders   POCT INR (Completed)   Sick sinus syndrome (HCC)    Afib is under control on nadolol      Relevant Medications   furosemide (LASIX) 20 MG tablet     Endocrine   Hypothyroidism    Pt takes levothyroxin 88mcg every day        Other   Hyperlipidemia    - The patient's hyperlipidemia is stable on lipitor. - The patient will continue the current treatment regimen.  - I encouraged the patient to eat more vegetables and whole wheat, and to avoid fatty foods like whole milk, hard cheese, egg yolks, margarine, baked sweets, and fried foods.  - I encouraged the patient to live an active lifestyle and complete activities for 40 minutes at least three times per week.  - I instructed the patient to go to the ER if they begin having chest pain.        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 He has  2+ swelling of the legs and we will send him some lasix 20mg  po daily.       Need for influenza vaccination    Flu vaccine given today.       Relevant Orders   Flu Vaccine QUAD High Dose(Fluad)      Meds ordered this encounter  Medications  . furosemide (LASIX) 20 MG tablet    Sig: Take 1 tablet (20 mg total) by mouth daily.    Dispense:  30 tablet    Refill:  3    Follow-up: Return in about 1 month (around 11/09/2019) for INR check.    11/11/2019, MD [facility] [address] [place], [place]   By signing my name below, I, [id number], attest that this documentation has been prepared under the direction and in the presence of Dr. [employer] Electronically Signed: Corky Downs, MD 10/10/19, 10:56 AM  I personally performed the services described in this documentation,  which was SCRIBED in my presence. The recorded information has been reviewed and considered accurate. It has been edited as necessary during review. 10/12/19, MD

## 2019-10-10 NOTE — Assessment & Plan Note (Signed)
-   The patient's hyperlipidemia is stable on lipitor. - The patient will continue the current treatment regimen.  - I encouraged the patient to eat more vegetables and whole wheat, and to avoid fatty foods like whole milk, hard cheese, egg yolks, margarine, baked sweets, and fried foods.  - I encouraged the patient to live an active lifestyle and complete activities for 40 minutes at least three times per week.  - I instructed the patient to go to the ER if they begin having chest pain.   

## 2019-10-10 NOTE — Assessment & Plan Note (Signed)
Afib is under control on nadolol

## 2019-10-10 NOTE — Assessment & Plan Note (Signed)
Pt physical exam is normal. He has 2+ swelling of the legs and we will send him some lasix 20mg  po daily.

## 2019-10-10 NOTE — Assessment & Plan Note (Signed)
Flu vaccine given today. 

## 2019-10-10 NOTE — Assessment & Plan Note (Signed)
Pt takes levothyroxin every day

## 2019-10-10 NOTE — Assessment & Plan Note (Signed)
Pt denies any symptoms. He denies a prostate exam at this time.

## 2019-10-10 NOTE — Assessment & Plan Note (Signed)
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

## 2019-11-09 ENCOUNTER — Other Ambulatory Visit: Payer: Self-pay | Admitting: Internal Medicine

## 2019-11-09 ENCOUNTER — Ambulatory Visit: Payer: Medicare PPO | Admitting: Internal Medicine

## 2019-11-09 ENCOUNTER — Encounter: Payer: Self-pay | Admitting: Internal Medicine

## 2019-11-09 ENCOUNTER — Other Ambulatory Visit: Payer: Self-pay

## 2019-11-09 VITALS — BP 154/82 | HR 87 | Ht 74.0 in | Wt 191.6 lb

## 2019-11-09 DIAGNOSIS — R2681 Unsteadiness on feet: Secondary | ICD-10-CM

## 2019-11-09 DIAGNOSIS — I4891 Unspecified atrial fibrillation: Secondary | ICD-10-CM | POA: Diagnosis not present

## 2019-11-09 DIAGNOSIS — E039 Hypothyroidism, unspecified: Secondary | ICD-10-CM

## 2019-11-09 DIAGNOSIS — Z950 Presence of cardiac pacemaker: Secondary | ICD-10-CM | POA: Diagnosis not present

## 2019-11-09 DIAGNOSIS — I495 Sick sinus syndrome: Secondary | ICD-10-CM

## 2019-11-09 LAB — POCT INR: INR: 2.2 (ref 2.0–3.0)

## 2019-11-09 NOTE — Assessment & Plan Note (Signed)
Patient takes his thyroid medication regularly.

## 2019-11-09 NOTE — Assessment & Plan Note (Signed)
Atrial fibrillation is stable.  He sometimes complain of unsteady gait, denies any history of passing out spell.  Chest is clear without any rales.  There is no pedal edema.

## 2019-11-09 NOTE — Assessment & Plan Note (Signed)
Patient comes to see me every 3 months with a pacemaker check.

## 2019-11-09 NOTE — Assessment & Plan Note (Signed)
Patient has a pacemaker functioning well he is in atrial fibrillation.  Pro time is therapeutic so he was advised to continue taking his previous dose of Coumadin every day.  Check a pro time in a month again.

## 2019-11-09 NOTE — Progress Notes (Signed)
Established Patient Office Visit  SUBJECTIVE:  Subjective  Patient ID: Johnny Norman, male    DOB: 1925/06/25  Age: 86 y.o. MRN: [id number]  CC:  Chief Complaint  Patient presents with  . Atrial Fibrillation    HPI Johnny Norman is a 86 y.o. male presenting today for a follow up of his atrial fibrillation.   His INR today is normal at 2.2. He is taking his medications as directed and without any complication. He denies any missed doses. His blood pressure was 154/82 today.    Past Medical History:  Diagnosis Date  . A-fib (HCC)   . CHF (congestive heart failure) (HCC)   . Claudication (HCC)    leg  . Hyperlipidemia   . Peripheral vascular disease (HCC)   . Presence of permanent cardiac pacemaker   . Small bowel obstruction (HCC)   . Thyroid disease     Past Surgical History:  Procedure Laterality Date  . APPENDECTOMY    . BOWEL RESECTION  10/12/2014   Procedure: SMALL BOWEL RESECTION;  Surgeon: Gladis Riffle, MD;  Location: [facility] ORS;  Service: General;;  . CHOLECYSTECTOMY    . COLONOSCOPY    . INSERT / REPLACE / REMOVE PACEMAKER  09/13/2008   SSS s/p pacemaker implant; Medtronic pacemaker,.  . LAPAROTOMY N/A 10/12/2014   Procedure: EXPLORATORY LAPAROTOMY;  Surgeon: Gladis Riffle, MD;  Location: [facility] ORS;  Service: General;  Laterality: N/A;  . PACEMAKER INSERTION N/A 12/30/2017   Procedure: GENERATOR CHANGE OUT- DUAL CHAMBER;  Surgeon: Corky Downs, MD;  Location: [facility] ORS;  Service: Cardiovascular;  Laterality: N/A;  . PROSTATE SURGERY      Family History  Family history unknown: Yes    Social History   Socioeconomic History  . Marital status: Unknown    Spouse name: Not on file  . Number of children: Not on file  . Years of education: Not on file  . Highest education level: Not on file  Occupational History  . Not on file  Tobacco Use  . Smoking status: Never Smoker  . Smokeless tobacco: Never Used  Vaping Use  . Vaping Use: Never  used  Substance and Sexual Activity  . Alcohol use: No    Comment: occasional  . Drug use: No  . Sexual activity: Yes    Birth control/protection: None  Other Topics Concern  . Not on file  Social History Narrative  . Not on file   Social Determinants of Health   Financial Resource Strain:   . Difficulty of Paying Living Expenses: Not on file  Food Insecurity:   . Worried About Programme researcher, broadcasting/film/video in the Last Year: Not on file  . Ran Out of Food in the Last Year: Not on file  Transportation Needs:   . Lack of Transportation (Medical): Not on file  . Lack of Transportation (Non-Medical): Not on file  Physical Activity:   . Days of Exercise per Week: Not on file  . Minutes of Exercise per Session: Not on file  Stress:   . Feeling of Stress : Not on file  Social Connections:   . Frequency of Communication with Friends and Family: Not on file  . Frequency of Social Gatherings with Friends and Family: Not on file  . Attends Religious Services: Not on file  . Active Member of Clubs or Organizations: Not on file  . Attends Banker Meetings: Not on file  . Marital Status: Not on file  Intimate  Partner Violence:   . Fear of Current or Ex-Partner: Not on file  . Emotionally Abused: Not on file  . Physically Abused: Not on file  . Sexually Abused: Not on file     Current Outpatient Medications:  .  atorvastatin (LIPITOR) 10 MG tablet, TAKE ONE TABLET BY MOUTH EVERY DAY, Disp: 90 tablet, Rfl: 3 .  Cholecalciferol (VITAMIN D) 50 MCG (2000 UT) CAPS, Take 2,000 Units by mouth daily. , Disp: , Rfl:  .  furosemide (LASIX) 20 MG tablet, Take 1 tablet (20 mg total) by mouth daily., Disp: 30 tablet, Rfl: 3 .  levothyroxine (SYNTHROID) 88 MCG tablet, TAKE 1 TABLET EVERY DAY ON EMPTY STOMACHWITH A GLASS OF WATER AT LEAST 30-60 MINBEFORE BREAKFAST, Disp: 90 tablet, Rfl: 3 .  lisinopril (ZESTRIL) 2.5 MG tablet, TAKE ONE TABLET EVERY DAY, Disp: 30 tablet, Rfl: 6 .  Multiple  Vitamin (MULTIVITAMIN) tablet, Take 1 tablet by mouth daily., Disp: , Rfl:  .  nadolol (CORGARD) 20 MG tablet, TAKE 1 TABLET BY MOUTH DAILY, Disp: 90 tablet, Rfl: 3 .  warfarin (COUMADIN) 5 MG tablet, Take 5 mg by mouth daily., Disp: , Rfl:    No Known Allergies  ROS Review of Systems  Constitutional: Negative.   HENT: Negative.   Eyes: Negative.   Respiratory: Negative.   Cardiovascular: Negative.   Gastrointestinal: Negative.   Endocrine: Negative.   Genitourinary: Negative.   Musculoskeletal: Negative.   Skin: Negative.   Allergic/Immunologic: Negative.   Neurological: Negative.  Negative for dizziness.  Hematological: Negative.   Psychiatric/Behavioral: Negative.   All other systems reviewed and are negative.    OBJECTIVE:    Physical Exam Vitals reviewed.  Constitutional:      Appearance: Normal appearance. He is normal weight.  HENT:     Right Ear: Decreased hearing noted.     Left Ear: Decreased hearing noted.     Mouth/Throat:     Mouth: Mucous membranes are moist.  Eyes:     Pupils: Pupils are equal, round, and reactive to light.  Neck:     Vascular: No carotid bruit.  Cardiovascular:     Rate and Rhythm: Normal rate and regular rhythm.     Pulses: Normal pulses.     Heart sounds: Normal heart sounds.  Pulmonary:     Effort: Pulmonary effort is normal.     Breath sounds: Normal breath sounds.  Abdominal:     General: Bowel sounds are normal.     Palpations: Abdomen is soft. There is no hepatomegaly, splenomegaly or mass.     Tenderness: There is no abdominal tenderness.     Hernia: No hernia is present.  Musculoskeletal:     Cervical back: Neck supple.     Right lower leg: No edema.     Left lower leg: No edema.  Skin:    Findings: No rash.  Neurological:     Mental Status: He is alert and oriented to person, place, and time.     Motor: No weakness.  Psychiatric:        Mood and Affect: Mood normal.        Behavior: Behavior normal.     BP  (!) 154/82   Pulse 87   Ht 6\' 2"  (1.88 m)   Wt 191 lb 9.6 oz (86.9 kg)   BMI 24.60 kg/m  Wt Readings from Last 3 Encounters:  11/09/19 191 lb 9.6 oz (86.9 kg)  10/10/19 189 lb (85.7 kg)  08/22/19 183 lb  6.4 oz (83.2 kg)    Health Maintenance Due  Topic Date Due  . COVID-19 Vaccine (1) Never done  . TETANUS/TDAP  Never done  . PNA vac Low Risk Adult (1 of 2 - PCV13) Never done    There are no preventive care reminders to display for this patient.  CBC Latest Ref Rng & Units 12/23/2017 10/16/2014 10/13/2014  WBC 4.0 - 10.5 K/uL 8.5 7.7 13.9(H)  Hemoglobin 13.0 - 17.0 g/dL 93.7 11.5(L) 13.9  Hematocrit 39 - 52 % 47.2 34.0(L) 40.6  Platelets 150 - 400 K/uL 200 200 185   CMP Latest Ref Rng & Units 12/23/2017 10/16/2014 10/14/2014  Glucose 70 - 99 mg/dL 96 342(A) 85  BUN 8 - 23 mg/dL 17 17 76(O)  Creatinine 0.61 - 1.24 mg/dL 1.15(B) 2.62 0.35(D)  Sodium 135 - 145 mmol/L 141 135 137  Potassium 3.5 - 5.1 mmol/L 4.8 3.3(L) 3.8  Chloride 98 - 111 mmol/L 107 106 109  CO2 22 - 32 mmol/L 28 24 24   Calcium 8.9 - 10.3 mg/dL 8.9 7.9(L) 7.7(L)  Total Protein 6.5 - 8.1 g/dL - - -  Total Bilirubin 0.3 - 1.2 mg/dL - - -  Alkaline Phos 38 - 126 U/L - - -  AST 15 - 41 U/L - - -  ALT 17 - 63 U/L - - -    No results found for: TSH Lab Results  Component Value Date   ALBUMIN 3.5 10/08/2014   ANIONGAP 6 12/23/2017   Lab Results  Component Value Date   CHOL 159 10/12/2012   HDL 59 10/12/2012   LDLCALC 83 10/12/2012   CHOLHDL 2.7 10/12/2012   Lab Results  Component Value Date   TRIG 87 10/12/2012   No results found for: HGBA1C    ASSESSMENT & PLAN:   Problem List Items Addressed This Visit      Cardiovascular and Mediastinum   Atrial fibrillation (HCC) - Primary    Atrial fibrillation is stable.  He sometimes complain of unsteady gait, denies any history of passing out spell.  Chest is clear without any rales.  There is no pedal edema.      Relevant Orders   POCT INR  (Completed)   Sick sinus syndrome [facility])    Patient has a pacemaker functioning well he is in atrial fibrillation.  Pro time is therapeutic so he was advised to continue taking his previous dose of Coumadin every day.  Check a pro time in a month again.        Endocrine   Hypothyroidism    Patient takes his thyroid medication regularly.        Other   Unsteady gait    Patient has an unsteady gait.  He was advised to use a cane.  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      Pacemaker    Patient comes to see me every 3 months with a pacemaker check.         No orders of the defined types were placed in this encounter.   Follow-up: No follow-ups on file.    [facility], MD [facility] [address] [place], [place]   By signing my name below, I, [id number], attest that this documentation has been prepared under the direction and in the presence of Dr. [employer] Electronically Signed: Corky Downs, MD 11/09/19, 9:55 AM  I personally performed the services described in this documentation, which was  SCRIBED in my presence. The recorded information has been reviewed and considered accurate. It has been edited as necessary during review. Cletis Athens, MD

## 2019-11-09 NOTE — Assessment & Plan Note (Signed)
Patient has an unsteady gait.  He was advised to use a cane.  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

## 2019-11-16 ENCOUNTER — Other Ambulatory Visit: Payer: Self-pay | Admitting: *Deleted

## 2019-11-16 ENCOUNTER — Other Ambulatory Visit: Payer: Self-pay

## 2019-11-16 ENCOUNTER — Ambulatory Visit (INDEPENDENT_AMBULATORY_CARE_PROVIDER_SITE_OTHER): Payer: Medicare PPO | Admitting: Internal Medicine

## 2019-11-16 ENCOUNTER — Encounter: Payer: Self-pay | Admitting: Internal Medicine

## 2019-11-16 VITALS — BP 151/86 | HR 82 | Ht 74.0 in | Wt 187.5 lb

## 2019-11-16 DIAGNOSIS — Z950 Presence of cardiac pacemaker: Secondary | ICD-10-CM | POA: Diagnosis not present

## 2019-11-16 DIAGNOSIS — E039 Hypothyroidism, unspecified: Secondary | ICD-10-CM

## 2019-11-16 DIAGNOSIS — I4891 Unspecified atrial fibrillation: Secondary | ICD-10-CM

## 2019-11-16 DIAGNOSIS — R002 Palpitations: Secondary | ICD-10-CM | POA: Diagnosis not present

## 2019-11-16 DIAGNOSIS — I10 Essential (primary) hypertension: Secondary | ICD-10-CM | POA: Diagnosis not present

## 2019-11-16 NOTE — Assessment & Plan Note (Signed)
Patient takes his levothyroxine on a regular basis 

## 2019-11-16 NOTE — Progress Notes (Signed)
Established Patient Office Visit  Subjective:  Patient ID: Johnny Norman, male    DOB: [birth year]  Age: 86 y.o. MRN: [id number]  CC:  Chief Complaint  Patient presents with  . EMS follow up    patient had a funny feeling a couple evening ago and called EMS becuase he was concerned of having a heart attack. Patient denies having any sob, chest pain, dizziness, or tingling. He just had a funny feeling come over hime and lasted only a few mins. EMS told him everything looked okay and to follow up with his primary care.     Palpitations  This is a new problem. The current episode started in the past 7 days (Patient felt skipped beat in the chest it happened only once no recurrence.). Associated symptoms include an irregular heartbeat. Pertinent negatives include no anxiety, chest fullness, chest pain, coughing, diaphoresis, dizziness, fever, malaise/fatigue, nausea, near-syncope or vomiting. He has tried nothing for the symptoms. Risk factors include being male. His past medical history is significant for heart disease.    [name] presents for skipped beat  Past Medical History:  Diagnosis Date  . A-fib (HCC)   . CHF (congestive heart failure) (HCC)   . Claudication (HCC)    leg  . Hyperlipidemia   . Peripheral vascular disease (HCC)   . Presence of permanent cardiac pacemaker   . Small bowel obstruction (HCC)   . Thyroid disease     Past Surgical History:  Procedure Laterality Date  . APPENDECTOMY    . BOWEL RESECTION  10/12/2014   Procedure: SMALL BOWEL RESECTION;  Surgeon: Gladis Riffle, MD;  Location: [facility];  Service: General;;  . CHOLECYSTECTOMY    . COLONOSCOPY    . INSERT / REPLACE / REMOVE PACEMAKER  09/13/2008   SSS s/p pacemaker implant; Medtronic pacemaker,.  . LAPAROTOMY N/A 10/12/2014   Procedure: EXPLORATORY LAPAROTOMY;  Surgeon: Gladis Riffle, MD;  Location: [facility];  Service: General;  Laterality: N/A;  . PACEMAKER INSERTION N/A  12/30/2017   Procedure: GENERATOR CHANGE OUT- DUAL CHAMBER;  Surgeon: Corky Downs, MD;  Location: [facility];  Service: Cardiovascular;  Laterality: N/A;  . PROSTATE SURGERY      Family History  Family history unknown: Yes    Social History   Socioeconomic History  . Marital status: Unknown    Spouse name: Not on file  . Number of children: Not on file  . Years of education: Not on file  . Highest education level: Not on file  Occupational History  . Not on file  Tobacco Use  . Smoking status: Never Smoker  . Smokeless tobacco: Never Used  Vaping Use  . Vaping Use: Never used  Substance and Sexual Activity  . Alcohol use: No    Comment: occasional  . Drug use: No  . Sexual activity: Yes    Birth control/protection: None  Other Topics Concern  . Not on file  Social History Narrative  . Not on file   Social Determinants of Health   Financial Resource Strain:   . Difficulty of Paying Living Expenses: Not on file  Food Insecurity:   . Worried About Programme researcher, broadcasting/film/video in the Last Year: Not on file  . Ran Out of Food in the Last Year: Not on file  Transportation Needs:   . Lack of Transportation (Medical): Not on file  . Lack of Transportation (Non-Medical): Not on file  Physical Activity:   . Days of  Exercise per Week: Not on file  . Minutes of Exercise per Session: Not on file  Stress:   . Feeling of Stress : Not on file  Social Connections:   . Frequency of Communication with Friends and Family: Not on file  . Frequency of Social Gatherings with Friends and Family: Not on file  . Attends Religious Services: Not on file  . Active Member of Clubs or Organizations: Not on file  . Attends Banker Meetings: Not on file  . Marital Status: Not on file  Intimate Partner Violence:   . Fear of Current or Ex-Partner: Not on file  . Emotionally Abused: Not on file  . Physically Abused: Not on file  . Sexually Abused: Not on file     Current Outpatient  Medications:  .  atorvastatin (LIPITOR) 10 MG tablet, TAKE ONE TABLET BY MOUTH EVERY DAY, Disp: 90 tablet, Rfl: 3 .  Cholecalciferol (VITAMIN D) 50 MCG (2000 UT) CAPS, Take 2,000 Units by mouth daily. , Disp: , Rfl:  .  furosemide (LASIX) 20 MG tablet, Take 1 tablet (20 mg total) by mouth daily., Disp: 30 tablet, Rfl: 3 .  levothyroxine (SYNTHROID) 88 MCG tablet, TAKE 1 TABLET EVERY DAY ON EMPTY STOMACHWITH A GLASS OF WATER AT LEAST 30-60 MINBEFORE BREAKFAST, Disp: 90 tablet, Rfl: 3 .  lisinopril (ZESTRIL) 2.5 MG tablet, TAKE ONE TABLET EVERY DAY, Disp: 30 tablet, Rfl: 6 .  Multiple Vitamin (MULTIVITAMIN) tablet, Take 1 tablet by mouth daily., Disp: , Rfl:  .  nadolol (CORGARD) 20 MG tablet, TAKE 1 TABLET BY MOUTH DAILY, Disp: 90 tablet, Rfl: 3 .  warfarin (COUMADIN) 3 MG tablet, TAKE ONE TABLET EVERY DAY, Disp: 30 tablet, Rfl: 6 .  warfarin (COUMADIN) 5 MG tablet, Take 5 mg by mouth daily., Disp: , Rfl:    No Known Allergies  ROS Review of Systems  Constitutional: Negative.  Negative for diaphoresis, fever and malaise/fatigue.  HENT: Negative.   Eyes: Negative.   Respiratory: Negative.  Negative for cough.   Cardiovascular: Positive for palpitations. Negative for chest pain and near-syncope.  Gastrointestinal: Negative.  Negative for nausea and vomiting.  Endocrine: Negative.   Genitourinary: Negative.   Musculoskeletal: Negative.   Skin: Negative.   Allergic/Immunologic: Negative.   Neurological: Negative.  Negative for dizziness.  Hematological: Negative.   Psychiatric/Behavioral: Negative.  The patient is not nervous/anxious.   All other systems reviewed and are negative.     Objective:    Physical Exam Vitals reviewed.  Constitutional:      Appearance: Normal appearance.  HENT:     Mouth/Throat:     Mouth: Mucous membranes are moist.  Eyes:     Pupils: Pupils are equal, round, and reactive to light.  Neck:     Vascular: No carotid bruit.  Cardiovascular:     Rate  and Rhythm: Normal rate and regular rhythm.     Pulses: Normal pulses.     Heart sounds: Normal heart sounds.  Pulmonary:     Effort: Pulmonary effort is normal.     Breath sounds: Normal breath sounds.  Abdominal:     General: Bowel sounds are normal.     Palpations: Abdomen is soft. There is no hepatomegaly, splenomegaly or mass.     Tenderness: There is no abdominal tenderness.     Hernia: No hernia is present.  Musculoskeletal:     Cervical back: Neck supple.     Right lower leg: No edema.  Left lower leg: No edema.  Skin:    Findings: No rash.  Neurological:     Mental Status: He is alert and oriented to person, place, and time.     Motor: No weakness.  Psychiatric:        Mood and Affect: Mood normal.        Behavior: Behavior normal.     BP (!) 151/86   Pulse 82   Ht 6\' 2"  (1.88 m)   Wt 187 lb 8 oz (85 kg)   BMI 24.07 kg/m  Wt Readings from Last 3 Encounters:  11/16/19 187 lb 8 oz (85 kg)  11/09/19 191 lb 9.6 oz (86.9 kg)  10/10/19 189 lb (85.7 kg)     Health Maintenance Due  Topic Date Due  . COVID-19 Vaccine (1) Never done  . TETANUS/TDAP  Never done  . PNA vac Low Risk Adult (1 of 2 - PCV13) Never done    There are no preventive care reminders to display for this patient.  No results found for: TSH Lab Results  Component Value Date   WBC 8.5 12/23/2017   HGB 15.2 12/23/2017   HCT 47.2 12/23/2017   MCV 94.2 12/23/2017   PLT 200 12/23/2017   Lab Results  Component Value Date   NA 141 12/23/2017   K 4.8 12/23/2017   CO2 28 12/23/2017   GLUCOSE 96 12/23/2017   BUN 17 12/23/2017   CREATININE 1.25 (H) 12/23/2017   BILITOT 1.4 (H) 10/08/2014   ALKPHOS 52 10/08/2014   AST 30 10/08/2014   ALT 17 10/08/2014   PROT 5.9 (L) 10/08/2014   ALBUMIN 3.5 10/08/2014   CALCIUM 8.9 12/23/2017   ANIONGAP 6 12/23/2017   Lab Results  Component Value Date   CHOL 159 10/12/2012   Lab Results  Component Value Date   HDL 59 10/12/2012   Lab Results    Component Value Date   LDLCALC 83 10/12/2012   Lab Results  Component Value Date   TRIG 87 10/12/2012   Lab Results  Component Value Date   CHOLHDL 2.7 10/12/2012   No results found for: HGBA1C    Assessment & Plan:   Problem List Items Addressed This Visit      Cardiovascular and Mediastinum   Atrial fibrillation (HCC) - Primary    EKG shows pacemaker rhythm.  No acute changes were noted.      Relevant Orders   EKG 12-Lead   CBC with Differential/Platelet   COMPLETE METABOLIC PANEL WITH GFR   CK (Creatine Kinase)   Essential hypertension    Blood pressure is under control.      Relevant Orders   CBC with Differential/Platelet   COMPLETE METABOLIC PANEL WITH GFR   CK (Creatine Kinase)     Endocrine   Hypothyroidism    Patient takes his levothyroxine on a regular basis.      Relevant Orders   CBC with Differential/Platelet   COMPLETE METABOLIC PANEL WITH GFR   CK (Creatine Kinase)     Other   Palpitations    Patient did not have any recurrence of skipped beat.      Relevant Orders   CBC with Differential/Platelet   COMPLETE METABOLIC PANEL WITH GFR   CK (Creatine Kinase)   Pacemaker   Relevant Orders   CBC with Differential/Platelet   COMPLETE METABOLIC PANEL WITH GFR   CK (Creatine Kinase)      No orders of the defined types were placed in this encounter.  Report of the EKG.  EKG shows pacemaker rhythm #2 left bundle block #3 no acute changes were noted atypical EKG electronic ventricular pacemaker working well Follow-up: No follow-ups on file.    Corky DownsJaved Damire Remedios, MD

## 2019-11-16 NOTE — Assessment & Plan Note (Signed)
EKG shows pacemaker rhythm.  No acute changes were noted.

## 2019-11-16 NOTE — Assessment & Plan Note (Signed)
Patient did not have any recurrence of skipped beat.

## 2019-11-16 NOTE — Assessment & Plan Note (Signed)
Blood pressure is under control 

## 2019-11-17 LAB — CBC WITH DIFFERENTIAL/PLATELET
Absolute Monocytes: 571 cells/uL (ref 200–950)
Basophils Absolute: 48 cells/uL (ref 0–200)
Basophils Relative: 0.7 %
Eosinophils Absolute: 122 cells/uL (ref 15–500)
Eosinophils Relative: 1.8 %
HCT: 46.9 % (ref 38.5–50.0)
Hemoglobin: 15.3 g/dL (ref 13.2–17.1)
Lymphs Abs: 1244 cells/uL (ref 850–3900)
MCH: 29.7 pg (ref 27.0–33.0)
MCHC: 32.6 g/dL (ref 32.0–36.0)
MCV: 91.1 fL (ref 80.0–100.0)
MPV: 10.4 fL (ref 7.5–12.5)
Monocytes Relative: 8.4 %
Neutro Abs: 4814 cells/uL (ref 1500–7800)
Neutrophils Relative %: 70.8 %
Platelets: 168 10*3/uL (ref 140–400)
RBC: 5.15 10*6/uL (ref 4.20–5.80)
RDW: 13.3 % (ref 11.0–15.0)
Total Lymphocyte: 18.3 %
WBC: 6.8 10*3/uL (ref 3.8–10.8)

## 2019-11-17 LAB — COMPLETE METABOLIC PANEL WITH GFR
AG Ratio: 1.8 (calc) (ref 1.0–2.5)
ALT: 35 U/L (ref 9–46)
AST: 40 U/L — ABNORMAL HIGH (ref 10–35)
Albumin: 4 g/dL (ref 3.6–5.1)
Alkaline phosphatase (APISO): 84 U/L (ref 35–144)
BUN/Creatinine Ratio: 12 (calc) (ref 6–22)
BUN: 19 mg/dL (ref 7–25)
CO2: 26 mmol/L (ref 20–32)
Calcium: 9.2 mg/dL (ref 8.6–10.3)
Chloride: 106 mmol/L (ref 98–110)
Creat: 1.56 mg/dL — ABNORMAL HIGH (ref 0.70–1.11)
GFR, Est African American: 44 mL/min/{1.73_m2} — ABNORMAL LOW (ref >=60)
GFR, Est Non African American: 38 mL/min/{1.73_m2} — ABNORMAL LOW (ref >=60)
Globulin: 2.2 g/dL (calc) (ref 1.9–3.7)
Glucose, Bld: 72 mg/dL (ref 65–99)
Potassium: 4.2 mmol/L (ref 3.5–5.3)
Sodium: 139 mmol/L (ref 135–146)
Total Bilirubin: 1.8 mg/dL — ABNORMAL HIGH (ref 0.2–1.2)
Total Protein: 6.2 g/dL (ref 6.1–8.1)

## 2019-11-17 LAB — CK: Total CK: 94 U/L (ref 44–196)

## 2019-11-21 ENCOUNTER — Other Ambulatory Visit: Payer: Self-pay

## 2019-11-21 ENCOUNTER — Other Ambulatory Visit (INDEPENDENT_AMBULATORY_CARE_PROVIDER_SITE_OTHER): Payer: Medicare PPO

## 2019-11-21 DIAGNOSIS — Z23 Encounter for immunization: Secondary | ICD-10-CM

## 2019-11-30 ENCOUNTER — Ambulatory Visit: Payer: Medicare PPO | Admitting: Internal Medicine

## 2019-12-01 ENCOUNTER — Ambulatory Visit (INDEPENDENT_AMBULATORY_CARE_PROVIDER_SITE_OTHER): Payer: Medicare PPO | Admitting: Internal Medicine

## 2019-12-01 ENCOUNTER — Other Ambulatory Visit: Payer: Self-pay

## 2019-12-01 VITALS — BP 122/80 | HR 90

## 2019-12-01 DIAGNOSIS — E063 Autoimmune thyroiditis: Secondary | ICD-10-CM

## 2019-12-01 DIAGNOSIS — R2681 Unsteadiness on feet: Secondary | ICD-10-CM

## 2019-12-01 DIAGNOSIS — E038 Other specified hypothyroidism: Secondary | ICD-10-CM

## 2019-12-01 DIAGNOSIS — Z950 Presence of cardiac pacemaker: Secondary | ICD-10-CM

## 2019-12-01 DIAGNOSIS — I495 Sick sinus syndrome: Secondary | ICD-10-CM | POA: Diagnosis not present

## 2019-12-12 ENCOUNTER — Ambulatory Visit (INDEPENDENT_AMBULATORY_CARE_PROVIDER_SITE_OTHER): Payer: Medicare PPO | Admitting: Internal Medicine

## 2019-12-12 ENCOUNTER — Other Ambulatory Visit: Payer: Self-pay

## 2019-12-12 ENCOUNTER — Encounter: Payer: Self-pay | Admitting: Internal Medicine

## 2019-12-12 VITALS — BP 140/70 | HR 72 | Ht 74.0 in | Wt 184.7 lb

## 2019-12-12 DIAGNOSIS — I4891 Unspecified atrial fibrillation: Secondary | ICD-10-CM | POA: Diagnosis not present

## 2019-12-12 DIAGNOSIS — E039 Hypothyroidism, unspecified: Secondary | ICD-10-CM | POA: Diagnosis not present

## 2019-12-12 DIAGNOSIS — I495 Sick sinus syndrome: Secondary | ICD-10-CM | POA: Diagnosis not present

## 2019-12-12 DIAGNOSIS — I10 Essential (primary) hypertension: Secondary | ICD-10-CM

## 2019-12-12 DIAGNOSIS — R2681 Unsteadiness on feet: Secondary | ICD-10-CM

## 2019-12-12 LAB — POCT INR: INR: 2.4 (ref 2.0–3.0)

## 2019-12-12 NOTE — Progress Notes (Signed)
Established Patient Office Visit  Subjective:  Patient ID: Johnny Norman, male    DOB: April 23, 1925  Age: 86 y.o. MRN: [id number]  CC:  Chief Complaint  Patient presents with  . Atrial Fibrillation    HPI  Johnny Norman presents for check up. Patient is on Coumadin and want to Check his INR, is also known to have an atrial fibrillation and pacemaker.  Past Medical History:  Diagnosis Date  . A-fib (HCC)   . CHF (congestive heart failure) (HCC)   . Claudication (HCC)    leg  . Hyperlipidemia   . Peripheral vascular disease (HCC)   . Presence of permanent cardiac pacemaker   . Small bowel obstruction (HCC)   . Thyroid disease     Past Surgical History:  Procedure Laterality Date  . APPENDECTOMY    . BOWEL RESECTION  10/12/2014   Procedure: SMALL BOWEL RESECTION;  Surgeon: Gladis Riffle, MD;  Location: [facility];  Service: General;;  . CHOLECYSTECTOMY    . COLONOSCOPY    . INSERT / REPLACE / REMOVE PACEMAKER  09/13/2008   SSS s/p pacemaker implant; Medtronic pacemaker,.  . LAPAROTOMY N/A 10/12/2014   Procedure: EXPLORATORY LAPAROTOMY;  Surgeon: Gladis Riffle, MD;  Location: [facility];  Service: General;  Laterality: N/A;  . PACEMAKER INSERTION N/A 12/30/2017   Procedure: GENERATOR CHANGE OUT- DUAL CHAMBER;  Surgeon: Corky Downs, MD;  Location: [facility];  Service: Cardiovascular;  Laterality: N/A;  . PROSTATE SURGERY      Family History  Family history unknown: Yes    Social History   Socioeconomic History  . Marital status: Unknown    Spouse name: Not on file  . Number of children: Not on file  . Years of education: Not on file  . Highest education level: Not on file  Occupational History  . Not on file  Tobacco Use  . Smoking status: Never Smoker  . Smokeless tobacco: Never Used  Vaping Use  . Vaping Use: Never used  Substance and Sexual Activity  . Alcohol use: No    Comment: occasional  . Drug use: No  . Sexual activity: Yes    Birth  control/protection: None  Other Topics Concern  . Not on file  Social History Narrative  . Not on file   Social Determinants of Health   Financial Resource Strain:   . Difficulty of Paying Living Expenses: Not on file  Food Insecurity:   . Worried About Programme researcher, broadcasting/film/video in the Last Year: Not on file  . Ran Out of Food in the Last Year: Not on file  Transportation Needs:   . Lack of Transportation (Medical): Not on file  . Lack of Transportation (Non-Medical): Not on file  Physical Activity:   . Days of Exercise per Week: Not on file  . Minutes of Exercise per Session: Not on file  Stress:   . Feeling of Stress : Not on file  Social Connections:   . Frequency of Communication with Friends and Family: Not on file  . Frequency of Social Gatherings with Friends and Family: Not on file  . Attends Religious Services: Not on file  . Active Member of Clubs or Organizations: Not on file  . Attends Banker Meetings: Not on file  . Marital Status: Not on file  Intimate Partner Violence:   . Fear of Current or Ex-Partner: Not on file  . Emotionally Abused: Not on file  . Physically Abused: Not  on file  . Sexually Abused: Not on file     Current Outpatient Medications:  .  atorvastatin (LIPITOR) 10 MG tablet, TAKE ONE TABLET BY MOUTH EVERY DAY, Disp: 90 tablet, Rfl: 3 .  Cholecalciferol (VITAMIN D) 50 MCG (2000 UT) CAPS, Take 2,000 Units by mouth daily. , Disp: , Rfl:  .  furosemide (LASIX) 20 MG tablet, Take 1 tablet (20 mg total) by mouth daily., Disp: 30 tablet, Rfl: 3 .  levothyroxine (SYNTHROID) 88 MCG tablet, TAKE 1 TABLET EVERY DAY ON EMPTY STOMACHWITH A GLASS OF WATER AT LEAST 30-60 MINBEFORE BREAKFAST, Disp: 90 tablet, Rfl: 3 .  lisinopril (ZESTRIL) 2.5 MG tablet, TAKE ONE TABLET EVERY DAY, Disp: 30 tablet, Rfl: 6 .  Multiple Vitamin (MULTIVITAMIN) tablet, Take 1 tablet by mouth daily., Disp: , Rfl:  .  nadolol (CORGARD) 20 MG tablet, TAKE 1 TABLET BY MOUTH  DAILY, Disp: 90 tablet, Rfl: 3 .  warfarin (COUMADIN) 3 MG tablet, TAKE ONE TABLET EVERY DAY, Disp: 30 tablet, Rfl: 6 .  warfarin (COUMADIN) 5 MG tablet, Take 5 mg by mouth daily., Disp: , Rfl:    No Known Allergies  ROS Review of Systems  Constitutional: Negative.   HENT: Negative.   Eyes: Negative.   Respiratory: Negative.   Cardiovascular: Negative.   Gastrointestinal: Negative.   Endocrine: Negative.   Genitourinary: Negative.   Musculoskeletal: Negative.   Skin: Negative.   Allergic/Immunologic: Negative.   Neurological: Negative.   Hematological: Negative.   Psychiatric/Behavioral: Negative.   All other systems reviewed and are negative.     Objective:    Physical Exam Vitals reviewed.  Constitutional:      Appearance: Normal appearance.  HENT:     Mouth/Throat:     Mouth: Mucous membranes are moist.  Eyes:     Pupils: Pupils are equal, round, and reactive to light.  Neck:     Vascular: No carotid bruit.  Cardiovascular:     Rate and Rhythm: Normal rate and regular rhythm.     Pulses: Normal pulses.     Heart sounds: Normal heart sounds.  Pulmonary:     Effort: Pulmonary effort is normal.     Breath sounds: Normal breath sounds.  Abdominal:     General: Bowel sounds are normal.     Palpations: Abdomen is soft. There is no hepatomegaly, splenomegaly or mass.     Tenderness: There is no abdominal tenderness.     Hernia: No hernia is present.  Musculoskeletal:     Cervical back: Neck supple.     Right lower leg: No edema.     Left lower leg: No edema.  Skin:    Findings: No rash.  Neurological:     Mental Status: He is alert and oriented to person, place, and time.     Motor: No weakness.  Psychiatric:        Mood and Affect: Mood normal.        Behavior: Behavior normal.     BP 140/70   Pulse 72   Ht 6\' 2"  (1.88 m)   Wt 184 lb 11.2 oz (83.8 kg)   BMI 23.71 kg/m  Wt Readings from Last 3 Encounters:  12/12/19 184 lb 11.2 oz (83.8 kg)    11/16/19 187 lb 8 oz (85 kg)  11/09/19 191 lb 9.6 oz (86.9 kg)     Health Maintenance Due  Topic Date Due  . TETANUS/TDAP  Never done  . PNA vac Low Risk Adult (1 of  2 - PCV13) Never done  . COVID-19 Vaccine (2 - Pfizer 2-dose series) 12/12/2019    There are no preventive care reminders to display for this patient.  No results found for: TSH Lab Results  Component Value Date   WBC 6.8 11/16/2019   HGB 15.3 11/16/2019   HCT 46.9 11/16/2019   MCV 91.1 11/16/2019   PLT 168 11/16/2019   Lab Results  Component Value Date   NA 139 11/16/2019   K 4.2 11/16/2019   CO2 26 11/16/2019   GLUCOSE 72 11/16/2019   BUN 19 11/16/2019   CREATININE 1.56 (H) 11/16/2019   BILITOT 1.8 (H) 11/16/2019   ALKPHOS 52 10/08/2014   AST 40 (H) 11/16/2019   ALT 35 11/16/2019   PROT 6.2 11/16/2019   ALBUMIN 3.5 10/08/2014   CALCIUM 9.2 11/16/2019   ANIONGAP 6 12/23/2017   Lab Results  Component Value Date   CHOL 159 10/12/2012   Lab Results  Component Value Date   HDL 59 10/12/2012   Lab Results  Component Value Date   LDLCALC 83 10/12/2012   Lab Results  Component Value Date   TRIG 87 10/12/2012   Lab Results  Component Value Date   CHOLHDL 2.7 10/12/2012   No results found for: HGBA1C    Assessment & Plan:   Problem List Items Addressed This Visit      Cardiovascular and Mediastinum   Atrial fibrillation (HCC) - Primary   Relevant Orders   POCT INR (Completed)   Sick sinus syndrome (HCC)    Patient does not give any episode of tachycardia.  No history of syncope.  No chest pain.  No history of hypotension.      Essential hypertension    Blood pressure is under control without any evidence of postural hypotension.        Endocrine   Hypothyroidism    Patient take his medicine for hypothyroidism.  It is 88 mcg of levothyroxine daily        Other   Unsteady gait    Patient was advised to use a stick to walk.         No orders of the defined types were  placed in this encounter.   Follow-up: No follow-ups on file.    Corky Downs, MD

## 2019-12-18 ENCOUNTER — Encounter: Payer: Self-pay | Admitting: Internal Medicine

## 2019-12-18 NOTE — Assessment & Plan Note (Signed)
Patient take his medicine for hypothyroidism.  It is 88 mcg of levothyroxine daily

## 2019-12-18 NOTE — Assessment & Plan Note (Signed)
Blood pressure is under control without any evidence of postural hypotension.

## 2019-12-18 NOTE — Assessment & Plan Note (Signed)
Patient does not give any episode of tachycardia.  No history of syncope.  No chest pain.  No history of hypotension.

## 2019-12-18 NOTE — Assessment & Plan Note (Signed)
Patient was advised to use a stick to walk 

## 2019-12-27 NOTE — Assessment & Plan Note (Signed)
Pacemaker is functioning well. 

## 2019-12-27 NOTE — Assessment & Plan Note (Signed)
Patient is in chronic atrial fibrillation is on chronic anticoagulation.  Pacemaker functioning well, battery life and impedances normal. he will come back in 3 months for recheck.

## 2019-12-27 NOTE — Assessment & Plan Note (Signed)
Gait is stable 

## 2019-12-27 NOTE — Assessment & Plan Note (Signed)
Patient is taking his medicine regularly.

## 2019-12-27 NOTE — Progress Notes (Signed)
Established Patient Office Visit  Subjective:  Patient ID: Johnny Norman, male    DOB: Sep 18, 1925  Age: 86 y.o. MRN: [id number]  CC:  Chief Complaint  Patient presents with  . Pacemaker Check    HPI  Johnny Norman presents forgeneral  And pacer check  Past Medical History:  Diagnosis Date  . A-fib (HCC)   . CHF (congestive heart failure) (HCC)   . Claudication (HCC)    leg  . Hyperlipidemia   . Peripheral vascular disease (HCC)   . Presence of permanent cardiac pacemaker   . Small bowel obstruction (HCC)   . Thyroid disease     Past Surgical History:  Procedure Laterality Date  . APPENDECTOMY    . BOWEL RESECTION  10/12/2014   Procedure: SMALL BOWEL RESECTION;  Surgeon: Gladis Riffle, MD;  Location: [facility] ORS;  Service: General;;  . CHOLECYSTECTOMY    . COLONOSCOPY    . INSERT / REPLACE / REMOVE PACEMAKER  09/13/2008   SSS s/p pacemaker implant; Medtronic pacemaker,.  . LAPAROTOMY N/A 10/12/2014   Procedure: EXPLORATORY LAPAROTOMY;  Surgeon: Gladis Riffle, MD;  Location: [facility];  Service: General;  Laterality: N/A;  . PACEMAKER INSERTION N/A 12/30/2017   Procedure: GENERATOR CHANGE OUT- DUAL CHAMBER;  Surgeon: Corky Downs, MD;  Location: [facility];  Service: Cardiovascular;  Laterality: N/A;  . PROSTATE SURGERY      Family History  Family history unknown: Yes    Social History   Socioeconomic History  . Marital status: Unknown    Spouse name: Not on file  . Number of children: Not on file  . Years of education: Not on file  . Highest education level: Not on file  Occupational History  . Not on file  Tobacco Use  . Smoking status: Never Smoker  . Smokeless tobacco: Never Used  Vaping Use  . Vaping Use: Never used  Substance and Sexual Activity  . Alcohol use: No    Comment: occasional  . Drug use: No  . Sexual activity: Yes    Birth control/protection: None  Other Topics Concern  . Not on file  Social History Narrative  . Not on  file   Social Determinants of Health   Financial Resource Strain:   . Difficulty of Paying Living Expenses: Not on file  Food Insecurity:   . Worried About Programme researcher, broadcasting/film/video in the Last Year: Not on file  . Ran Out of Food in the Last Year: Not on file  Transportation Needs:   . Lack of Transportation (Medical): Not on file  . Lack of Transportation (Non-Medical): Not on file  Physical Activity:   . Days of Exercise per Week: Not on file  . Minutes of Exercise per Session: Not on file  Stress:   . Feeling of Stress : Not on file  Social Connections:   . Frequency of Communication with Friends and Family: Not on file  . Frequency of Social Gatherings with Friends and Family: Not on file  . Attends Religious Services: Not on file  . Active Member of Clubs or Organizations: Not on file  . Attends Banker Meetings: Not on file  . Marital Status: Not on file  Intimate Partner Violence:   . Fear of Current or Ex-Partner: Not on file  . Emotionally Abused: Not on file  . Physically Abused: Not on file  . Sexually Abused: Not on file     Current Outpatient Medications:  .  atorvastatin (LIPITOR) 10 MG tablet, TAKE ONE TABLET BY MOUTH EVERY DAY, Disp: 90 tablet, Rfl: 3 .  Cholecalciferol (VITAMIN D) 50 MCG (2000 UT) CAPS, Take 2,000 Units by mouth daily. , Disp: , Rfl:  .  furosemide (LASIX) 20 MG tablet, Take 1 tablet (20 mg total) by mouth daily., Disp: 30 tablet, Rfl: 3 .  levothyroxine (SYNTHROID) 88 MCG tablet, TAKE 1 TABLET EVERY DAY ON EMPTY STOMACHWITH A GLASS OF WATER AT LEAST 30-60 MINBEFORE BREAKFAST, Disp: 90 tablet, Rfl: 3 .  lisinopril (ZESTRIL) 2.5 MG tablet, TAKE ONE TABLET EVERY DAY, Disp: 30 tablet, Rfl: 6 .  Multiple Vitamin (MULTIVITAMIN) tablet, Take 1 tablet by mouth daily., Disp: , Rfl:  .  nadolol (CORGARD) 20 MG tablet, TAKE 1 TABLET BY MOUTH DAILY, Disp: 90 tablet, Rfl: 3 .  warfarin (COUMADIN) 3 MG tablet, TAKE ONE TABLET EVERY DAY, Disp: 30  tablet, Rfl: 6 .  warfarin (COUMADIN) 5 MG tablet, Take 5 mg by mouth daily., Disp: , Rfl:    No Known Allergies  ROS Review of Systems  Constitutional: Negative.   HENT: Negative.   Eyes: Negative.   Respiratory: Negative.   Cardiovascular: Negative.   Gastrointestinal: Negative.   Endocrine: Negative.   Skin: Negative.   Hematological: Negative.   Psychiatric/Behavioral: Negative.   All other systems reviewed and are negative.     Objective:    Physical Exam Vitals reviewed.  Constitutional:      Appearance: Normal appearance.  Neck:     Vascular: No carotid bruit.  Cardiovascular:     Rate and Rhythm: Normal rate and regular rhythm.     Pulses: Normal pulses.     Heart sounds: Normal heart sounds.  Pulmonary:     Effort: Pulmonary effort is normal.     Breath sounds: Normal breath sounds.  Abdominal:     General: Bowel sounds are normal.     Palpations: Abdomen is soft. There is no hepatomegaly, splenomegaly or mass.     Tenderness: There is no abdominal tenderness.     Hernia: No hernia is present.  Musculoskeletal:     Cervical back: Neck supple.     Right lower leg: No edema.     Left lower leg: No edema.  Skin:    Findings: No rash.  Neurological:     Mental Status: He is alert and oriented to person, place, and time.     Motor: No weakness.  Psychiatric:        Mood and Affect: Mood normal.        Behavior: Behavior normal.     BP 122/80   Pulse 90  Wt Readings from Last 3 Encounters:  12/12/19 184 lb 11.2 oz (83.8 kg)  11/16/19 187 lb 8 oz (85 kg)  11/09/19 191 lb 9.6 oz (86.9 kg)     Health Maintenance Due  Topic Date Due  . TETANUS/TDAP  Never done  . PNA vac Low Risk Adult (1 of 2 - PCV13) Never done  . COVID-19 Vaccine (2 - Pfizer 2-dose series) 12/12/2019    There are no preventive care reminders to display for this patient.  No results found for: TSH Lab Results  Component Value Date   WBC 6.8 11/16/2019   HGB 15.3  11/16/2019   HCT 46.9 11/16/2019   MCV 91.1 11/16/2019   PLT 168 11/16/2019   Lab Results  Component Value Date   NA 139 11/16/2019   K 4.2 11/16/2019   CO2 26  11/16/2019   GLUCOSE 72 11/16/2019   BUN 19 11/16/2019   CREATININE 1.56 (H) 11/16/2019   BILITOT 1.8 (H) 11/16/2019   ALKPHOS 52 10/08/2014   AST 40 (H) 11/16/2019   ALT 35 11/16/2019   PROT 6.2 11/16/2019   ALBUMIN 3.5 10/08/2014   CALCIUM 9.2 11/16/2019   ANIONGAP 6 12/23/2017   Lab Results  Component Value Date   CHOL 159 10/12/2012   Lab Results  Component Value Date   HDL 59 10/12/2012   Lab Results  Component Value Date   LDLCALC 83 10/12/2012   Lab Results  Component Value Date   TRIG 87 10/12/2012   Lab Results  Component Value Date   CHOLHDL 2.7 10/12/2012   No results found for: HGBA1C    Assessment & Plan:   Problem List Items Addressed This Visit      Cardiovascular and Mediastinum   Sick sinus syndrome [facility])    Patient is in chronic atrial fibrillation is on chronic anticoagulation.  Pacemaker functioning well, battery life and impedances normal. he will come back in 3 months for recheck.        Endocrine   Hypothyroidism    Patient is taking his medicine regularly.        Other   Presence of permanent cardiac pacemaker    Pacemaker is functioning well      RESOLVED: Unsteady gait    Gait is stable.       Other Visit Diagnoses    Cardiac pacemaker in situ    -  Primary   Relevant Orders   PACEMAKER IN CLINIC CHECK (Completed)      No orders of the defined types were placed in this encounter. IMPRESSION:  Note: Medical Device Follow-up   Patient pacemaker was interrogated by pacemakers analyzer, battery status is okay. No programming changes were indicated after the review of the data. Histogram shows no change since the last interrogation  Atrial and ventricular sensing thresholds were found to be acceptable  Impedance was checked and it was found to be normal.  Thresholds were found to be okay on evaluation of rhythm problem. No high rate or low rate arrhythmia were noted. Estimated battery longevity is More than months. I have personally reviewed the device data and amended the report as necessary.  Report approved and finalized on       Corky Downs, MD

## 2020-01-11 ENCOUNTER — Ambulatory Visit (INDEPENDENT_AMBULATORY_CARE_PROVIDER_SITE_OTHER): Payer: Medicare PPO | Admitting: Internal Medicine

## 2020-01-11 ENCOUNTER — Encounter: Payer: Self-pay | Admitting: Internal Medicine

## 2020-01-11 ENCOUNTER — Other Ambulatory Visit: Payer: Self-pay

## 2020-01-11 VITALS — BP 132/88 | HR 86 | Ht 74.0 in | Wt 185.6 lb

## 2020-01-11 DIAGNOSIS — R2681 Unsteadiness on feet: Secondary | ICD-10-CM | POA: Insufficient documentation

## 2020-01-11 DIAGNOSIS — I10 Essential (primary) hypertension: Secondary | ICD-10-CM

## 2020-01-11 DIAGNOSIS — I4891 Unspecified atrial fibrillation: Secondary | ICD-10-CM | POA: Diagnosis not present

## 2020-01-11 DIAGNOSIS — E038 Other specified hypothyroidism: Secondary | ICD-10-CM | POA: Diagnosis not present

## 2020-01-11 DIAGNOSIS — E063 Autoimmune thyroiditis: Secondary | ICD-10-CM | POA: Diagnosis not present

## 2020-01-11 LAB — POCT INR: INR: 2.2 (ref 2.0–3.0)

## 2020-01-11 NOTE — Assessment & Plan Note (Addendum)
Pro time is therapeutic patient denies any history of dizziness chest pain or shortness of breath on exertion.  Heart isir regular abdomen is soft nontender there is no pedal edema.

## 2020-01-11 NOTE — Progress Notes (Signed)
Established Patient Office Visit  Subjective:  Patient ID: Johnny Norman, male    DOB: [birth year]  Age: 86 y.o. MRN: [id number]  CC:  Chief Complaint  Patient presents with  . atrial fibrilation    HPI  Johnny Norman presents for her pro time check.  He denies any chest pain or shortness of breath denies any history of dizziness or swelling of the legs his blood pressure is stable.  His pro time is therapeutic he was advised to continue taking the same dose of Coumadin on a daily basis.  Past Medical History:  Diagnosis Date  . A-fib (HCC)   . CHF (congestive heart failure) (HCC)   . Claudication (HCC)    leg  . Hyperlipidemia   . Peripheral vascular disease (HCC)   . Presence of permanent cardiac pacemaker   . Small bowel obstruction (HCC)   . Thyroid disease     Past Surgical History:  Procedure Laterality Date  . APPENDECTOMY    . BOWEL RESECTION  10/12/2014   Procedure: SMALL BOWEL RESECTION;  Surgeon: Gladis Riffle, MD;  Location: [facility];  Service: General;;  . CHOLECYSTECTOMY    . COLONOSCOPY    . INSERT / REPLACE / REMOVE PACEMAKER  09/13/2008   SSS s/p pacemaker implant; Medtronic pacemaker,.  . LAPAROTOMY N/A 10/12/2014   Procedure: EXPLORATORY LAPAROTOMY;  Surgeon: Gladis Riffle, MD;  Location: [facility];  Service: General;  Laterality: N/A;  . PACEMAKER INSERTION N/A 12/30/2017   Procedure: GENERATOR CHANGE OUT- DUAL CHAMBER;  Surgeon: Corky Downs, MD;  Location: [facility] OR[facility];  Service: Cardiovascular;  Laterality: N/A;  . PROSTATE SURGERY      Family History  Family history unknown: Yes    Social History   Socioeconomic History  . Marital status: Unknown    Spouse name: Not on file  . Number of children: Not on file  . Years of education: Not on file  . Highest education level: Not on file  Occupational History  . Not on file  Tobacco Use  . Smoking status: Never Smoker  . Smokeless tobacco: Never Used  Vaping Use  . Vaping Use:  Never used  Substance and Sexual Activity  . Alcohol use: No    Comment: occasional  . Drug use: No  . Sexual activity: Yes    Birth control/protection: None  Other Topics Concern  . Not on file  Social History Narrative  . Not on file   Social Determinants of Health   Financial Resource Strain: Not on file  Food Insecurity: Not on file  Transportation Needs: Not on file  Physical Activity: Not on file  Stress: Not on file  Social Connections: Not on file  Intimate Partner Violence: Not on file     Current Outpatient Medications:  .  atorvastatin (LIPITOR) 10 MG tablet, TAKE ONE TABLET BY MOUTH EVERY DAY, Disp: 90 tablet, Rfl: 3 .  Cholecalciferol (VITAMIN D) 50 MCG (2000 UT) CAPS, Take 2,000 Units by mouth daily. , Disp: , Rfl:  .  furosemide (LASIX) 20 MG tablet, Take 1 tablet (20 mg total) by mouth daily., Disp: 30 tablet, Rfl: 3 .  levothyroxine (SYNTHROID) 88 MCG tablet, TAKE 1 TABLET EVERY DAY ON EMPTY STOMACHWITH A GLASS OF WATER AT LEAST 30-60 MINBEFORE BREAKFAST, Disp: 90 tablet, Rfl: 3 .  lisinopril (ZESTRIL) 2.5 MG tablet, TAKE ONE TABLET EVERY DAY, Disp: 30 tablet, Rfl: 6 .  Multiple Vitamin (MULTIVITAMIN) tablet, Take 1 tablet by mouth  daily., Disp: , Rfl:  .  nadolol (CORGARD) 20 MG tablet, TAKE 1 TABLET BY MOUTH DAILY, Disp: 90 tablet, Rfl: 3 .  warfarin (COUMADIN) 3 MG tablet, TAKE ONE TABLET EVERY DAY, Disp: 30 tablet, Rfl: 6 .  warfarin (COUMADIN) 5 MG tablet, Take 5 mg by mouth daily., Disp: , Rfl:    No Known Allergies  ROS Review of Systems  Constitutional: Negative for appetite change.  HENT: Negative.   Eyes: Positive for discharge.  Respiratory: Negative.   Cardiovascular: Negative.   Gastrointestinal: Negative.   Neurological: Negative.   Hematological: Negative.   Psychiatric/Behavioral: Negative.       Objective:    Physical Exam Vitals reviewed.  Constitutional:      Appearance: Normal appearance.  HENT:     Mouth/Throat:      Mouth: Mucous membranes are moist.  Eyes:     Pupils: Pupils are equal, round, and reactive to light.  Neck:     Vascular: No carotid bruit.  Cardiovascular:     Rate and Rhythm: Normal rate. Rhythm irregular.     Pulses: Normal pulses.     Heart sounds: Normal heart sounds.  Pulmonary:     Effort: Pulmonary effort is normal.     Breath sounds: Normal breath sounds. No rhonchi.  Abdominal:     General: Bowel sounds are normal.     Palpations: Abdomen is soft. There is no hepatomegaly, splenomegaly or mass.     Tenderness: There is no abdominal tenderness.  Musculoskeletal:        General: No swelling, deformity or signs of injury.     Cervical back: Neck supple.     Right lower leg: No edema.     Left lower leg: No edema.  Skin:    Findings: No rash.  Neurological:     Mental Status: He is alert and oriented to person, place, and time.     Motor: No weakness.  Psychiatric:        Mood and Affect: Mood normal.        Behavior: Behavior normal.     BP 132/88   Pulse 86   Ht 6\' 2"  (1.88 m)   Wt 185 lb 9.6 oz (84.2 kg)   BMI 23.83 kg/m  Wt Readings from Last 3 Encounters:  01/11/20 185 lb 9.6 oz (84.2 kg)  12/12/19 184 lb 11.2 oz (83.8 kg)  11/16/19 187 lb 8 oz (85 kg)     Health Maintenance Due  Topic Date Due  . TETANUS/TDAP  Never done  . PNA vac Low Risk Adult (1 of 2 - PCV13) Never done  . COVID-19 Vaccine (2 - Pfizer 3-dose booster series) 12/12/2019    There are no preventive care reminders to display for this patient.  No results found for: TSH Lab Results  Component Value Date   WBC 6.8 11/16/2019   HGB 15.3 11/16/2019   HCT 46.9 11/16/2019   MCV 91.1 11/16/2019   PLT 168 11/16/2019   Lab Results  Component Value Date   NA 139 11/16/2019   K 4.2 11/16/2019   CO2 26 11/16/2019   GLUCOSE 72 11/16/2019   BUN 19 11/16/2019   CREATININE 1.56 (H) 11/16/2019   BILITOT 1.8 (H) 11/16/2019   ALKPHOS 52 10/08/2014   AST 40 (H) 11/16/2019   ALT 35  11/16/2019   PROT 6.2 11/16/2019   ALBUMIN 3.5 10/08/2014   CALCIUM 9.2 11/16/2019   ANIONGAP 6 12/23/2017   Lab Results  Component Value Date   CHOL 159 10/12/2012   Lab Results  Component Value Date   HDL 59 10/12/2012   Lab Results  Component Value Date   LDLCALC 83 10/12/2012   Lab Results  Component Value Date   TRIG 87 10/12/2012   Lab Results  Component Value Date   CHOLHDL 2.7 10/12/2012   No results found for: HGBA1C    Assessment & Plan:   Problem List Items Addressed This Visit      Cardiovascular and Mediastinum   Atrial fibrillation (HCC) - Primary    Pro time is therapeutic patient denies any history of dizziness chest pain or shortness of breath on exertion.  Heart isir regular abdomen is soft nontender there is no pedal edema.      Relevant Orders   POCT INR (Completed)   Essential hypertension    Blood pressure is stable on today's examination.  He is taking his medicine regularly.  In the office the blood pressure was checked to be 132/88        Endocrine   Hypothyroidism    Patient is compliant with her medication for hypothyroidism.        Other   Unsteady gait    Patient gait is steady today.  He has been drinking enough water he does not get dizzy.         No orders of the defined types were placed in this encounter.   Follow-up: No follow-ups on file.    Corky Downs, MD

## 2020-01-11 NOTE — Assessment & Plan Note (Signed)
Blood pressure is stable on today's examination.  He is taking his medicine regularly.  In the office the blood pressure was checked to be 132/88

## 2020-01-11 NOTE — Assessment & Plan Note (Signed)
Patient is compliant with her medication for hypothyroidism.

## 2020-01-11 NOTE — Assessment & Plan Note (Signed)
Patient gait is steady today.  He has been drinking enough water he does not get dizzy.

## 2020-02-13 ENCOUNTER — Ambulatory Visit: Payer: Medicare PPO | Admitting: Internal Medicine

## 2020-02-15 ENCOUNTER — Ambulatory Visit (INDEPENDENT_AMBULATORY_CARE_PROVIDER_SITE_OTHER): Payer: Medicare PPO | Admitting: Internal Medicine

## 2020-02-15 ENCOUNTER — Other Ambulatory Visit: Payer: Self-pay

## 2020-02-15 ENCOUNTER — Encounter: Payer: Self-pay | Admitting: Internal Medicine

## 2020-02-15 DIAGNOSIS — I4891 Unspecified atrial fibrillation: Secondary | ICD-10-CM | POA: Diagnosis not present

## 2020-02-15 DIAGNOSIS — R002 Palpitations: Secondary | ICD-10-CM | POA: Diagnosis not present

## 2020-02-15 DIAGNOSIS — I10 Essential (primary) hypertension: Secondary | ICD-10-CM

## 2020-02-15 DIAGNOSIS — R600 Localized edema: Secondary | ICD-10-CM

## 2020-02-15 LAB — POCT INR: INR: 2.2 (ref 2.0–3.0)

## 2020-02-15 NOTE — Assessment & Plan Note (Signed)
.  -   I encouraged the patient to eat a low-sodium diet to help control blood pressure. - I encouraged the patient to live an active lifestyle and complete activities that increases heart rate to 85% target heart rate at least 5 times per week for one hour.     

## 2020-02-15 NOTE — Assessment & Plan Note (Signed)
Patient has 1+ pedal edema of the both leg patient was advised to continue taking Lasix 20 mg p.o. daily. And follow low-salt diet.

## 2020-02-15 NOTE — Assessment & Plan Note (Signed)
Atrial fibrillation is well controlled on the present therapeutic regimen. Patient was advised to follow low-salt diet. Pro time is therapeutic.

## 2020-02-15 NOTE — Progress Notes (Signed)
Established Patient Office Visit  Subjective:  Patient ID: Johnny Norman, male    DOB: [birth year]  Age: 87 y.o. MRN: [id number]  CC:  Chief Complaint  Patient presents with  . Atrial Fibrillation    HPI  Forrester E Gropp presents for atrial fib / pro time check, patient denies any chest pain he has swelling of the both legs 1+ pedal edema, takes Lasix 20 mg p.o. daily he was advised to follow low-salt diet.  Past Medical History:  Diagnosis Date  . A-fib (HCC)   . CHF (congestive heart failure) (HCC)   . Claudication (HCC)    leg  . Hyperlipidemia   . Peripheral vascular disease (HCC)   . Presence of permanent cardiac pacemaker   . Small bowel obstruction (HCC)   . Thyroid disease     Past Surgical History:  Procedure Laterality Date  . APPENDECTOMY    . BOWEL RESECTION  10/12/2014   Procedure: SMALL BOWEL RESECTION;  Surgeon: Gladis Riffle, MD;  Location: [facility]S;  Service: General;;  . CHOLECYSTECTOMY    . COLONOSCOPY    . INSERT / REPLACE / REMOVE PACEMAKER  09/13/2008   SSS s/p pacemaker implant; [employer]tronic pacemaker,.  . LAPAROTOMY N/A 10/12/2014   Procedure: EXPLORATORY LAPAROTOMY;  Surgeon: Gladis Riffle, MD;  Location: [facility];  Service: General;  Laterality: N/A;  . PACEMAKER INSERTION N/A 12/30/2017   Procedure: GENERATOR CHANGE OUT- DUAL CHAMBER;  Surgeon: Corky Downs, MD;  Location: [facility] ORS;  Service: Cardiovascular;  Laterality: N/A;  . PROSTATE SURGERY      Family History  Family history unknown: Yes    Social History   Socioeconomic History  . Marital status: Unknown    Spouse name: Not on file  . Number of children: Not on file  . Years of education: Not on file  . Highest education level: Not on file  Occupational History  . Not on file  Tobacco Use  . Smoking status: Never Smoker  . Smokeless tobacco: Never Used  Vaping Use  . Vaping Use: Never used  Substance and Sexual Activity  . Alcohol use: No    Comment:  occasional  . Drug use: No  . Sexual activity: Yes    Birth control/protection: None  Other Topics Concern  . Not on file  Social History Narrative  . Not on file   Social Determinants of Health   Financial Resource Strain: Not on file  Food Insecurity: Not on file  Transportation Needs: Not on file  Physical Activity: Not on file  Stress: Not on file  Social Connections: Not on file  Intimate Partner Violence: Not on file     Current Outpatient Medications:  .  atorvastatin (LIPITOR) 10 MG tablet, TAKE ONE TABLET BY MOUTH EVERY DAY, Disp: 90 tablet, Rfl: 3 .  Cholecalciferol (VITAMIN D) 50 MCG (2000 UT) CAPS, Take 2,000 Units by mouth daily. , Disp: , Rfl:  .  furosemide (LASIX) 20 MG tablet, Take 1 tablet (20 mg total) by mouth daily., Disp: 30 tablet, Rfl: 3 .  levothyroxine (SYNTHROID) 88 MCG tablet, TAKE 1 TABLET EVERY DAY ON EMPTY STOMACHWITH A GLASS OF WATER AT LEAST 30-60 MINBEFORE BREAKFAST, Disp: 90 tablet, Rfl: 3 .  lisinopril (ZESTRIL) 2.5 MG tablet, TAKE ONE TABLET EVERY DAY, Disp: 30 tablet, Rfl: 6 .  Multiple Vitamin (MULTIVITAMIN) tablet, Take 1 tablet by mouth daily., Disp: , Rfl:  .  nadolol (CORGARD) 20 MG tablet, TAKE 1 TABLET BY  MOUTH DAILY, Disp: 90 tablet, Rfl: 3 .  warfarin (COUMADIN) 3 MG tablet, TAKE ONE TABLET EVERY DAY, Disp: 30 tablet, Rfl: 6 .  warfarin (COUMADIN) 5 MG tablet, Take 5 mg by mouth daily., Disp: , Rfl:    No Known Allergies  ROS Review of Systems  Constitutional: Negative.   HENT: Negative.   Eyes: Negative.   Respiratory: Negative.   Cardiovascular: Negative.   Gastrointestinal: Negative.   Endocrine: Negative.   Genitourinary: Negative.   Musculoskeletal: Negative.   Skin: Negative.   Allergic/Immunologic: Negative.   Neurological: Negative.   Hematological: Negative.   Psychiatric/Behavioral: Negative.   All other systems reviewed and are negative.     Objective:    Physical Exam Vitals reviewed.  Constitutional:       Appearance: Normal appearance.  HENT:     Mouth/Throat:     Mouth: Mucous membranes are moist.  Eyes:     Pupils: Pupils are equal, round, and reactive to light.  Neck:     Vascular: No carotid bruit.  Cardiovascular:     Rate and Rhythm: Normal rate and regular rhythm.     Pulses: Normal pulses.     Heart sounds: Normal heart sounds.  Pulmonary:     Effort: Pulmonary effort is normal.     Breath sounds: Normal breath sounds.  Abdominal:     General: Bowel sounds are normal.     Palpations: Abdomen is soft. There is no hepatomegaly, splenomegaly or mass.     Tenderness: There is no abdominal tenderness.     Hernia: No hernia is present.  Musculoskeletal:     Cervical back: Neck supple.     Right lower leg: No edema.     Left lower leg: No edema.  Skin:    Findings: No rash.  Neurological:     Mental Status: He is alert and oriented to person, place, and time.     Motor: No weakness.  Psychiatric:        Mood and Affect: Mood normal.        Behavior: Behavior normal.     There were no vitals taken for this visit. Wt Readings from Last 3 Encounters:  01/11/20 185 lb 9.6 oz (84.2 kg)  12/12/19 184 lb 11.2 oz (83.8 kg)  11/16/19 187 lb 8 oz (85 kg)     Health Maintenance Due  Topic Date Due  . TETANUS/TDAP  Never done  . PNA vac Low Risk Adult (1 of 2 - PCV13) Never done  . COVID-19 Vaccine (2 - Pfizer 3-dose series) 12/12/2019    There are no preventive care reminders to display for this patient.  No results found for: TSH Lab Results  Component Value Date   WBC 6.8 11/16/2019   HGB 15.3 11/16/2019   HCT 46.9 11/16/2019   MCV 91.1 11/16/2019   PLT 168 11/16/2019   Lab Results  Component Value Date   NA 139 11/16/2019   K 4.2 11/16/2019   CO2 26 11/16/2019   GLUCOSE 72 11/16/2019   BUN 19 11/16/2019   CREATININE 1.56 (H) 11/16/2019   BILITOT 1.8 (H) 11/16/2019   ALKPHOS 52 10/08/2014   AST 40 (H) 11/16/2019   ALT 35 11/16/2019   PROT 6.2  11/16/2019   ALBUMIN 3.5 10/08/2014   CALCIUM 9.2 11/16/2019   ANIONGAP 6 12/23/2017   Lab Results  Component Value Date   CHOL 159 10/12/2012   Lab Results  Component Value Date   HDL 59  10/12/2012   Lab Results  Component Value Date   LDLCALC 83 10/12/2012   Lab Results  Component Value Date   TRIG 87 10/12/2012   Lab Results  Component Value Date   CHOLHDL 2.7 10/12/2012   No results found for: HGBA1C    Assessment & Plan:   Problem List Items Addressed This Visit      Cardiovascular and Mediastinum   Atrial fibrillation (HCC) - Primary    Atrial fibrillation is well controlled on the present therapeutic regimen. Patient was advised to follow low-salt diet. Pro time is therapeutic.      Relevant Orders   POCT INR (Completed)   Essential hypertension    -.  - I encouraged the patient to eat a low-sodium diet to help control blood pressure. - I encouraged the patient to live an active lifestyle and complete activities that increases heart rate to 85% target heart rate at least 5 times per week for one hour.            Other   Bilateral leg edema    Patient has 1+ pedal edema of the both leg patient was advised to continue taking Lasix 20 mg p.o. daily. And follow low-salt diet.      Palpitations    Palpitation is under control pacemaker is working well it is being checked every 3 months.         No orders of the defined types were placed in this encounter.   Follow-up: No follow-ups on file.    Corky Downs, MD

## 2020-02-15 NOTE — Assessment & Plan Note (Signed)
Palpitation is under control pacemaker is working well it is being checked every 3 months.

## 2020-03-05 ENCOUNTER — Other Ambulatory Visit: Payer: Self-pay

## 2020-03-05 ENCOUNTER — Ambulatory Visit (INDEPENDENT_AMBULATORY_CARE_PROVIDER_SITE_OTHER): Payer: Medicare PPO | Admitting: Internal Medicine

## 2020-03-05 VITALS — BP 115/74 | HR 90

## 2020-03-05 DIAGNOSIS — I10 Essential (primary) hypertension: Secondary | ICD-10-CM

## 2020-03-05 DIAGNOSIS — E038 Other specified hypothyroidism: Secondary | ICD-10-CM

## 2020-03-05 DIAGNOSIS — I4891 Unspecified atrial fibrillation: Secondary | ICD-10-CM | POA: Diagnosis not present

## 2020-03-05 DIAGNOSIS — Z950 Presence of cardiac pacemaker: Secondary | ICD-10-CM | POA: Diagnosis not present

## 2020-03-05 DIAGNOSIS — E063 Autoimmune thyroiditis: Secondary | ICD-10-CM | POA: Diagnosis not present

## 2020-03-05 NOTE — Assessment & Plan Note (Signed)
.  -   I encouraged the patient to eat a low-sodium diet to help control blood pressure. - I encouraged the patient to live an active lifestyle and complete activities that increases heart rate to 85% target heart rate at least 5 times per week for one hour.     

## 2020-03-05 NOTE — Progress Notes (Signed)
Established Patient Office Visit  Subjective:  Patient ID: Johnny Norman, male    DOB: 05-Feb-1925  Age: 87 y.o. MRN: [id number]  CC: No chief complaint on file.   HPI  Johnny Norman presents forpacer check  Past Medical History:  Diagnosis Date  . A-fib (HCC)   . CHF (congestive heart failure) (HCC)   . Claudication (HCC)    leg  . Hyperlipidemia   . Peripheral vascular disease (HCC)   . Presence of permanent cardiac pacemaker   . Small bowel obstruction (HCC)   . Thyroid disease     Past Surgical History:  Procedure Laterality Date  . APPENDECTOMY    . BOWEL RESECTION  10/12/2014   Procedure: SMALL BOWEL RESECTION;  Surgeon: Gladis Riffle, MD;  Location: [facility];  Service: General;;  . CHOLECYSTECTOMY    . COLONOSCOPY    . INSERT / REPLACE / REMOVE PACEMAKER  09/13/2008   SSS s/p pacemaker implant; Medtronic pacemaker,.  . LAPAROTOMY N/A 10/12/2014   Procedure: EXPLORATORY LAPAROTOMY;  Surgeon: Gladis Riffle, MD;  Location: [facility];  Service: General;  Laterality: N/A;  . PACEMAKER INSERTION N/A 12/30/2017   Procedure: GENERATOR CHANGE OUT- DUAL CHAMBER;  Surgeon: Corky Downs, MD;  Location: [facility];  Service: Cardiovascular;  Laterality: N/A;  . PROSTATE SURGERY      Family History  Family history unknown: Yes    Social History   Socioeconomic History  . Marital status: Unknown    Spouse name: Not on file  . Number of children: Not on file  . Years of education: Not on file  . Highest education level: Not on file  Occupational History  . Not on file  Tobacco Use  . Smoking status: Never Smoker  . Smokeless tobacco: Never Used  Vaping Use  . Vaping Use: Never used  Substance and Sexual Activity  . Alcohol use: No    Comment: occasional  . Drug use: No  . Sexual activity: Yes    Birth control/protection: None  Other Topics Concern  . Not on file  Social History Narrative  . Not on file   Social Determinants of Health    Financial Resource Strain: Not on file  Food Insecurity: Not on file  Transportation Needs: Not on file  Physical Activity: Not on file  Stress: Not on file  Social Connections: Not on file  Intimate Partner Violence: Not on file     Current Outpatient Medications:  .  atorvastatin (LIPITOR) 10 MG tablet, TAKE ONE TABLET BY MOUTH EVERY DAY, Disp: 90 tablet, Rfl: 3 .  Cholecalciferol (VITAMIN D) 50 MCG (2000 UT) CAPS, Take 2,000 Units by mouth daily. , Disp: , Rfl:  .  furosemide (LASIX) 20 MG tablet, Take 1 tablet (20 mg total) by mouth daily., Disp: 30 tablet, Rfl: 3 .  levothyroxine (SYNTHROID) 88 MCG tablet, TAKE 1 TABLET EVERY DAY ON EMPTY STOMACHWITH A GLASS OF WATER AT LEAST 30-60 MINBEFORE BREAKFAST, Disp: 90 tablet, Rfl: 3 .  lisinopril (ZESTRIL) 2.5 MG tablet, TAKE ONE TABLET EVERY DAY, Disp: 30 tablet, Rfl: 6 .  Multiple Vitamin (MULTIVITAMIN) tablet, Take 1 tablet by mouth daily., Disp: , Rfl:  .  nadolol (CORGARD) 20 MG tablet, TAKE 1 TABLET BY MOUTH DAILY, Disp: 90 tablet, Rfl: 3 .  warfarin (COUMADIN) 3 MG tablet, TAKE ONE TABLET EVERY DAY, Disp: 30 tablet, Rfl: 6 .  warfarin (COUMADIN) 5 MG tablet, Take 5 mg by mouth daily., Disp: , Rfl:  No Known Allergies  ROS Review of Systems  Review of Systems  Constitutional: Negative for chills, diaphoresis, fever and malaise/fatigue.  HENT: Negative for congestion, ear pain and sore throat.   Respiratory: Negative for cough, shortness of breath, wheezing Cardiovascular: Negative for chest pain, palpitations, peripheral edema, orthopnea, PND Gastrointestinal: Negative for abdominal pain, constipation, diarrhea, nausea and vomiting.  Genitourinary: Negative for dysuria, incontinence, hematuria  Musculoskeletal: Negative for myalgias. Negative for arthralgias. Skin: Negative for rashes or pruritus Neurological: Negative for dizziness, syncope, headaches, focal weakness, altered sensation  Psychiatric/Behavioral: Negative  for depression and anxiety.        Objective:    Physical Exam Constitutional: The patient is oriented to person, place, and time. Pt appears well-developed and well-nourished.  Head: Normocephalic and atraumatic.  Eyes: Pupils are equal, round, and reactive to light.  Neck: No JVD present. No tracheal deviation present. No thyromegaly present.  Cardiovascular: Regular rate and rhythm. No gallop. Pulmonary/Chest: Normal breath sounds. Lungs clear to auscultation. Abdominal: No abdominal tenderness. No guarding or rebound tenderness. No hepatosplenomegaly. Musculoskeletal: Normal range of motion.  Lymphatic: No cervical adenopathy.  Neurological: No cranial nerve deficit.  Skin: Skin is warm and hydrated.  Psychiatric: The patient has a normal mood and affect.   BP 115/74   Pulse 90  Wt Readings from Last 3 Encounters:  01/11/20 185 lb 9.6 oz (84.2 kg)  12/12/19 184 lb 11.2 oz (83.8 kg)  11/16/19 187 lb 8 oz (85 kg)     Health Maintenance Due  Topic Date Due  . TETANUS/TDAP  Never done  . PNA vac Low Risk Adult (1 of 2 - PCV13) Never done  . COVID-19 Vaccine (2 - Pfizer 3-dose series) 12/12/2019    There are no preventive care reminders to display for this patient.  No results found for: TSH Lab Results  Component Value Date   WBC 6.8 11/16/2019   HGB 15.3 11/16/2019   HCT 46.9 11/16/2019   MCV 91.1 11/16/2019   PLT 168 11/16/2019   Lab Results  Component Value Date   NA 139 11/16/2019   K 4.2 11/16/2019   CO2 26 11/16/2019   GLUCOSE 72 11/16/2019   BUN 19 11/16/2019   CREATININE 1.56 (H) 11/16/2019   BILITOT 1.8 (H) 11/16/2019   ALKPHOS 52 10/08/2014   AST 40 (H) 11/16/2019   ALT 35 11/16/2019   PROT 6.2 11/16/2019   ALBUMIN 3.5 10/08/2014   CALCIUM 9.2 11/16/2019   ANIONGAP 6 12/23/2017   Lab Results  Component Value Date   CHOL 159 10/12/2012   Lab Results  Component Value Date   HDL 59 10/12/2012   Lab Results  Component Value Date    LDLCALC 83 10/12/2012   Lab Results  Component Value Date   TRIG 87 10/12/2012   Lab Results  Component Value Date   CHOLHDL 2.7 10/12/2012   No results found for: HGBA1C    Assessment & Plan:   Problem List Items Addressed This Visit      Cardiovascular and Mediastinum   Atrial fibrillation (HCC)    Patient is taking Coumadin for atrial fibrillation regularly.      Essential hypertension        - I encouraged the patient to eat a low-sodium diet to help control blood pressure. - I encouraged the patient to live an active lifestyle and complete activities that increases heart rate to 85% target heart rate at least 5 times per week for one hour.  Endocrine   Hypothyroidism    Stable at the present time.        Other   Presence of permanent cardiac pacemaker    Pacemaker is functioning well.       Other Visit Diagnoses    Cardiac pacemaker in situ    -  Primary   Relevant Orders   PACEMAKER IN CLINIC CHECK      No orders of the defined types were placed in this encounter.  Study Highlights  IMPRESSION:  Note: Medical Device Follow-up   Patient pacemaker was interrogated by pacemakers analyzer, battery status is okay. No programming changes were indicated after the review of the data. Histogram shows no change since the last interrogation  Atrial and ventricular sensing thresholds were found to be acceptable  Impedance was checked and it was found to be normal. Thresholds were found to be okay on evaluation of rhythm problem. No high rate or low rate arrhythmia were noted. Estimated battery longevity is 11 yrs I have personally reviewed the device data and amended the report as necessary.  Report approved and finalized on      Follow-up: No follow-ups on file.    Corky Downs, MD

## 2020-03-05 NOTE — Assessment & Plan Note (Signed)
Patient is taking Coumadin for atrial fibrillation regularly.

## 2020-03-05 NOTE — Assessment & Plan Note (Signed)
Stable at the present time. 

## 2020-03-05 NOTE — Assessment & Plan Note (Signed)
Pacemaker is functioning well. 

## 2020-03-08 ENCOUNTER — Telehealth: Payer: Self-pay | Admitting: Cardiovascular Disease

## 2020-03-08 NOTE — Telephone Encounter (Signed)
3 attempts to schedule fu appt from recall list.   Deleting recall.   

## 2020-03-12 ENCOUNTER — Other Ambulatory Visit: Payer: Self-pay | Admitting: Internal Medicine

## 2020-03-12 DIAGNOSIS — I4891 Unspecified atrial fibrillation: Secondary | ICD-10-CM

## 2020-03-19 ENCOUNTER — Encounter: Payer: Self-pay | Admitting: Internal Medicine

## 2020-03-19 ENCOUNTER — Other Ambulatory Visit: Payer: Self-pay | Admitting: Internal Medicine

## 2020-03-19 ENCOUNTER — Ambulatory Visit (INDEPENDENT_AMBULATORY_CARE_PROVIDER_SITE_OTHER): Payer: Medicare PPO | Admitting: Internal Medicine

## 2020-03-19 ENCOUNTER — Other Ambulatory Visit: Payer: Self-pay

## 2020-03-19 VITALS — BP 127/82 | HR 82 | Ht 74.0 in | Wt 185.6 lb

## 2020-03-19 DIAGNOSIS — R600 Localized edema: Secondary | ICD-10-CM

## 2020-03-19 DIAGNOSIS — R002 Palpitations: Secondary | ICD-10-CM

## 2020-03-19 DIAGNOSIS — I4891 Unspecified atrial fibrillation: Secondary | ICD-10-CM

## 2020-03-19 DIAGNOSIS — E039 Hypothyroidism, unspecified: Secondary | ICD-10-CM | POA: Diagnosis not present

## 2020-03-19 LAB — POCT INR: INR: 2.2 (ref 2.0–3.0)

## 2020-03-19 NOTE — Progress Notes (Signed)
Established Patient Office Visit  Subjective:  Patient ID: Johnny Norman, male    DOB: Jun 27, 1925  Age: 87 y.o. MRN: [id number]  CC:  Chief Complaint  Patient presents with  . Atrial Fibrillation    HPI  Johnny Norman presents for for general checkup.  Patient has a history of syncope and atrial fibrillation and has been on chronic anticoagulation want to get his pro time checked.   Past Medical History:  Diagnosis Date  . A-fib (HCC)   . CHF (congestive heart failure) (HCC)   . Claudication (HCC)    leg  . Hyperlipidemia   . Peripheral vascular disease (HCC)   . Presence of permanent cardiac pacemaker   . Small bowel obstruction (HCC)   . Thyroid disease     Past Surgical History:  Procedure Laterality Date  . APPENDECTOMY    . BOWEL RESECTION  10/12/2014   Procedure: SMALL BOWEL RESECTION;  Surgeon: Gladis Riffle, MD;  Location: [facility];  Service: General;;  . CHOLECYSTECTOMY    . COLONOSCOPY    . INSERT / REPLACE / REMOVE PACEMAKER  09/13/2008   SSS s/p pacemaker implant; [employer]tronic pacemaker,.  . LAPAROTOMY N/A 10/12/2014   Procedure: EXPLORATORY LAPAROTOMY;  Surgeon: Gladis Riffle, MD;  Location: [facility];  Service: General;  Laterality: N/A;  . PACEMAKER INSERTION N/A 12/30/2017   Procedure: GENERATOR CHANGE OUT- DUAL CHAMBER;  Surgeon: Corky Downs, MD;  Location: [facility];  Service: Cardiovascular;  Laterality: N/A;  . PROSTATE SURGERY      Family History  Family history unknown: Yes    Social History   Socioeconomic History  . Marital status: Unknown    Spouse name: Not on file  . Number of children: Not on file  . Years of education: Not on file  . Highest education level: Not on file  Occupational History  . Not on file  Tobacco Use  . Smoking status: Never Smoker  . Smokeless tobacco: Never Used  Vaping Use  . Vaping Use: Never used  Substance and Sexual Activity  . Alcohol use: No    Comment: occasional  . Drug use: No   . Sexual activity: Yes    Birth control/protection: None  Other Topics Concern  . Not on file  Social History Narrative  . Not on file   Social Determinants of Health   Financial Resource Strain: Not on file  Food Insecurity: Not on file  Transportation Needs: Not on file  Physical Activity: Not on file  Stress: Not on file  Social Connections: Not on file  Intimate Partner Violence: Not on file     Current Outpatient Medications:  .  atorvastatin (LIPITOR) 10 MG tablet, TAKE ONE TABLET BY MOUTH EVERY DAY, Disp: 90 tablet, Rfl: 3 .  Cholecalciferol (VITAMIN D) 50 MCG (2000 UT) CAPS, Take 2,000 Units by mouth daily. , Disp: , Rfl:  .  furosemide (LASIX) 20 MG tablet, TAKE ONE TABLET BY MOUTH EVERY DAY, Disp: 30 tablet, Rfl: 3 .  levothyroxine (SYNTHROID) 88 MCG tablet, TAKE 1 TABLET EVERY DAY ON EMPTY STOMACHWITH A GLASS OF WATER AT LEAST 30-60 MINBEFORE BREAKFAST, Disp: 90 tablet, Rfl: 3 .  lisinopril (ZESTRIL) 2.5 MG tablet, TAKE ONE TABLET EVERY DAY, Disp: 30 tablet, Rfl: 6 .  Multiple Vitamin (MULTIVITAMIN) tablet, Take 1 tablet by mouth daily., Disp: , Rfl:  .  nadolol (CORGARD) 20 MG tablet, TAKE 1 TABLET BY MOUTH DAILY, Disp: 90 tablet, Rfl: 3 .  warfarin (COUMADIN) 3 MG tablet, TAKE ONE TABLET EVERY DAY, Disp: 30 tablet, Rfl: 6 .  warfarin (COUMADIN) 5 MG tablet, Take 5 mg by mouth daily., Disp: , Rfl:    No Known Allergies  ROS Review of Systems  Constitutional: Negative.   HENT: Negative.   Eyes: Negative.   Respiratory: Negative.   Cardiovascular: Negative.   Gastrointestinal: Negative.   Endocrine: Negative.   Genitourinary: Negative.   Musculoskeletal: Negative.   Skin: Negative.   Allergic/Immunologic: Negative.   Neurological: Negative.   Hematological: Negative.   Psychiatric/Behavioral: Negative.   All other systems reviewed and are negative.     Objective:    Physical Exam Vitals reviewed.  Constitutional:      Appearance: Normal appearance.   HENT:     Mouth/Throat:     Mouth: Mucous membranes are moist.  Eyes:     Pupils: Pupils are equal, round, and reactive to light.  Neck:     Vascular: No carotid bruit.  Cardiovascular:     Rate and Rhythm: Normal rate and regular rhythm.     Pulses: Normal pulses.     Heart sounds: Normal heart sounds.  Pulmonary:     Effort: Pulmonary effort is normal.     Breath sounds: Normal breath sounds.  Abdominal:     General: Bowel sounds are normal.     Palpations: Abdomen is soft. There is no hepatomegaly, splenomegaly or mass.     Tenderness: There is no abdominal tenderness.     Hernia: No hernia is present.  Musculoskeletal:     Cervical back: Neck supple.     Right lower leg: No edema.     Left lower leg: No edema.  Skin:    Findings: No rash.  Neurological:     Mental Status: He is alert and oriented to person, place, and time.     Motor: No weakness.  Psychiatric:        Mood and Affect: Mood normal.        Behavior: Behavior normal.     BP 127/82   Pulse 82   Ht 6\' 2"  (1.88 m)   Wt 185 lb 9.6 oz (84.2 kg)   BMI 23.83 kg/m  Wt Readings from Last 3 Encounters:  03/19/20 185 lb 9.6 oz (84.2 kg)  01/11/20 185 lb 9.6 oz (84.2 kg)  12/12/19 184 lb 11.2 oz (83.8 kg)     Health Maintenance Due  Topic Date Due  . TETANUS/TDAP  Never done  . PNA vac Low Risk Adult (1 of 2 - PCV13) Never done  . COVID-19 Vaccine (2 - Pfizer 3-dose series) 12/12/2019    There are no preventive care reminders to display for this patient.  No results found for: TSH Lab Results  Component Value Date   WBC 6.8 11/16/2019   HGB 15.3 11/16/2019   HCT 46.9 11/16/2019   MCV 91.1 11/16/2019   PLT 168 11/16/2019   Lab Results  Component Value Date   NA 139 11/16/2019   K 4.2 11/16/2019   CO2 26 11/16/2019   GLUCOSE 72 11/16/2019   BUN 19 11/16/2019   CREATININE 1.56 (H) 11/16/2019   BILITOT 1.8 (H) 11/16/2019   ALKPHOS 52 10/08/2014   AST 40 (H) 11/16/2019   ALT 35 11/16/2019    PROT 6.2 11/16/2019   ALBUMIN 3.5 10/08/2014   CALCIUM 9.2 11/16/2019   ANIONGAP 6 12/23/2017   Lab Results  Component Value Date   CHOL 159 10/12/2012  Lab Results  Component Value Date   HDL 59 10/12/2012   Lab Results  Component Value Date   LDLCALC 83 10/12/2012   Lab Results  Component Value Date   TRIG 87 10/12/2012   Lab Results  Component Value Date   CHOLHDL 2.7 10/12/2012   No results found for: HGBA1C    Assessment & Plan:   Problem List Items Addressed This Visit      Cardiovascular and Mediastinum   Atrial fibrillation (HCC) - Primary    Atrial fibrillation is under good control.  Patient is on Coumadin and pro time is therapeutic.  He denies any chest pain or shortness of breath on today's examination there was no pedal edema.  No tenderness in the legs.      Relevant Orders   POCT INR (Completed)     Endocrine   Hypothyroidism    Patient hypothyroid status is under control on Levoxyl.        Other   Bilateral leg edema    Bilateral pedal edema is resolved.  He does not have any swelling of the abdomen.  Chest is clear on auscultation heart is regular.  Liver is not enlarged.      Palpitations    Patient uncontrollable palpitation there is no history of syncope or passing out spell or presyncopal episode.         No orders of the defined types were placed in this encounter.   Follow-up: No follow-ups on file.    Corky Downs, MD

## 2020-03-19 NOTE — Assessment & Plan Note (Signed)
Atrial fibrillation is under good control.  Patient is on Coumadin and pro time is therapeutic.  He denies any chest pain or shortness of breath on today's examination there was no pedal edema.  No tenderness in the legs.

## 2020-03-19 NOTE — Assessment & Plan Note (Signed)
Patient uncontrollable palpitation there is no history of syncope or passing out spell or presyncopal episode.

## 2020-03-19 NOTE — Assessment & Plan Note (Signed)
Patient hypothyroid status is under control on Levoxyl.

## 2020-03-19 NOTE — Assessment & Plan Note (Signed)
Bilateral pedal edema is resolved.  He does not have any swelling of the abdomen.  Chest is clear on auscultation heart is regular.  Liver is not enlarged.

## 2020-04-16 ENCOUNTER — Ambulatory Visit: Payer: Medicare PPO | Admitting: Internal Medicine

## 2020-04-16 ENCOUNTER — Encounter: Payer: Self-pay | Admitting: Internal Medicine

## 2020-04-16 ENCOUNTER — Other Ambulatory Visit: Payer: Self-pay

## 2020-04-16 VITALS — BP 116/82 | HR 90 | Ht 76.0 in | Wt 178.3 lb

## 2020-04-16 DIAGNOSIS — N138 Other obstructive and reflux uropathy: Secondary | ICD-10-CM

## 2020-04-16 DIAGNOSIS — R2681 Unsteadiness on feet: Secondary | ICD-10-CM | POA: Diagnosis not present

## 2020-04-16 DIAGNOSIS — E038 Other specified hypothyroidism: Secondary | ICD-10-CM

## 2020-04-16 DIAGNOSIS — I10 Essential (primary) hypertension: Secondary | ICD-10-CM | POA: Diagnosis not present

## 2020-04-16 DIAGNOSIS — I4891 Unspecified atrial fibrillation: Secondary | ICD-10-CM | POA: Diagnosis not present

## 2020-04-16 DIAGNOSIS — E782 Mixed hyperlipidemia: Secondary | ICD-10-CM

## 2020-04-16 DIAGNOSIS — N401 Enlarged prostate with lower urinary tract symptoms: Secondary | ICD-10-CM

## 2020-04-16 DIAGNOSIS — E063 Autoimmune thyroiditis: Secondary | ICD-10-CM

## 2020-04-16 LAB — POCT INR: INR: 2.5 (ref 2.0–3.0)

## 2020-04-16 NOTE — Assessment & Plan Note (Signed)
Blood pressure stable at the present time 

## 2020-04-16 NOTE — Progress Notes (Signed)
Established Patient Office Visit  Subjective:  Patient ID: Johnny Norman, male    DOB: March 11, 1925  Age: 87 y.o. MRN: [id number]  CC:  Chief Complaint  Patient presents with  . Atrial Fibrillation    1 month INR check    HPI  Johnny Norman presents for a pro time.  He denies any history of syncope or chest pain TIA or any eye problem.  He denies any history of swelling of the legs.  He does not smoke does not drink.  Denies any history of shortness of breath on exertion.  Denies any claudication.  He is taking his fluid pill, levothyroxine. Past Medical History:  Diagnosis Date  . A-fib (HCC)   . CHF (congestive heart failure) (HCC)   . Claudication (HCC)    leg  . Hyperlipidemia   . Peripheral vascular disease (HCC)   . Presence of permanent cardiac pacemaker   . Small bowel obstruction (HCC)   . Thyroid disease     Past Surgical History:  Procedure Laterality Date  . APPENDECTOMY    . BOWEL RESECTION  10/12/2014   Procedure: SMALL BOWEL RESECTION;  Surgeon: Gladis Riffle, MD;  Location: [facility];  Service: General;;  . CHOLECYSTECTOMY    . COLONOSCOPY    . INSERT / REPLACE / REMOVE PACEMAKER  09/13/2008   SSS s/p pacemaker implant; [employer]tr[employer] pacemaker,.  . LAPAROTOMY N/A 10/12/2014   Procedure: EXPLORATORY LAPAROTOMY;  Surgeon: Gladis Riffle, MD;  Location: [facility];  Service: General;  Laterality: N/A;  . PACEMAKER INSERTION N/A 12/30/2017   Procedure: GENERATOR CHANGE OUT- DUAL CHAMBER;  Surgeon: Corky Downs, MD;  Location: [facility];  Service: Cardiovascular;  Laterality: N/A;  . PROSTATE SURGERY      Family History  Family history unknown: Yes    Social History   Socioeconomic History  . Marital status: Unknown    Spouse name: Not on file  . Number of children: Not on file  . Years of education: Not on file  . Highest education level: Not on file  Occupational History  . Not on file  Tobacco Use  . Smoking status: Never Smoker  .  Smokeless tobacco: Never Used  Vaping Use  . Vaping Use: Never used  Substance and Sexual Activity  . Alcohol use: No    Comment: occasional  . Drug use: No  . Sexual activity: Yes    Birth control/protection: None  Other Topics Concern  . Not on file  Social History Narrative  . Not on file   Social Determinants of Health   Financial Resource Strain: Not on file  Food Insecurity: Not on file  Transportation Needs: Not on file  Physical Activity: Not on file  Stress: Not on file  Social Connections: Not on file  Intimate Partner Violence: Not on file     Current Outpatient Medications:  .  atorvastatin (LIPITOR) 10 MG tablet, TAKE ONE TABLET BY MOUTH EVERY DAY, Disp: 90 tablet, Rfl: 3 .  Cholecalciferol (VITAMIN D) 50 MCG (2000 UT) CAPS, Take 2,000 Units by mouth daily. , Disp: , Rfl:  .  furosemide (LASIX) 20 MG tablet, TAKE ONE TABLET BY MOUTH EVERY DAY, Disp: 30 tablet, Rfl: 3 .  levothyroxine (SYNTHROID) 88 MCG tablet, TAKE 1 TABLET EVERY DAY ON EMPTY STOMACHWITH A GLASS OF WATER AT LEAST 30-60 MINBEFORE BREAKFAST, Disp: 90 tablet, Rfl: 3 .  lisinopril (ZESTRIL) 2.5 MG tablet, TAKE ONE TABLET EVERY DAY, Disp: 30 tablet, Rfl:  6 .  Multiple Vitamin (MULTIVITAMIN) tablet, Take 1 tablet by mouth daily., Disp: , Rfl:  .  nadolol (CORGARD) 20 MG tablet, TAKE 1 TABLET BY MOUTH DAILY, Disp: 90 tablet, Rfl: 3 .  warfarin (COUMADIN) 3 MG tablet, TAKE ONE TABLET EVERY DAY, Disp: 30 tablet, Rfl: 6 .  warfarin (COUMADIN) 5 MG tablet, Take 5 mg by mouth daily., Disp: , Rfl:    No Known Allergies  ROS Review of Systems  Constitutional: Negative.   HENT: Negative.   Eyes: Negative.   Respiratory: Negative.   Cardiovascular: Negative.   Gastrointestinal: Negative.   Endocrine: Negative.   Genitourinary: Negative.   Musculoskeletal: Negative.   Skin: Negative.   Allergic/Immunologic: Negative.   Neurological: Negative.   Hematological: Negative.   Psychiatric/Behavioral:  Negative.   All other systems reviewed and are negative.     Objective:    Physical Exam Vitals reviewed.  Constitutional:      Appearance: Normal appearance.  HENT:     Mouth/Throat:     Mouth: Mucous membranes are moist.  Eyes:     Pupils: Pupils are equal, round, and reactive to light.  Neck:     Vascular: No carotid bruit.  Cardiovascular:     Rate and Rhythm: Normal rate and regular rhythm.     Pulses: Normal pulses.     Heart sounds: Normal heart sounds.  Pulmonary:     Effort: Pulmonary effort is normal.     Breath sounds: Normal breath sounds.  Abdominal:     General: Bowel sounds are normal.     Palpations: Abdomen is soft. There is no hepatomegaly, splenomegaly or mass.     Tenderness: There is no abdominal tenderness.     Hernia: No hernia is present.  Musculoskeletal:     Cervical back: Neck supple.     Right lower leg: No edema.     Left lower leg: No edema.  Skin:    Findings: No rash.  Neurological:     Mental Status: He is alert and oriented to person, place, and time.     Motor: No weakness.  Psychiatric:        Mood and Affect: Mood normal.        Behavior: Behavior normal.     BP 116/82   Pulse 90   Ht 6\' 4"  (1.93 m)   Wt 178 lb 4.8 oz (80.9 kg)   BMI 21.70 kg/m  Wt Readings from Last 3 Encounters:  04/16/20 178 lb 4.8 oz (80.9 kg)  03/19/20 185 lb 9.6 oz (84.2 kg)  01/11/20 185 lb 9.6 oz (84.2 kg)     Health Maintenance Due  Topic Date Due  . TETANUS/TDAP  Never done  . PNA vac Low Risk Adult (1 of 2 - PCV13) Never done  . COVID-19 Vaccine (2 - Pfizer 3-dose series) 12/12/2019    There are no preventive care reminders to display for this patient.  No results found for: TSH Lab Results  Component Value Date   WBC 6.8 11/16/2019   HGB 15.3 11/16/2019   HCT 46.9 11/16/2019   MCV 91.1 11/16/2019   PLT 168 11/16/2019   Lab Results  Component Value Date   NA 139 11/16/2019   K 4.2 11/16/2019   CO2 26 11/16/2019   GLUCOSE  72 11/16/2019   BUN 19 11/16/2019   CREATININE 1.56 (H) 11/16/2019   BILITOT 1.8 (H) 11/16/2019   ALKPHOS 52 10/08/2014   AST 40 (H) 11/16/2019  ALT 35 11/16/2019   PROT 6.2 11/16/2019   ALBUMIN 3.5 10/08/2014   CALCIUM 9.2 11/16/2019   ANIONGAP 6 12/23/2017   Lab Results  Component Value Date   CHOL 159 10/12/2012   Lab Results  Component Value Date   HDL 59 10/12/2012   Lab Results  Component Value Date   LDLCALC 83 10/12/2012   Lab Results  Component Value Date   TRIG 87 10/12/2012   Lab Results  Component Value Date   CHOLHDL 2.7 10/12/2012   No results found for: HGBA1C    Assessment & Plan:   Problem List Items Addressed This Visit      Cardiovascular and Mediastinum   Atrial fibrillation (HCC) - Primary    Atrial fibrillation is stable at the present time without any history of TIA or syncope.      Relevant Orders   POCT INR (Completed)   Essential hypertension    Blood pressure stable at the present time        Endocrine   Hypothyroidism    Patient is taking his thyroid medication on a regular basis.        Genitourinary   Benign prostatic hyperplasia with urinary obstruction    Patient has symptom of hesitancy and frequency, I referred him to urologist.        Other   Hyperlipidemia    Patient is advised to follow low-cholesterol diet.      Unsteady gait    Patient does not have any focal neurological sign.         No orders of the defined types were placed in this encounter.   Follow-up: No follow-ups on file.    Corky Downs, MD

## 2020-04-16 NOTE — Assessment & Plan Note (Signed)
Patient is advised to follow low-cholesterol diet.

## 2020-04-16 NOTE — Assessment & Plan Note (Signed)
Patient does not have any focal neurological sign ?

## 2020-04-16 NOTE — Assessment & Plan Note (Signed)
Patient has symptom of hesitancy and frequency, I referred him to urologist.

## 2020-04-16 NOTE — Assessment & Plan Note (Signed)
Patient is taking his thyroid medication on a regular basis.

## 2020-04-16 NOTE — Assessment & Plan Note (Signed)
Atrial fibrillation is stable at the present time without any history of TIA or syncope.

## 2020-05-21 ENCOUNTER — Encounter: Payer: Self-pay | Admitting: Internal Medicine

## 2020-05-21 ENCOUNTER — Other Ambulatory Visit: Payer: Self-pay

## 2020-05-21 ENCOUNTER — Ambulatory Visit (INDEPENDENT_AMBULATORY_CARE_PROVIDER_SITE_OTHER): Payer: Medicare PPO | Admitting: Internal Medicine

## 2020-05-21 VITALS — BP 104/69 | HR 89 | Wt 174.3 lb

## 2020-05-21 DIAGNOSIS — E063 Autoimmune thyroiditis: Secondary | ICD-10-CM

## 2020-05-21 DIAGNOSIS — I495 Sick sinus syndrome: Secondary | ICD-10-CM | POA: Diagnosis not present

## 2020-05-21 DIAGNOSIS — I10 Essential (primary) hypertension: Secondary | ICD-10-CM | POA: Diagnosis not present

## 2020-05-21 DIAGNOSIS — E038 Other specified hypothyroidism: Secondary | ICD-10-CM | POA: Diagnosis not present

## 2020-05-21 DIAGNOSIS — I4891 Unspecified atrial fibrillation: Secondary | ICD-10-CM | POA: Diagnosis not present

## 2020-05-21 LAB — POCT INR: INR: 2 (ref 2.0–3.0)

## 2020-05-21 NOTE — Assessment & Plan Note (Signed)
No syncope.   

## 2020-05-21 NOTE — Progress Notes (Signed)
Established Patient Office Visit  Subjective:  Patient ID: Johnny Norman, male    DOB: [birth year]  Age: 87 y.o. MRN: [id number]  CC:  Chief Complaint  Patient presents with  . atrial fibrilation     HPI  Johnny Norman presents for regular checkup.  He denies any chest pain or shortness of breath denies any history of swelling of the legs.  There is no history of syncope or rapid heart rate.  Atrial fibrillation is under control.  Past Medical History:  Diagnosis Date  . A-fib (HCC)   . CHF (congestive heart failure) (HCC)   . Claudication (HCC)    leg  . Hyperlipidemia   . Peripheral vascular disease (HCC)   . Presence of permanent cardiac pacemaker   . Small bowel obstruction (HCC)   . Thyroid disease     Past Surgical History:  Procedure Laterality Date  . APPENDECTOMY    . BOWEL RESECTION  10/12/2014   Procedure: SMALL BOWEL RESECTION;  Surgeon: Gladis Riffle, MD;  Location: [facility];  Service: General;;  . CHOLECYSTECTOMY    . COLONOSCOPY    . INSERT / REPLACE / REMOVE PACEMAKER  09/13/2008   SSS s/p pacemaker implant; Medtronic pacemaker,.  . LAPAROTOMY N/A 10/12/2014   Procedure: EXPLORATORY LAPAROTOMY;  Surgeon: Gladis Riffle, MD;  Location: [facility];  Service: General;  Laterality: N/A;  . PACEMAKER INSERTION N/A 12/30/2017   Procedure: GENERATOR CHANGE OUT- DUAL CHAMBER;  Surgeon: Corky Downs, MD;  Location: [facility];  Service: Cardiovascular;  Laterality: N/A;  . PROSTATE SURGERY      Family History  Family history unknown: Yes    Social History   Socioeconomic History  . Marital status: Unknown    Spouse name: Not on file  . Number of children: Not on file  . Years of education: Not on file  . Highest education level: Not on file  Occupational History  . Not on file  Tobacco Use  . Smoking status: Never Smoker  . Smokeless tobacco: Never Used  Vaping Use  . Vaping Use: Never used  Substance and Sexual Activity  . Alcohol  use: No    Comment: occasional  . Drug use: No  . Sexual activity: Yes    Birth control/protection: None  Other Topics Concern  . Not on file  Social History Narrative  . Not on file   Social Determinants of Health   Financial Resource Strain: Not on file  Food Insecurity: Not on file  Transportation Needs: Not on file  Physical Activity: Not on file  Stress: Not on file  Social Connections: Not on file  Intimate Partner Violence: Not on file     Current Outpatient Medications:  .  atorvastatin (LIPITOR) 10 MG tablet, TAKE ONE TABLET BY MOUTH EVERY DAY, Disp: 90 tablet, Rfl: 3 .  Cholecalciferol (VITAMIN D) 50 MCG (2000 UT) CAPS, Take 2,000 Units by mouth daily. , Disp: , Rfl:  .  furosemide (LASIX) 20 MG tablet, TAKE ONE TABLET BY MOUTH EVERY DAY, Disp: 30 tablet, Rfl: 3 .  levothyroxine (SYNTHROID) 88 MCG tablet, TAKE 1 TABLET EVERY DAY ON EMPTY STOMACHWITH A GLASS OF WATER AT LEAST 30-60 MINBEFORE BREAKFAST, Disp: 90 tablet, Rfl: 3 .  lisinopril (ZESTRIL) 2.5 MG tablet, TAKE ONE TABLET EVERY DAY, Disp: 30 tablet, Rfl: 6 .  Multiple Vitamin (MULTIVITAMIN) tablet, Take 1 tablet by mouth daily., Disp: , Rfl:  .  nadolol (CORGARD) 20 MG tablet, TAKE 1  TABLET BY MOUTH DAILY, Disp: 90 tablet, Rfl: 3 .  warfarin (COUMADIN) 3 MG tablet, TAKE ONE TABLET EVERY DAY, Disp: 30 tablet, Rfl: 6 .  warfarin (COUMADIN) 5 MG tablet, Take 5 mg by mouth daily., Disp: , Rfl:    No Known Allergies  ROS Review of Systems  Constitutional: Negative.   HENT: Negative.   Eyes: Negative.   Respiratory: Negative.   Cardiovascular: Negative.   Gastrointestinal: Negative.   Endocrine: Negative.   Genitourinary: Negative.   Musculoskeletal: Negative.   Skin: Negative.   Allergic/Immunologic: Negative.   Neurological: Negative.   Hematological: Negative.   Psychiatric/Behavioral: Negative.   All other systems reviewed and are negative.     Objective:    Physical Exam Vitals reviewed.   Constitutional:      Appearance: Normal appearance.  HENT:     Mouth/Throat:     Mouth: Mucous membranes are moist.  Eyes:     Pupils: Pupils are equal, round, and reactive to light.  Neck:     Vascular: No carotid bruit.  Cardiovascular:     Rate and Rhythm: Normal rate and regular rhythm.     Pulses: Normal pulses.     Heart sounds: Normal heart sounds.  Pulmonary:     Effort: Pulmonary effort is normal.     Breath sounds: Normal breath sounds.  Abdominal:     General: Bowel sounds are normal.     Palpations: Abdomen is soft. There is no hepatomegaly, splenomegaly or mass.     Tenderness: There is no abdominal tenderness.     Hernia: No hernia is present.  Musculoskeletal:     Cervical back: Neck supple.     Right lower leg: No edema.     Left lower leg: No edema.  Skin:    Findings: No rash.  Neurological:     Mental Status: He is alert and oriented to person, place, and time.     Motor: No weakness.  Psychiatric:        Mood and Affect: Mood normal.        Behavior: Behavior normal.     BP 104/69   Pulse 89   Wt 174 lb 4.8 oz (79.1 kg)   BMI 21.22 kg/m  Wt Readings from Last 3 Encounters:  05/21/20 174 lb 4.8 oz (79.1 kg)  04/16/20 178 lb 4.8 oz (80.9 kg)  03/19/20 185 lb 9.6 oz (84.2 kg)     Health Maintenance Due  Topic Date Due  . TETANUS/TDAP  Never done  . PNA vac Low Risk Adult (1 of 2 - PCV13) Never done  . COVID-19 Vaccine (2 - Pfizer 3-dose series) 12/12/2019    There are no preventive care reminders to display for this patient.  No results found for: TSH Lab Results  Component Value Date   WBC 6.8 11/16/2019   HGB 15.3 11/16/2019   HCT 46.9 11/16/2019   MCV 91.1 11/16/2019   PLT 168 11/16/2019   Lab Results  Component Value Date   NA 139 11/16/2019   K 4.2 11/16/2019   CO2 26 11/16/2019   GLUCOSE 72 11/16/2019   BUN 19 11/16/2019   CREATININE 1.56 (H) 11/16/2019   BILITOT 1.8 (H) 11/16/2019   ALKPHOS 52 10/08/2014   AST 40  (H) 11/16/2019   ALT 35 11/16/2019   PROT 6.2 11/16/2019   ALBUMIN 3.5 10/08/2014   CALCIUM 9.2 11/16/2019   ANIONGAP 6 12/23/2017   Lab Results  Component Value Date  CHOL 159 10/12/2012   Lab Results  Component Value Date   HDL 59 10/12/2012   Lab Results  Component Value Date   LDLCALC 83 10/12/2012   Lab Results  Component Value Date   TRIG 87 10/12/2012   Lab Results  Component Value Date   CHOLHDL 2.7 10/12/2012   No results found for: HGBA1C    Assessment & Plan:   Problem List Items Addressed This Visit      Cardiovascular and Mediastinum   Atrial fibrillation (HCC) - Primary    Atrial fibrillation is under control.      Relevant Orders   POCT INR (Completed)   Sick sinus syndrome (HCC)    No syncope.      Essential hypertension    Patient blood pressure is normal patient denies any chest pain or shortness of breath there is no history of palpitation or paroxysmal nocturnal dyspnea   patient was advised to follow low-salt low-cholesterol diet             Endocrine   Hypothyroidism    Stable on medication.         No orders of the defined types were placed in this encounter.   Follow-up: No follow-ups on file.  Pro time is therapeutic patient was advised to continue taking same dose of Coumadin.  Corky Downs, MD

## 2020-05-21 NOTE — Assessment & Plan Note (Signed)
Patient blood pressure is normal patient denies any chest pain or shortness of breath there is no history of palpitation or paroxysmal nocturnal dyspnea   patient was advised to follow low-salt low-cholesterol diet   

## 2020-05-21 NOTE — Assessment & Plan Note (Signed)
Atrial fibrillation is under control. 

## 2020-05-21 NOTE — Assessment & Plan Note (Signed)
Stable on medication

## 2020-06-04 ENCOUNTER — Ambulatory Visit (INDEPENDENT_AMBULATORY_CARE_PROVIDER_SITE_OTHER): Payer: Medicare PPO | Admitting: Internal Medicine

## 2020-06-04 VITALS — BP 114/77 | HR 79

## 2020-06-04 DIAGNOSIS — Z950 Presence of cardiac pacemaker: Secondary | ICD-10-CM | POA: Diagnosis not present

## 2020-06-04 DIAGNOSIS — I4891 Unspecified atrial fibrillation: Secondary | ICD-10-CM | POA: Diagnosis not present

## 2020-06-04 DIAGNOSIS — E038 Other specified hypothyroidism: Secondary | ICD-10-CM | POA: Diagnosis not present

## 2020-06-04 DIAGNOSIS — N138 Other obstructive and reflux uropathy: Secondary | ICD-10-CM | POA: Diagnosis not present

## 2020-06-04 DIAGNOSIS — I10 Essential (primary) hypertension: Secondary | ICD-10-CM

## 2020-06-04 DIAGNOSIS — N401 Enlarged prostate with lower urinary tract symptoms: Secondary | ICD-10-CM | POA: Diagnosis not present

## 2020-06-04 DIAGNOSIS — E063 Autoimmune thyroiditis: Secondary | ICD-10-CM | POA: Diagnosis not present

## 2020-06-04 DIAGNOSIS — I495 Sick sinus syndrome: Secondary | ICD-10-CM | POA: Diagnosis not present

## 2020-06-06 ENCOUNTER — Other Ambulatory Visit: Payer: Self-pay | Admitting: Internal Medicine

## 2020-06-06 DIAGNOSIS — I4891 Unspecified atrial fibrillation: Secondary | ICD-10-CM

## 2020-06-09 ENCOUNTER — Encounter: Payer: Self-pay | Admitting: Internal Medicine

## 2020-06-09 NOTE — Assessment & Plan Note (Signed)
Stable at the present time. 

## 2020-06-09 NOTE — Assessment & Plan Note (Signed)
Stable

## 2020-06-09 NOTE — Assessment & Plan Note (Signed)
No complaints

## 2020-06-09 NOTE — Assessment & Plan Note (Signed)
Patient is functioning well 

## 2020-06-09 NOTE — Progress Notes (Signed)
Established Patient Office Visit  Subjective:  Patient ID: Johnny Norman, male    DOB: Jan 04, 1926  Age: 87 y.o. MRN: [id number]  CC:  Chief Complaint  Patient presents with  . Pacemaker Check    HPI  PHI AVANS presents for pacer check.  Patient denies any chest pain or shortness of breath.  He does not smoke does not drink.  There is no history of passing out spells.  Past Medical History:  Diagnosis Date  . A-fib (HCC)   . CHF (congestive heart failure) (HCC)   . Claudication (HCC)    leg  . Hyperlipidemia   . Peripheral vascular disease (HCC)   . Presence of permanent cardiac pacemaker   . Small bowel obstruction (HCC)   . Thyroid disease     Past Surgical History:  Procedure Laterality Date  . APPENDECTOMY    . BOWEL RESECTION  10/12/2014   Procedure: SMALL BOWEL RESECTION;  Surgeon: Gladis Riffle, MD;  Location: [facility];  Service: General;;  . CHOLECYSTECTOMY    . COLONOSCOPY    . INSERT / REPLACE / REMOVE PACEMAKER  09/13/2008   SSS s/p pacemaker implant; Medtronic pacemaker,.  . LAPAROTOMY N/A 10/12/2014   Procedure: EXPLORATORY LAPAROTOMY;  Surgeon: Gladis Riffle, MD;  Location: [facility];  Service: General;  Laterality: N/A;  . PACEMAKER INSERTION N/A 12/30/2017   Procedure: GENERATOR CHANGE OUT- DUAL CHAMBER;  Surgeon: Corky Downs, MD;  Location: [facility];  Service: Cardiovascular;  Laterality: N/A;  . PROSTATE SURGERY      Family History  Family history unknown: Yes    Social History   Socioeconomic History  . Marital status: Unknown    Spouse name: Not on file  . Number of children: Not on file  . Years of education: Not on file  . Highest education level: Not on file  Occupational History  . Not on file  Tobacco Use  . Smoking status: Never Smoker  . Smokeless tobacco: Never Used  Vaping Use  . Vaping Use: Never used  Substance and Sexual Activity  . Alcohol use: No    Comment: occasional  . Drug use: No  . Sexual  activity: Yes    Birth control/protection: None  Other Topics Concern  . Not on file  Social History Narrative  . Not on file   Social Determinants of Health   Financial Resource Strain: Not on file  Food Insecurity: Not on file  Transportation Needs: Not on file  Physical Activity: Not on file  Stress: Not on file  Social Connections: Not on file  Intimate Partner Violence: Not on file     Current Outpatient Medications:  .  atorvastatin (LIPITOR) 10 MG tablet, TAKE ONE TABLET BY MOUTH EVERY DAY, Disp: 90 tablet, Rfl: 3 .  Cholecalciferol (VITAMIN D) 50 MCG (2000 UT) CAPS, Take 2,000 Units by mouth daily. , Disp: , Rfl:  .  levothyroxine (SYNTHROID) 88 MCG tablet, TAKE 1 TABLET EVERY DAY ON EMPTY STOMACHWITH A GLASS OF WATER AT LEAST 30-60 MINBEFORE BREAKFAST, Disp: 90 tablet, Rfl: 3 .  lisinopril (ZESTRIL) 2.5 MG tablet, TAKE ONE TABLET EVERY DAY, Disp: 30 tablet, Rfl: 6 .  Multiple Vitamin (MULTIVITAMIN) tablet, Take 1 tablet by mouth daily., Disp: , Rfl:  .  warfarin (COUMADIN) 3 MG tablet, TAKE ONE TABLET EVERY DAY, Disp: 30 tablet, Rfl: 6 .  warfarin (COUMADIN) 5 MG tablet, Take 5 mg by mouth daily., Disp: , Rfl:  .  furosemide (  LASIX) 20 MG tablet, TAKE 1 TABLET BY MOUTH DAILY, Disp: 30 tablet, Rfl: 3 .  nadolol (CORGARD) 20 MG tablet, TAKE 1 TABLET BY MOUTH DAILY, Disp: 90 tablet, Rfl: 3   No Known Allergies  ROS Review of Systems  Constitutional: Negative.   HENT: Negative.   Eyes: Negative.   Respiratory: Negative.   Cardiovascular: Negative.   Gastrointestinal: Negative.   Endocrine: Negative.   Genitourinary: Negative.   Musculoskeletal: Negative.   Skin: Negative.   Allergic/Immunologic: Negative.   Neurological: Negative.   Hematological: Negative.   Psychiatric/Behavioral: Negative.   All other systems reviewed and are negative.     Objective:    Physical Exam Vitals reviewed.  Constitutional:      Appearance: Normal appearance.  HENT:      Mouth/Throat:     Mouth: Mucous membranes are moist.  Eyes:     Pupils: Pupils are equal, round, and reactive to light.  Neck:     Vascular: No carotid bruit.  Cardiovascular:     Rate and Rhythm: Normal rate and regular rhythm.     Pulses: Normal pulses.     Heart sounds: Normal heart sounds.  Pulmonary:     Effort: Pulmonary effort is normal.     Breath sounds: Normal breath sounds.  Abdominal:     General: Bowel sounds are normal.     Palpations: Abdomen is soft. There is no hepatomegaly, splenomegaly or mass.     Tenderness: There is no abdominal tenderness.     Hernia: No hernia is present.  Musculoskeletal:     Cervical back: Neck supple.     Right lower leg: No edema.     Left lower leg: No edema.  Skin:    Findings: No rash.  Neurological:     Mental Status: He is alert and oriented to person, place, and time.     Motor: No weakness.  Psychiatric:        Mood and Affect: Mood normal.        Behavior: Behavior normal.     BP 114/77   Pulse 79  Wt Readings from Last 3 Encounters:  05/21/20 174 lb 4.8 oz (79.1 kg)  04/16/20 178 lb 4.8 oz (80.9 kg)  03/19/20 185 lb 9.6 oz (84.2 kg)     Health Maintenance Due  Topic Date Due  . TETANUS/TDAP  Never done  . PNA vac Low Risk Adult (1 of 2 - PCV13) Never done  . COVID-19 Vaccine (2 - Pfizer 3-dose series) 12/12/2019    There are no preventive care reminders to display for this patient.  No results found for: TSH Lab Results  Component Value Date   WBC 6.8 11/16/2019   HGB 15.3 11/16/2019   HCT 46.9 11/16/2019   MCV 91.1 11/16/2019   PLT 168 11/16/2019   Lab Results  Component Value Date   NA 139 11/16/2019   K 4.2 11/16/2019   CO2 26 11/16/2019   GLUCOSE 72 11/16/2019   BUN 19 11/16/2019   CREATININE 1.56 (H) 11/16/2019   BILITOT 1.8 (H) 11/16/2019   ALKPHOS 52 10/08/2014   AST 40 (H) 11/16/2019   ALT 35 11/16/2019   PROT 6.2 11/16/2019   ALBUMIN 3.5 10/08/2014   CALCIUM 9.2 11/16/2019    ANIONGAP 6 12/23/2017   Lab Results  Component Value Date   CHOL 159 10/12/2012   Lab Results  Component Value Date   HDL 59 10/12/2012   Lab Results  Component Value Date  LDLCALC 83 10/12/2012   Lab Results  Component Value Date   TRIG 87 10/12/2012   Lab Results  Component Value Date   CHOLHDL 2.7 10/12/2012   No results found for: HGBA1C    Assessment & Plan:   Problem List Items Addressed This Visit      Cardiovascular and Mediastinum   Atrial fibrillation (HCC)    Stable at the present time      Sick sinus syndrome [facility])    Patient pacer is functioning well.      Essential hypertension    Blood pressure is stable on the present medication        Endocrine   Hypothyroidism    Stable        Genitourinary   Benign prostatic hyperplasia with urinary obstruction    No complaints.       Other Visit Diagnoses    Pacemaker    -  Primary   Relevant Orders   PACEMAKER IN CLINIC CHECK (Completed)    Note: Medical Device Follow-up  Patient pacemaker was interrogated by pacemakers analyzer, battery status is okay.  No programming changes were indicated after the review of the data.  Histogram shows no change since the last interrogation Atrial and ventricular sensing thresholds were found to be acceptable Impedance was checked and it was found to be normal.  Thresholds were found to be okay on evaluation of rhythm problem.  No high rate or low rate arrhythmia were noted.  Estimated battery longevity is10.7 yrs   I have personally reviewed the device data and amended the report as necessary.  No orders of the defined types were placed in this encounter.   Follow-up: No follow-ups on file.    Corky Downs, MD

## 2020-06-09 NOTE — Assessment & Plan Note (Signed)
Blood pressure is stable on the present medication 

## 2020-06-20 ENCOUNTER — Encounter: Payer: Self-pay | Admitting: Internal Medicine

## 2020-06-20 ENCOUNTER — Other Ambulatory Visit: Payer: Self-pay

## 2020-06-20 ENCOUNTER — Ambulatory Visit: Payer: Medicare PPO | Admitting: Internal Medicine

## 2020-06-20 VITALS — BP 102/74 | HR 89 | Ht 76.0 in | Wt 174.3 lb

## 2020-06-20 DIAGNOSIS — I10 Essential (primary) hypertension: Secondary | ICD-10-CM | POA: Diagnosis not present

## 2020-06-20 DIAGNOSIS — I495 Sick sinus syndrome: Secondary | ICD-10-CM | POA: Diagnosis not present

## 2020-06-20 DIAGNOSIS — I4891 Unspecified atrial fibrillation: Secondary | ICD-10-CM

## 2020-06-20 DIAGNOSIS — E038 Other specified hypothyroidism: Secondary | ICD-10-CM

## 2020-06-20 DIAGNOSIS — E063 Autoimmune thyroiditis: Secondary | ICD-10-CM | POA: Diagnosis not present

## 2020-06-20 LAB — POCT INR: INR: 2.2 (ref 2.0–3.0)

## 2020-06-21 ENCOUNTER — Encounter: Payer: Self-pay | Admitting: Internal Medicine

## 2020-06-21 NOTE — Assessment & Plan Note (Signed)
Atrial fibrillation is under control. 

## 2020-06-21 NOTE — Progress Notes (Signed)
Established Patient Office Visit  Subjective:  Patient ID: Johnny Norman, male    DOB: Sep 19, 1925  Age: 87 y.o. MRN: [id number]  CC:  Chief Complaint  Patient presents with  . Atrial Fibrillation    HPI  Okley Magnussen Arciniega presents for patient is taking Coumadin regular basis, his pro time is therapeutic.  He denies any atrial fibrillation with rapid ventricular response or chest pain Past Medical History:  Diagnosis Date  . A-fib (HCC)   . CHF (congestive heart failure) (HCC)   . Claudication (HCC)    leg  . Hyperlipidemia   . Peripheral vascular disease (HCC)   . Presence of permanent cardiac pacemaker   . Small bowel obstruction (HCC)   . Thyroid disease     Past Surgical History:  Procedure Laterality Date  . APPENDECTOMY    . BOWEL RESECTION  10/12/2014   Procedure: SMALL BOWEL RESECTION;  Surgeon: Gladis Riffle, MD;  Location: [facility];  Service: General;;  . CHOLECYSTECTOMY    . COLONOSCOPY    . INSERT / REPLACE / REMOVE PACEMAKER  09/13/2008   SSS s/p pacemaker implant; [employer]tronic pacemaker,.  . LAPAROTOMY N/A 10/12/2014   Procedure: EXPLORATORY LAPAROTOMY;  Surgeon: Gladis Riffle, MD;  Location: [facility];  Service: General;  Laterality: N/A;  . PACEMAKER INSERTION N/A 12/30/2017   Procedure: GENERATOR CHANGE OUT- DUAL CHAMBER;  Surgeon: Corky Downs, MD;  Location: [facility];  Service: Cardiovascular;  Laterality: N/A;  . PROSTATE SURGERY      Family History  Family history unknown: Yes    Social History   Socioeconomic History  . Marital status: Unknown    Spouse name: Not on file  . Number of children: Not on file  . Years of education: Not on file  . Highest education level: Not on file  Occupational History  . Not on file  Tobacco Use  . Smoking status: Never Smoker  . Smokeless tobacco: Never Used  Vaping Use  . Vaping Use: Never used  Substance and Sexual Activity  . Alcohol use: No    Comment: occasional  . Drug use: No  .  Sexual activity: Yes    Birth control/protection: None  Other Topics Concern  . Not on file  Social History Narrative  . Not on file   Social Determinants of Health   Financial Resource Strain: Not on file  Food Insecurity: Not on file  Transportation Needs: Not on file  Physical Activity: Not on file  Stress: Not on file  Social Connections: Not on file  Intimate Partner Violence: Not on file     Current Outpatient Medications:  .  atorvastatin (LIPITOR) 10 MG tablet, TAKE ONE TABLET BY MOUTH EVERY DAY, Disp: 90 tablet, Rfl: 3 .  Cholecalciferol (VITAMIN D) 50 MCG (2000 UT) CAPS, Take 2,000 Units by mouth daily. , Disp: , Rfl:  .  furosemide (LASIX) 20 MG tablet, TAKE 1 TABLET BY MOUTH DAILY, Disp: 30 tablet, Rfl: 3 .  levothyroxine (SYNTHROID) 88 MCG tablet, TAKE 1 TABLET EVERY DAY ON EMPTY STOMACHWITH A GLASS OF WATER AT LEAST 30-60 MINBEFORE BREAKFAST, Disp: 90 tablet, Rfl: 3 .  lisinopril (ZESTRIL) 2.5 MG tablet, TAKE ONE TABLET EVERY DAY, Disp: 30 tablet, Rfl: 6 .  Multiple Vitamin (MULTIVITAMIN) tablet, Take 1 tablet by mouth daily., Disp: , Rfl:  .  nadolol (CORGARD) 20 MG tablet, TAKE 1 TABLET BY MOUTH DAILY, Disp: 90 tablet, Rfl: 3 .  warfarin (COUMADIN) 3 MG tablet,  TAKE ONE TABLET EVERY DAY, Disp: 30 tablet, Rfl: 6 .  warfarin (COUMADIN) 5 MG tablet, Take 5 mg by mouth daily., Disp: , Rfl:    No Known Allergies  ROS Review of Systems  Constitutional: Negative.   HENT: Negative.   Eyes: Negative.   Respiratory: Negative.   Cardiovascular: Negative.   Gastrointestinal: Negative.   Endocrine: Negative.   Skin: Negative.   All other systems reviewed and are negative.     Objective:    Physical Exam Vitals reviewed.  Constitutional:      General: He is not in acute distress.    Appearance: Normal appearance. He is normal weight. He is not diaphoretic.  HENT:     Nose: Nose normal.     Mouth/Throat:     Mouth: Mucous membranes are moist.     Pharynx:  Oropharynx is clear.  Eyes:     Extraocular Movements: Extraocular movements intact.     Conjunctiva/sclera: Conjunctivae normal.     Pupils: Pupils are equal, round, and reactive to light.  Neck:     Vascular: No carotid bruit.  Cardiovascular:     Rate and Rhythm: Normal rate and regular rhythm.     Pulses: Normal pulses.     Heart sounds: Normal heart sounds.  Pulmonary:     Effort: Pulmonary effort is normal.     Breath sounds: Normal breath sounds.  Abdominal:     General: Bowel sounds are normal.     Palpations: Abdomen is soft. There is no hepatomegaly, splenomegaly or mass.     Tenderness: There is no abdominal tenderness.     Hernia: No hernia is present.  Musculoskeletal:     Cervical back: Neck supple.     Right lower leg: No edema.     Left lower leg: No edema.  Skin:    Findings: No rash.  Neurological:     Mental Status: He is alert and oriented to person, place, and time.     Motor: No weakness.  Psychiatric:        Mood and Affect: Mood normal.        Behavior: Behavior normal.     BP 102/74   Pulse 89   Ht 6\' 4"  (1.93 m)   Wt 174 lb 4.8 oz (79.1 kg)   BMI 21.22 kg/m  Wt Readings from Last 3 Encounters:  06/20/20 174 lb 4.8 oz (79.1 kg)  05/21/20 174 lb 4.8 oz (79.1 kg)  04/16/20 178 lb 4.8 oz (80.9 kg)     Health Maintenance Due  Topic Date Due  . TETANUS/TDAP  Never done  . Zoster Vaccines- Shingrix (1 of 2) Never done  . PNA vac Low Risk Adult (1 of 2 - PCV13) Never done  . COVID-19 Vaccine (2 - Pfizer 3-dose series) 12/12/2019    There are no preventive care reminders to display for this patient.  No results found for: TSH Lab Results  Component Value Date   WBC 6.8 11/16/2019   HGB 15.3 11/16/2019   HCT 46.9 11/16/2019   MCV 91.1 11/16/2019   PLT 168 11/16/2019   Lab Results  Component Value Date   NA 139 11/16/2019   K 4.2 11/16/2019   CO2 26 11/16/2019   GLUCOSE 72 11/16/2019   BUN 19 11/16/2019   CREATININE 1.56 (H)  11/16/2019   BILITOT 1.8 (H) 11/16/2019   ALKPHOS 52 10/08/2014   AST 40 (H) 11/16/2019   ALT 35 11/16/2019   PROT 6.2  11/16/2019   ALBUMIN 3.5 10/08/2014   CALCIUM 9.2 11/16/2019   ANIONGAP 6 12/23/2017   Lab Results  Component Value Date   CHOL 159 10/12/2012   Lab Results  Component Value Date   HDL 59 10/12/2012   Lab Results  Component Value Date   LDLCALC 83 10/12/2012   Lab Results  Component Value Date   TRIG 87 10/12/2012   Lab Results  Component Value Date   CHOLHDL 2.7 10/12/2012   No results found for: HGBA1C    Assessment & Plan:   Problem List Items Addressed This Visit      Cardiovascular and Mediastinum   Atrial fibrillation (HCC) - Primary    Atrial fibrillation is under control.      Relevant Orders   POCT INR (Completed)   Sick sinus syndrome (HCC)    Patient does not have any arrhythmia or passing out spell, there is no evidence of congestive heart failure and no pedal edema no calf tenderness.  Pro time is therapeutic.  We will continue same dose of Coumadin.  He was advised to follow low-salt diet.      Essential hypertension    Patient blood pressure is normal patient denies any chest pain or shortness of breath there is no history of palpitation or paroxysmal nocturnal dyspnea   patient was advised to follow low-salt low-cholesterol diet    ideally I want to keep systolic blood pressure below 979 mmHg, patient was asked to check blood pressure one times a week and give me a report on that.  Patient will be follow-up in 3 months  or earlier as needed, patient will call me back for any change in the cardiovascular symptoms           Endocrine   Hypothyroidism    Stable at the present time.         No orders of the defined types were placed in this encounter.   Follow-up: No follow-ups on file.    Corky Downs, MD

## 2020-06-21 NOTE — Assessment & Plan Note (Signed)
Patient does not have any arrhythmia or passing out spell, there is no evidence of congestive heart failure and no pedal edema no calf tenderness.  Pro time is therapeutic.  We will continue same dose of Coumadin.  He was advised to follow low-salt diet.

## 2020-06-21 NOTE — Assessment & Plan Note (Signed)
Patient blood pressure is normal patient denies any chest pain or shortness of breath there is no history of palpitation or paroxysmal nocturnal dyspnea   patient was advised to follow low-salt low-cholesterol diet    ideally I want to keep systolic blood pressure below 130 mmHg, patient was asked to check blood pressure one times a week and give me a report on that.  Patient will be follow-up in 3 months  or earlier as needed, patient will call me back for any change in the cardiovascular symptoms    

## 2020-06-21 NOTE — Assessment & Plan Note (Signed)
Stable at the present time. 

## 2020-07-20 ENCOUNTER — Other Ambulatory Visit: Payer: Self-pay

## 2020-07-20 ENCOUNTER — Ambulatory Visit (INDEPENDENT_AMBULATORY_CARE_PROVIDER_SITE_OTHER): Payer: Medicare PPO | Admitting: Family Medicine

## 2020-07-20 VITALS — BP 125/85 | HR 85 | Ht 76.0 in | Wt 173.0 lb

## 2020-07-20 DIAGNOSIS — Z7901 Long term (current) use of anticoagulants: Secondary | ICD-10-CM | POA: Diagnosis not present

## 2020-07-20 DIAGNOSIS — I4891 Unspecified atrial fibrillation: Secondary | ICD-10-CM | POA: Diagnosis not present

## 2020-07-20 LAB — POCT INR: INR: 2.1 (ref 2.0–3.0)

## 2020-07-20 NOTE — Assessment & Plan Note (Signed)
INR wnl today, continue current therapy.

## 2020-07-26 ENCOUNTER — Encounter: Payer: Self-pay | Admitting: Family Medicine

## 2020-07-26 NOTE — Progress Notes (Signed)
Established Patient Office Visit  SUBJECTIVE:  Subjective  Patient ID: Johnny Norman, male    DOB: Feb 06, 1925  Age: 87 y.o. MRN: [id number]  CC:  Chief Complaint  Patient presents with   Atrial Fibrillation    1 month Inr check    HPI Johnny Norman is a 87 y.o. male presenting today for     Past Medical History:  Diagnosis Date   A-fib (HCC)    CHF (congestive heart failure) (HCC)    Claudication (HCC)    leg   Hyperlipidemia    Peripheral vascular disease (HCC)    Presence of permanent cardiac pacemaker    Small bowel obstruction (HCC)    Thyroid disease     Past Surgical History:  Procedure Laterality Date   APPENDECTOMY     BOWEL RESECTION  10/12/2014   Procedure: SMALL BOWEL RESECTION;  Surgeon: Gladis Riffle, MD;  Location: [facility];  Service: General;;   CHOLECYSTECTOMY     COLONOSCOPY     INSERT / REPLACE / REMOVE PACEMAKER  09/13/2008   SSS s/p pacemaker implant; Medtronic pacemaker,.   LAPAROTOMY N/A 10/12/2014   Procedure: EXPLORATORY LAPAROTOMY;  Surgeon: Gladis Riffle, MD;  Location: [facility];  Service: General;  Laterality: N/A;   PACEMAKER INSERTION N/A 12/30/2017   Procedure: GENERATOR CHANGE OUT- DUAL CHAMBER;  Surgeon: Corky Downs, MD;  Location: [facility];  Service: Cardiovascular;  Laterality: N/A;   PROSTATE SURGERY      Family History  Family history unknown: Yes    Social History   Socioeconomic History   Marital status: Unknown    Spouse name: Not on file   Number of children: Not on file   Years of education: Not on file   Highest education level: Not on file  Occupational History   Not on file  Tobacco Use   Smoking status: Never   Smokeless tobacco: Never  Vaping Use   Vaping Use: Never used  Substance and Sexual Activity   Alcohol use: No    Comment: occasional   Drug use: No   Sexual activity: Yes    Birth control/protection: None  Other Topics Concern   Not on file  Social History Narrative   Not  on file   Social Determinants of Health   Financial Resource Strain: Not on file  Food Insecurity: Not on file  Transportation Needs: Not on file  Physical Activity: Not on file  Stress: Not on file  Social Connections: Not on file  Intimate Partner Violence: Not on file     Current Outpatient Medications:    atorvastatin (LIPITOR) 10 MG tablet, TAKE ONE TABLET BY MOUTH EVERY DAY, Disp: 90 tablet, Rfl: 3   Cholecalciferol (VITAMIN D) 50 MCG (2000 UT) CAPS, Take 2,000 Units by mouth daily. , Disp: , Rfl:    furosemide (LASIX) 20 MG tablet, TAKE 1 TABLET BY MOUTH DAILY, Disp: 30 tablet, Rfl: 3   levothyroxine (SYNTHROID) 88 MCG tablet, TAKE 1 TABLET EVERY DAY ON EMPTY STOMACHWITH A GLASS OF WATER AT LEAST 30-60 MINBEFORE BREAKFAST, Disp: 90 tablet, Rfl: 3   lisinopril (ZESTRIL) 2.5 MG tablet, TAKE ONE TABLET EVERY DAY, Disp: 30 tablet, Rfl: 6   Multiple Vitamin (MULTIVITAMIN) tablet, Take 1 tablet by mouth daily., Disp: , Rfl:    nadolol (CORGARD) 20 MG tablet, TAKE 1 TABLET BY MOUTH DAILY, Disp: 90 tablet, Rfl: 3   warfarin (COUMADIN) 3 MG tablet, TAKE ONE TABLET EVERY DAY, Disp: 30 tablet,  Rfl: 6   warfarin (COUMADIN) 5 MG tablet, Take 5 mg by mouth daily., Disp: , Rfl:    No Known Allergies  ROS Review of Systems  Constitutional: Negative.   HENT: Negative.    Respiratory: Negative.    Cardiovascular: Negative.   Genitourinary: Negative.   Musculoskeletal: Negative.   Psychiatric/Behavioral: Negative.      OBJECTIVE:    Physical Exam Cardiovascular:     Rate and Rhythm: Normal rate. Rhythm irregular.  Neurological:     Mental Status: He is alert.    BP 125/85   Pulse 85   Ht 6\' 4"  (1.93 m)   Wt 173 lb (78.5 kg)   BMI 21.06 kg/m  Wt Readings from Last 3 Encounters:  07/20/20 173 lb (78.5 kg)  06/20/20 174 lb 4.8 oz (79.1 kg)  05/21/20 174 lb 4.8 oz (79.1 kg)    Health Maintenance Due  Topic Date Due   TETANUS/TDAP  Never done   Zoster Vaccines- Shingrix (1  of 2) Never done   PNA vac Low Risk Adult (1 of 2 - PCV13) Never done   COVID-19 Vaccine (2 - Pfizer series) 12/12/2019    There are no preventive care reminders to display for this patient.  CBC Latest Ref Rng & Units 11/16/2019 12/23/2017 10/16/2014  WBC 3.8 - 10.8 Thousand/uL 6.8 8.5 7.7  Hemoglobin 13.2 - 17.1 g/dL 10/18/2014 16.1 11.5(L)  Hematocrit 38.5 - 50.0 % 46.9 47.2 34.0(L)  Platelets 140 - 400 Thousand/uL 168 200 200   CMP Latest Ref Rng & Units 11/16/2019 12/23/2017 10/16/2014  Glucose 65 - 99 mg/dL 72 96 10/18/2014)  BUN 7 - 25 mg/dL 19 17 17   Creatinine 0.70 - 1.11 mg/dL 045(W) ) 0.98(J  Sodium 135 - 146 mmol/L 139 141 135  Potassium 3.5 - 5.3 mmol/L 4.2 4.8 3.3(L)  Chloride 98 - 110 mmol/L 106 107 106  CO2 20 - 32 mmol/L 26 28 24   Calcium 8.6 - 10.3 mg/dL 9.2 8.9 7.9(L)  Total Protein 6.1 - 8.1 g/dL 6.2 - -  Total Bilirubin 0.2 - 1.2 mg/dL 1.91(Y) - -  Alkaline Phos 38 - 126 U/L - - -  AST 10 - 35 U/L 40(H) - -  ALT 9 - 46 U/L 35 - -    No results found for: TSH Lab Results  Component Value Date   ALBUMIN 3.5 10/08/2014   ANIONGAP 6 12/23/2017   Lab Results  Component Value Date   CHOL 159 10/12/2012   HDL 59 10/12/2012   LDLCALC 83 10/12/2012   CHOLHDL 2.7 10/12/2012   Lab Results  Component Value Date   TRIG 87 10/12/2012   No results found for: HGBA1C    ASSESSMENT & PLAN:   Problem List Items Addressed This Visit       Cardiovascular and Mediastinum   Atrial fibrillation (HCC) - Primary   Relevant Orders   POCT INR (Completed)     Other   Encounter for current long-term use of anticoagulants    INR wnl today, continue current therapy.        No orders of the defined types were placed in this encounter.     Follow-up: No follow-ups on file.    10/14/2012, FNP [facility] [address] [place], [facility] [address]

## 2020-08-20 ENCOUNTER — Encounter: Payer: Self-pay | Admitting: Internal Medicine

## 2020-08-20 ENCOUNTER — Other Ambulatory Visit: Payer: Self-pay

## 2020-08-20 ENCOUNTER — Ambulatory Visit: Payer: Medicare PPO | Admitting: Internal Medicine

## 2020-08-20 VITALS — BP 132/90 | HR 79 | Ht 76.0 in | Wt 171.4 lb

## 2020-08-20 DIAGNOSIS — I10 Essential (primary) hypertension: Secondary | ICD-10-CM

## 2020-08-20 DIAGNOSIS — J3489 Other specified disorders of nose and nasal sinuses: Secondary | ICD-10-CM | POA: Insufficient documentation

## 2020-08-20 DIAGNOSIS — E063 Autoimmune thyroiditis: Secondary | ICD-10-CM

## 2020-08-20 DIAGNOSIS — Z1152 Encounter for screening for COVID-19: Secondary | ICD-10-CM

## 2020-08-20 DIAGNOSIS — N138 Other obstructive and reflux uropathy: Secondary | ICD-10-CM

## 2020-08-20 DIAGNOSIS — N401 Enlarged prostate with lower urinary tract symptoms: Secondary | ICD-10-CM

## 2020-08-20 DIAGNOSIS — E038 Other specified hypothyroidism: Secondary | ICD-10-CM

## 2020-08-20 DIAGNOSIS — I4891 Unspecified atrial fibrillation: Secondary | ICD-10-CM

## 2020-08-20 LAB — POCT INR: INR: 2 (ref 2.0–3.0)

## 2020-08-20 LAB — POC COVID19 BINAXNOW: SARS Coronavirus 2 Ag: NEGATIVE

## 2020-08-20 NOTE — Assessment & Plan Note (Signed)

## 2020-08-20 NOTE — Assessment & Plan Note (Signed)
Denies any dysuria or fever

## 2020-08-20 NOTE — Assessment & Plan Note (Signed)
Stable at the present time. 

## 2020-08-20 NOTE — Assessment & Plan Note (Signed)
Atrial fibrillation is under control.  Patient denies any chest pain or shortness of breath.  Pro time is therapeutic

## 2020-08-20 NOTE — Progress Notes (Signed)
Established Patient Office Visit  Subjective:  Patient ID: Johnny Norman, male    DOB: 1925-12-16  Age: 87 y.o. MRN: [id number]  CC:  Chief Complaint  Patient presents with   Atrial Fibrillation    Atrial Fibrillation Past medical history includes atrial fibrillation.   Johnny Norman presents for patient is taking Coumadin regular basis, his pro time is therapeutic.  He denies any atrial fibrillation with rapid ventricular response or chest pain, patient also complains of rhinorrhea, he has been exposed to COVID-patient.  We will schedule a COVID test on him.  He is not sick except for nasal rhinorrhea he does not smoke does not drink.  Contact Past Medical History:  Diagnosis Date   A-fib [facility])    CHF (congestive heart failure) (HCC)    Claudication (HCC)    leg   Hyperlipidemia    Peripheral vascular disease (HCC)    Presence of permanent cardiac pacemaker    Small bowel obstruction (HCC)    Thyroid disease     Past Surgical History:  Procedure Laterality Date   APPENDECTOMY     BOWEL RESECTION  10/12/2014   Procedure: SMALL BOWEL RESECTION;  Surgeon: Gladis Riffle, MD;  Location: [facility];  Service: General;;   CHOLECYSTECTOMY     COLONOSCOPY     INSERT / REPLACE / REMOVE PACEMAKER  09/13/2008   SSS s/p pacemaker implant; Medtronic pacemaker,.   LAPAROTOMY N/A 10/12/2014   Procedure: EXPLORATORY LAPAROTOMY;  Surgeon: Gladis Riffle, MD;  Location: [facility];  Service: General;  Laterality: N/A;   PACEMAKER INSERTION N/A 12/30/2017   Procedure: GENERATOR CHANGE OUT- DUAL CHAMBER;  Surgeon: Corky Downs, MD;  Location: [facility];  Service: Cardiovascular;  Laterality: N/A;   PROSTATE SURGERY      Family History  Family history unknown: Yes    Social History   Socioeconomic History   Marital status: Unknown    Spouse name: Not on file   Number of children: Not on file   Years of education: Not on file   Highest education level: Not on file   Occupational History   Not on file  Tobacco Use   Smoking status: Never   Smokeless tobacco: Never  Vaping Use   Vaping Use: Never used  Substance and Sexual Activity   Alcohol use: No    Comment: occasional   Drug use: No   Sexual activity: Yes    Birth control/protection: None  Other Topics Concern   Not on file  Social History Narrative   Not on file   Social Determinants of Health   Financial Resource Strain: Not on file  Food Insecurity: Not on file  Transportation Needs: Not on file  Physical Activity: Not on file  Stress: Not on file  Social Connections: Not on file  Intimate Partner Violence: Not on file     Current Outpatient Medications:    atorvastatin (LIPITOR) 10 MG tablet, TAKE ONE TABLET BY MOUTH EVERY DAY, Disp: 90 tablet, Rfl: 3   Cholecalciferol (VITAMIN D) 50 MCG (2000 UT) CAPS, Take 2,000 Units by mouth daily. , Disp: , Rfl:    furosemide (LASIX) 20 MG tablet, TAKE 1 TABLET BY MOUTH DAILY, Disp: 30 tablet, Rfl: 3   levothyroxine (SYNTHROID) 88 MCG tablet, TAKE 1 TABLET EVERY DAY ON EMPTY STOMACHWITH A GLASS OF WATER AT LEAST 30-60 MINBEFORE BREAKFAST, Disp: 90 tablet, Rfl: 3   lisinopril (ZESTRIL) 2.5 MG tablet, TAKE ONE TABLET EVERY DAY, Disp: 30  tablet, Rfl: 6   Multiple Vitamin (MULTIVITAMIN) tablet, Take 1 tablet by mouth daily., Disp: , Rfl:    nadolol (CORGARD) 20 MG tablet, TAKE 1 TABLET BY MOUTH DAILY, Disp: 90 tablet, Rfl: 3   warfarin (COUMADIN) 3 MG tablet, TAKE ONE TABLET EVERY DAY, Disp: 30 tablet, Rfl: 6   warfarin (COUMADIN) 5 MG tablet, Take 5 mg by mouth daily., Disp: , Rfl:    No Known Allergies  ROS Review of Systems  Constitutional: Negative.  Negative for chills and fatigue.  HENT:  Positive for hearing loss and rhinorrhea.   Eyes: Negative.   Respiratory: Negative.    Cardiovascular: Negative.   Gastrointestinal: Negative.   Endocrine: Negative.   Skin: Negative.   All other systems reviewed and are negative.     Objective:    Physical Exam Vitals reviewed.  Constitutional:      General: He is not in acute distress.    Appearance: Normal appearance. He is normal weight. He is not diaphoretic.  HENT:     Nose: Nose normal.     Mouth/Throat:     Mouth: Mucous membranes are moist.     Pharynx: Oropharynx is clear.  Eyes:     Extraocular Movements: Extraocular movements intact.     Conjunctiva/sclera: Conjunctivae normal.     Pupils: Pupils are equal, round, and reactive to light.  Neck:     Vascular: No carotid bruit.  Cardiovascular:     Rate and Rhythm: Normal rate and regular rhythm.     Pulses: Normal pulses.     Heart sounds: Normal heart sounds.  Pulmonary:     Effort: Pulmonary effort is normal.     Breath sounds: Normal breath sounds.  Abdominal:     General: Bowel sounds are normal.     Palpations: Abdomen is soft. There is no hepatomegaly, splenomegaly or mass.     Tenderness: There is no abdominal tenderness.     Hernia: No hernia is present.  Musculoskeletal:     Cervical back: Neck supple.     Right lower leg: No edema.     Left lower leg: No edema.  Skin:    Findings: No rash.  Neurological:     Mental Status: He is alert and oriented to person, place, and time.     Motor: No weakness.  Psychiatric:        Mood and Affect: Mood normal.        Behavior: Behavior normal.    BP 132/90   Pulse 79   Ht 6\' 4"  (1.93 m)   Wt 171 lb 6.4 oz (77.7 kg)   BMI 20.86 kg/m  Wt Readings from Last 3 Encounters:  08/20/20 171 lb 6.4 oz (77.7 kg)  07/20/20 173 lb (78.5 kg)  06/20/20 174 lb 4.8 oz (79.1 kg)     Health Maintenance Due  Topic Date Due   TETANUS/TDAP  Never done   Zoster Vaccines- Shingrix (1 of 2) Never done   PNA vac Low Risk Adult (1 of 2 - PCV13) Never done   COVID-19 Vaccine (4 - Booster for Pfizer series) 03/23/2020    There are no preventive care reminders to display for this patient.  No results found for: TSH Lab Results  Component Value Date    WBC 6.8 11/16/2019   HGB 15.3 11/16/2019   HCT 46.9 11/16/2019   MCV 91.1 11/16/2019   PLT 168 11/16/2019   Lab Results  Component Value Date   NA 139  11/16/2019   K 4.2 11/16/2019   CO2 26 11/16/2019   GLUCOSE 72 11/16/2019   BUN 19 11/16/2019   CREATININE 1.56 (H) 11/16/2019   BILITOT 1.8 (H) 11/16/2019   ALKPHOS 52 10/08/2014   AST 40 (H) 11/16/2019   ALT 35 11/16/2019   PROT 6.2 11/16/2019   ALBUMIN 3.5 10/08/2014   CALCIUM 9.2 11/16/2019   ANIONGAP 6 12/23/2017   Lab Results  Component Value Date   CHOL 159 10/12/2012   Lab Results  Component Value Date   HDL 59 10/12/2012   Lab Results  Component Value Date   LDLCALC 83 10/12/2012   Lab Results  Component Value Date   TRIG 87 10/12/2012   Lab Results  Component Value Date   CHOLHDL 2.7 10/12/2012   No results found for: HGBA1C    Assessment & Plan:   Problem List Items Addressed This Visit       Cardiovascular and Mediastinum   Atrial fibrillation (HCC) - Primary    Atrial fibrillation is under control.  Patient denies any chest pain or shortness of breath.  Pro time is therapeutic       Relevant Orders   POCT INR (Completed)   Essential hypertension     Patient denies any chest pain or shortness of breath there is no history of palpitation or paroxysmal nocturnal dyspnea   patient was advised to follow low-salt low-cholesterol diet    ideally I want to keep systolic blood pressure below 809 mmHg, patient was asked to check blood pressure one times a week and give me a report on that.  Patient will be follow-up in 3 months  or earlier as needed, patient will call me back for any change in the cardiovascular symptoms Patient was advised to buy a book from local bookstore concerning blood pressure and read several chapters  every day.  This will be supplemented by some of the material we will give him from the office.  Patient should also utilize other resources like YouTube and Internet to learn  more about the blood pressure and the diet.         Endocrine   Hypothyroidism    Stable at the present time         Genitourinary   Benign prostatic hyperplasia with urinary obstruction    Denies any dysuria or fever         Other   Rhinorrhea    Patient complains of rhinorrhea he has been exposed to COVID patient.  So we will check COVID test on him.  Give him Claritin 5 mg daily     a prescription was given to him for the.        No orders of the defined types were placed in this encounter.   Follow-up: No follow-ups on file.  COVID test is negative.  Corky Downs, MD

## 2020-08-20 NOTE — Addendum Note (Signed)
Addended by: Jobie Quaker on: 08/20/2020 11:38 AM   Modules accepted: Orders

## 2020-08-20 NOTE — Assessment & Plan Note (Signed)
Patient complains of rhinorrhea he has been exposed to COVID patient.  So we will check COVID test on him.  Give him Claritin 5 mg daily     a prescription was given to him for the.

## 2020-09-01 ENCOUNTER — Other Ambulatory Visit: Payer: Self-pay | Admitting: Internal Medicine

## 2020-09-03 ENCOUNTER — Other Ambulatory Visit: Payer: Self-pay

## 2020-09-03 ENCOUNTER — Ambulatory Visit (INDEPENDENT_AMBULATORY_CARE_PROVIDER_SITE_OTHER): Payer: Medicare PPO | Admitting: Internal Medicine

## 2020-09-03 VITALS — BP 110/71 | HR 87

## 2020-09-03 DIAGNOSIS — E782 Mixed hyperlipidemia: Secondary | ICD-10-CM

## 2020-09-03 DIAGNOSIS — Z950 Presence of cardiac pacemaker: Secondary | ICD-10-CM | POA: Diagnosis not present

## 2020-09-03 DIAGNOSIS — I4891 Unspecified atrial fibrillation: Secondary | ICD-10-CM

## 2020-09-03 DIAGNOSIS — I495 Sick sinus syndrome: Secondary | ICD-10-CM | POA: Diagnosis not present

## 2020-09-03 DIAGNOSIS — I10 Essential (primary) hypertension: Secondary | ICD-10-CM

## 2020-09-03 NOTE — Assessment & Plan Note (Addendum)
Stable at the present time. 

## 2020-09-03 NOTE — Progress Notes (Signed)
Established Patient Office Visit  Subjective:  Patient ID: Johnny Norman, male    DOB: 11-24-1925  Age: 87 y.o. MRN: [id number]  CC:  Chief Complaint  Patient presents with   Pacemaker Check     HPI  Johnny Norman presents for pacer check Past Medical History:  Diagnosis Date   A-fib [facility])    CHF (congestive heart failure) (HCC)    Claudication (HCC)    leg   Hyperlipidemia    Peripheral vascular disease (HCC)    Presence of permanent cardiac pacemaker    Small bowel obstruction (HCC)    Thyroid disease     Past Surgical History:  Procedure Laterality Date   APPENDECTOMY     BOWEL RESECTION  10/12/2014   Procedure: SMALL BOWEL RESECTION;  Surgeon: Gladis Riffle, MD;  Location: [facility] ORS;  Service: General;;   CHOLECYSTECTOMY     COLONOSCOPY     INSERT / REPLACE / REMOVE PACEMAKER  09/13/2008   SSS s/p pacemaker implant; Medtronic pacemaker,.   LAPAROTOMY N/A 10/12/2014   Procedure: EXPLORATORY LAPAROTOMY;  Surgeon: Gladis Riffle, MD;  Location: [facility];  Service: General;  Laterality: N/A;   PACEMAKER INSERTION N/A 12/30/2017   Procedure: GENERATOR CHANGE OUT- DUAL CHAMBER;  Surgeon: Corky Downs, MD;  Location: [facility];  Service: Cardiovascular;  Laterality: N/A;   PROSTATE SURGERY      Family History  Family history unknown: Yes    Social History   Socioeconomic History   Marital status: Unknown    Spouse name: Not on file   Number of children: Not on file   Years of education: Not on file   Highest education level: Not on file  Occupational History   Not on file  Tobacco Use   Smoking status: Never   Smokeless tobacco: Never  Vaping Use   Vaping Use: Never used  Substance and Sexual Activity   Alcohol use: No    Comment: occasional   Drug use: No   Sexual activity: Yes    Birth control/protection: None  Other Topics Concern   Not on file  Social History Narrative   Not on file   Social Determinants of Health   Financial  Resource Strain: Not on file  Food Insecurity: Not on file  Transportation Needs: Not on file  Physical Activity: Not on file  Stress: Not on file  Social Connections: Not on file  Intimate Partner Violence: Not on file     Current Outpatient Medications:    atorvastatin (LIPITOR) 10 MG tablet, TAKE ONE TABLET BY MOUTH EVERY DAY, Disp: 90 tablet, Rfl: 3   Cholecalciferol (VITAMIN D) 50 MCG (2000 UT) CAPS, Take 2,000 Units by mouth daily. , Disp: , Rfl:    furosemide (LASIX) 20 MG tablet, TAKE 1 TABLET BY MOUTH DAILY, Disp: 30 tablet, Rfl: 3   levothyroxine (SYNTHROID) 88 MCG tablet, TAKE 1 TABLET EVERY DAY ON EMPTY STOMACHWITH A GLASS OF WATER AT LEAST 30-60 MINBEFORE BREAKFAST, Disp: 90 tablet, Rfl: 3   lisinopril (ZESTRIL) 2.5 MG tablet, TAKE ONE TABLET EVERY DAY, Disp: 30 tablet, Rfl: 6   Multiple Vitamin (MULTIVITAMIN) tablet, Take 1 tablet by mouth daily., Disp: , Rfl:    nadolol (CORGARD) 20 MG tablet, TAKE 1 TABLET BY MOUTH DAILY, Disp: 90 tablet, Rfl: 3   warfarin (COUMADIN) 3 MG tablet, TAKE ONE TABLET EVERY DAY, Disp: 30 tablet, Rfl: 6   warfarin (COUMADIN) 5 MG tablet, Take 5 mg by mouth daily.,  Disp: , Rfl:    No Known Allergies  ROS Review of Systems  Constitutional: Negative.   HENT: Negative.    Eyes: Negative.   Respiratory: Negative.    Cardiovascular: Negative.   Gastrointestinal: Negative.   Endocrine: Negative.   Genitourinary: Negative.   Musculoskeletal: Negative.   Skin: Negative.   Allergic/Immunologic: Negative.   Neurological: Negative.   Hematological: Negative.   Psychiatric/Behavioral: Negative.    All other systems reviewed and are negative.    Objective:    Physical Exam Vitals reviewed.  Constitutional:      Appearance: Normal appearance.  HENT:     Mouth/Throat:     Mouth: Mucous membranes are moist.  Eyes:     Pupils: Pupils are equal, round, and reactive to light.  Neck:     Vascular: No carotid bruit.  Cardiovascular:      Rate and Rhythm: Normal rate and regular rhythm.     Pulses: Normal pulses.     Heart sounds: Normal heart sounds.  Pulmonary:     Effort: Pulmonary effort is normal.     Breath sounds: Normal breath sounds.  Abdominal:     General: Bowel sounds are normal.     Palpations: Abdomen is soft. There is no hepatomegaly, splenomegaly or mass.     Tenderness: There is no abdominal tenderness.     Hernia: No hernia is present.  Musculoskeletal:     Cervical back: Neck supple.     Right lower leg: No edema.     Left lower leg: No edema.  Skin:    Findings: No rash.  Neurological:     Mental Status: He is alert and oriented to person, place, and time.     Motor: No weakness.  Psychiatric:        Mood and Affect: Mood normal.        Behavior: Behavior normal.    BP 110/71   Pulse 87  Wt Readings from Last 3 Encounters:  08/20/20 171 lb 6.4 oz (77.7 kg)  07/20/20 173 lb (78.5 kg)  06/20/20 174 lb 4.8 oz (79.1 kg)     Health Maintenance Due  Topic Date Due   TETANUS/TDAP  Never done   Zoster Vaccines- Shingrix (1 of 2) Never done   PNA vac Low Risk Adult (1 of 2 - PCV13) Never done   COVID-19 Vaccine (4 - Booster for Pfizer series) 03/23/2020   INFLUENZA VACCINE  08/27/2020    There are no preventive care reminders to display for this patient.  No results found for: TSH Lab Results  Component Value Date   WBC 6.8 11/16/2019   HGB 15.3 11/16/2019   HCT 46.9 11/16/2019   MCV 91.1 11/16/2019   PLT 168 11/16/2019   Lab Results  Component Value Date   NA 139 11/16/2019   K 4.2 11/16/2019   CO2 26 11/16/2019   GLUCOSE 72 11/16/2019   BUN 19 11/16/2019   CREATININE 1.56 (H) 11/16/2019   BILITOT 1.8 (H) 11/16/2019   ALKPHOS 52 10/08/2014   AST 40 (H) 11/16/2019   ALT 35 11/16/2019   PROT 6.2 11/16/2019   ALBUMIN 3.5 10/08/2014   CALCIUM 9.2 11/16/2019   ANIONGAP 6 12/23/2017   Lab Results  Component Value Date   CHOL 159 10/12/2012   Lab Results  Component Value  Date   HDL 59 10/12/2012   Lab Results  Component Value Date   LDLCALC 83 10/12/2012   Lab Results  Component Value Date  TRIG 87 10/12/2012   Lab Results  Component Value Date   CHOLHDL 2.7 10/12/2012   No results found for: HGBA1C    Assessment & Plan:   Problem List Items Addressed This Visit       Cardiovascular and Mediastinum   Atrial fibrillation (HCC) - Primary     Stable at the present time      Sick sinus syndrome [facility])    Patient does not have any syncope.      Essential hypertension     Patient denies any chest pain or shortness of breath there is no history of palpitation or paroxysmal nocturnal dyspnea   patient was advised to follow low-salt low-cholesterol diet    ideally I want to keep systolic blood pressure below 373 mmHg, patient was asked to check blood pressure one times a week and give me a report on that.  Patient will be follow-up in 3 months  or earlier as needed, patient will call me back for any change in the cardiovascular symptoms Patient was advised to buy a book from local bookstore concerning blood pressure and read several chapters  every day.  This will be supplemented by some of the material we will give him from the office.  Patient should also utilize other resources like YouTube and Internet to learn more about the blood pressure and the diet.        Other   Hyperlipidemia    Hypercholesterolemia  I advised the patient to follow Mediterranean diet This diet is rich in fruits vegetables and whole grain, and This diet is also rich in fish and lean meat Patient should also eat a handful of almonds or walnuts daily Recent heart study indicated that average follow-up on this kind of diet reduces the cardiovascular mortality by 50 to 70%==      Presence of permanent cardiac pacemaker    Pacemaker is working well.      Other Visit Diagnoses     Cardiac pacemaker in situ       Relevant Orders   PACEMAKER IN CLINIC CHECK        No orders of the defined types were placed in this encounter.  Note: Medical Device Follow-up  Patient pacemaker was interrogated by pacemakers analyzer, battery status is okay.  No programming changes were indicated after the review of the data.  Histogram shows no change since the last interrogation Atrial and ventricular sensing thresholds were found to be acceptable Impedance was checked and it was found to be normal.  Thresholds were found to be okay on evaluation of rhythm problem.  No high rate or low rate arrhythmia were noted.  Estimated battery longevity is more than months. I have personally reviewed the device data and amended the report as necessary.              Follow-up: Return in about 3 months (around 12/04/2020).    Corky Downs, MD

## 2020-09-03 NOTE — Assessment & Plan Note (Signed)
Pacemaker is working well 

## 2020-09-03 NOTE — Assessment & Plan Note (Signed)
Patient does not have any syncope.

## 2020-09-03 NOTE — Assessment & Plan Note (Signed)

## 2020-09-03 NOTE — Assessment & Plan Note (Signed)
Hypercholesterolemia  I advised the patient to follow Mediterranean diet This diet is rich in fruits vegetables and whole grain, and This diet is also rich in fish and lean meat Patient should also eat a handful of almonds or walnuts daily Recent heart study indicated that average follow-up on this kind of diet reduces the cardiovascular mortality by 50 to 70%== 

## 2020-09-24 ENCOUNTER — Other Ambulatory Visit: Payer: Self-pay

## 2020-09-24 ENCOUNTER — Ambulatory Visit: Payer: Medicare PPO | Admitting: Internal Medicine

## 2020-09-24 ENCOUNTER — Encounter: Payer: Self-pay | Admitting: Internal Medicine

## 2020-09-24 VITALS — BP 126/86 | HR 87 | Ht 76.0 in | Wt 176.6 lb

## 2020-09-24 DIAGNOSIS — Z23 Encounter for immunization: Secondary | ICD-10-CM | POA: Diagnosis not present

## 2020-09-24 DIAGNOSIS — E038 Other specified hypothyroidism: Secondary | ICD-10-CM

## 2020-09-24 DIAGNOSIS — I4891 Unspecified atrial fibrillation: Secondary | ICD-10-CM | POA: Diagnosis not present

## 2020-09-24 DIAGNOSIS — E063 Autoimmune thyroiditis: Secondary | ICD-10-CM | POA: Diagnosis not present

## 2020-09-24 DIAGNOSIS — N138 Other obstructive and reflux uropathy: Secondary | ICD-10-CM | POA: Diagnosis not present

## 2020-09-24 DIAGNOSIS — N401 Enlarged prostate with lower urinary tract symptoms: Secondary | ICD-10-CM | POA: Diagnosis not present

## 2020-09-24 DIAGNOSIS — I10 Essential (primary) hypertension: Secondary | ICD-10-CM | POA: Diagnosis not present

## 2020-09-24 LAB — POCT INR: INR: 2.5 (ref 2.0–3.0)

## 2020-09-24 NOTE — Progress Notes (Signed)
Established Patient Office Visit  Subjective:  Patient ID: Johnny Norman, male    DOB: 02/23/1925  Age: 87 y.o. MRN: [id number]  CC:  Chief Complaint  Patient presents with   Atrial Fibrillation    Atrial Fibrillation Past medical history includes atrial fibrillation.   Caeleb Alden Server Neal presents for protime check  Past Medical History:  Diagnosis Date   A-fib [facility])    CHF (congestive heart failure) (HCC)    Claudication (HCC)    leg   Hyperlipidemia    Peripheral vascular disease (HCC)    Presence of permanent cardiac pacemaker    Small bowel obstruction (HCC)    Thyroid disease     Past Surgical History:  Procedure Laterality Date   APPENDECTOMY     BOWEL RESECTION  10/12/2014   Procedure: SMALL BOWEL RESECTION;  Surgeon: Gladis Riffle, MD;  Location: [facility] ORS;  Service: General;;   CHOLECYSTECTOMY     COLONOSCOPY     INSERT / REPLACE / REMOVE PACEMAKER  09/13/2008   SSS s/p pacemaker implant; Medtronic pacemaker,.   LAPAROTOMY N/A 10/12/2014   Procedure: EXPLORATORY LAPAROTOMY;  Surgeon: Gladis Riffle, MD;  Location: [facility] OR[facility];  Service: General;  Laterality: N/A;   PACEMAKER INSERTION N/A 12/30/2017   Procedure: GENERATOR CHANGE OUT- DUAL CHAMBER;  Surgeon: Corky Downs, MD;  Location: [facility] ORS;  Service: Cardiovascular;  Laterality: N/A;   PROSTATE SURGERY      Family History  Family history unknown: Yes    Social History   Socioeconomic History   Marital status: Unknown    Spouse name: Not on file   Number of children: Not on file   Years of education: Not on file   Highest education level: Not on file  Occupational History   Not on file  Tobacco Use   Smoking status: Never   Smokeless tobacco: Never  Vaping Use   Vaping Use: Never used  Substance and Sexual Activity   Alcohol use: No    Comment: occasional   Drug use: No   Sexual activity: Yes    Birth control/protection: None  Other Topics Concern   Not on file   Social History Narrative   Not on file   Social Determinants of Health   Financial Resource Strain: Not on file  Food Insecurity: Not on file  Transportation Needs: Not on file  Physical Activity: Not on file  Stress: Not on file  Social Connections: Not on file  Intimate Partner Violence: Not on file     Current Outpatient Medications:    atorvastatin (LIPITOR) 10 MG tablet, TAKE ONE TABLET BY MOUTH EVERY DAY, Disp: 90 tablet, Rfl: 3   Cholecalciferol (VITAMIN D) 50 MCG (2000 UT) CAPS, Take 2,000 Units by mouth daily. , Disp: , Rfl:    furosemide (LASIX) 20 MG tablet, TAKE 1 TABLET BY MOUTH DAILY, Disp: 30 tablet, Rfl: 3   levothyroxine (SYNTHROID) 88 MCG tablet, TAKE 1 TABLET EVERY DAY ON EMPTY STOMACHWITH A GLASS OF WATER AT LEAST 30-60 MINBEFORE BREAKFAST, Disp: 90 tablet, Rfl: 3   lisinopril (ZESTRIL) 2.5 MG tablet, TAKE ONE TABLET EVERY DAY, Disp: 30 tablet, Rfl: 6   Multiple Vitamin (MULTIVITAMIN) tablet, Take 1 tablet by mouth daily., Disp: , Rfl:    nadolol (CORGARD) 20 MG tablet, TAKE 1 TABLET BY MOUTH DAILY, Disp: 90 tablet, Rfl: 3   warfarin (COUMADIN) 3 MG tablet, TAKE ONE TABLET EVERY DAY, Disp: 30 tablet, Rfl: 6   warfarin (COUMADIN) 5  MG tablet, Take 5 mg by mouth daily., Disp: , Rfl:    No Known Allergies  ROS Review of Systems  Constitutional: Negative.   HENT: Negative.    Eyes: Negative.   Respiratory: Negative.    Cardiovascular: Negative.   Gastrointestinal: Negative.   Endocrine: Negative.   Genitourinary: Negative.   Musculoskeletal: Negative.   Skin: Negative.   Allergic/Immunologic: Negative.   Neurological: Negative.   Hematological: Negative.   Psychiatric/Behavioral: Negative.    All other systems reviewed and are negative.    Objective:    Physical Exam Vitals reviewed.  Constitutional:      Appearance: Normal appearance.  HENT:     Mouth/Throat:     Mouth: Mucous membranes are moist.  Eyes:     Pupils: Pupils are equal, round,  and reactive to light.  Neck:     Vascular: No carotid bruit.  Cardiovascular:     Rate and Rhythm: Normal rate and regular rhythm.     Pulses: Normal pulses.     Heart sounds: Normal heart sounds.  Pulmonary:     Effort: Pulmonary effort is normal.     Breath sounds: Normal breath sounds.  Abdominal:     General: Bowel sounds are normal.     Palpations: Abdomen is soft. There is no hepatomegaly, splenomegaly or mass.     Tenderness: There is no abdominal tenderness.     Hernia: No hernia is present.  Musculoskeletal:     Cervical back: Neck supple.     Right lower leg: No edema.     Left lower leg: No edema.  Skin:    Findings: No rash.  Neurological:     Mental Status: He is alert and oriented to person, place, and time.     Motor: No weakness.  Psychiatric:        Mood and Affect: Mood normal.        Behavior: Behavior normal.    BP 126/86   Pulse 87   Ht 6\' 4"  (1.93 m)   Wt 176 lb 9.6 oz (80.1 kg)   BMI 21.50 kg/m  Wt Readings from Last 3 Encounters:  09/24/20 176 lb 9.6 oz (80.1 kg)  08/20/20 171 lb 6.4 oz (77.7 kg)  07/20/20 173 lb (78.5 kg)     Health Maintenance Due  Topic Date Due   TETANUS/TDAP  Never done   Zoster Vaccines- Shingrix (1 of 2) Never done   PNA vac Low Risk Adult (1 of 2 - PCV13) Never done   COVID-19 Vaccine (4 - Booster for Pfizer series) 03/23/2020   INFLUENZA VACCINE  08/27/2020    There are no preventive care reminders to display for this patient.  No results found for: TSH Lab Results  Component Value Date   WBC 6.8 11/16/2019   HGB 15.3 11/16/2019   HCT 46.9 11/16/2019   MCV 91.1 11/16/2019   PLT 168 11/16/2019   Lab Results  Component Value Date   NA 139 11/16/2019   K 4.2 11/16/2019   CO2 26 11/16/2019   GLUCOSE 72 11/16/2019   BUN 19 11/16/2019   CREATININE 1.56 (H) 11/16/2019   BILITOT 1.8 (H) 11/16/2019   ALKPHOS 52 10/08/2014   AST 40 (H) 11/16/2019   ALT 35 11/16/2019   PROT 6.2 11/16/2019   ALBUMIN 3.5  10/08/2014   CALCIUM 9.2 11/16/2019   ANIONGAP 6 12/23/2017   Lab Results  Component Value Date   CHOL 159 10/12/2012   Lab Results  Component Value Date   HDL 59 10/12/2012   Lab Results  Component Value Date   LDLCALC 83 10/12/2012   Lab Results  Component Value Date   TRIG 87 10/12/2012   Lab Results  Component Value Date   CHOLHDL 2.7 10/12/2012   No results found for: HGBA1C    Assessment & Plan:   Problem List Items Addressed This Visit       Cardiovascular and Mediastinum   Atrial fibrillation (HCC) - Primary    Atrial fibrillation is under control      Relevant Orders   POCT INR (Completed)   Essential hypertension     Patient denies any chest pain or shortness of breath there is no history of palpitation or paroxysmal nocturnal dyspnea   patient was advised to follow low-salt low-cholesterol diet    ideally I want to keep systolic blood pressure below 330 mmHg, patient was asked to check blood pressure one times a week and give me a report on that.  Patient will be follow-up in 3 months  or earlier as needed, patient will call me back for any change in the cardiovascular symptoms Patient was advised to buy a book from local bookstore concerning blood pressure and read several chapters  every day.  This will be supplemented by some of the material we will give him from the office.  Patient should also utilize other resources like YouTube and Internet to learn more about the blood pressure and the diet.        Endocrine   Hypothyroidism    Patient was advised to take his medicine regularly in the morning before breakfast        Genitourinary   Benign prostatic hyperplasia with urinary obstruction    Under control     Pro time is therapeutic  No orders of the defined types were placed in this encounter.   Follow-up: No follow-ups on file.    Corky Downs, MD

## 2020-09-24 NOTE — Assessment & Plan Note (Signed)
Atrial fibrillation is under control. 

## 2020-09-24 NOTE — Assessment & Plan Note (Signed)
Under control 

## 2020-09-24 NOTE — Assessment & Plan Note (Signed)

## 2020-09-24 NOTE — Assessment & Plan Note (Signed)
Patient was advised to take his medicine regularly in the morning before breakfast

## 2020-09-27 ENCOUNTER — Other Ambulatory Visit: Payer: Self-pay | Admitting: Internal Medicine

## 2020-10-29 ENCOUNTER — Other Ambulatory Visit: Payer: Self-pay

## 2020-10-29 ENCOUNTER — Encounter: Payer: Self-pay | Admitting: Internal Medicine

## 2020-10-29 ENCOUNTER — Ambulatory Visit: Payer: Medicare PPO | Admitting: Internal Medicine

## 2020-10-29 ENCOUNTER — Ambulatory Visit (INDEPENDENT_AMBULATORY_CARE_PROVIDER_SITE_OTHER): Payer: Medicare PPO | Admitting: Internal Medicine

## 2020-10-29 VITALS — BP 130/86 | HR 71 | Ht 76.0 in | Wt 176.7 lb

## 2020-10-29 DIAGNOSIS — E063 Autoimmune thyroiditis: Secondary | ICD-10-CM | POA: Diagnosis not present

## 2020-10-29 DIAGNOSIS — E038 Other specified hypothyroidism: Secondary | ICD-10-CM | POA: Diagnosis not present

## 2020-10-29 DIAGNOSIS — Z0000 Encounter for general adult medical examination without abnormal findings: Secondary | ICD-10-CM

## 2020-10-29 DIAGNOSIS — Z950 Presence of cardiac pacemaker: Secondary | ICD-10-CM | POA: Diagnosis not present

## 2020-10-29 DIAGNOSIS — I495 Sick sinus syndrome: Secondary | ICD-10-CM

## 2020-10-29 DIAGNOSIS — I4891 Unspecified atrial fibrillation: Secondary | ICD-10-CM

## 2020-10-29 DIAGNOSIS — E782 Mixed hyperlipidemia: Secondary | ICD-10-CM

## 2020-10-29 DIAGNOSIS — I10 Essential (primary) hypertension: Secondary | ICD-10-CM | POA: Diagnosis not present

## 2020-10-29 LAB — POCT INR: INR: 1.3 — AB (ref 2.0–3.0)

## 2020-10-29 NOTE — Assessment & Plan Note (Signed)
Hypercholesterolemia  I advised the patient to follow Mediterranean diet This diet is rich in fruits vegetables and whole grain, and This diet is also rich in fish and lean meat Patient should also eat a handful of almonds or walnuts daily Recent heart study indicated that average follow-up on this kind of diet reduces the cardiovascular mortality by 50 to 70%== 

## 2020-10-29 NOTE — Assessment & Plan Note (Signed)
No episode of syncope or tacky arrhythmia.

## 2020-10-29 NOTE — Assessment & Plan Note (Signed)
Blood pressure is under control on the present medication. 

## 2020-10-29 NOTE — Assessment & Plan Note (Signed)
Patient get his pacemaker checked every 3 months.

## 2020-10-29 NOTE — Assessment & Plan Note (Signed)
Stable at the present time. 

## 2020-10-29 NOTE — Assessment & Plan Note (Signed)
Atrial fibrillation is stable at the present time pro time is therapeutic.  There is no evidence of any bleeding or black stool.

## 2020-10-29 NOTE — Progress Notes (Signed)
Established Patient Office Visit  Subjective:  Patient ID: Johnny Norman, male    DOB: 02/01/1925  Age: 87 y.o. MRN: [id number]  CC:  Chief Complaint  Patient presents with   Annual Exam    HPI  Johnny Norman presents for physical  Past Medical History:  Diagnosis Date   A-fib [facility])    CHF (congestive heart failure) (HCC)    Claudication (HCC)    leg   Hyperlipidemia    Peripheral vascular disease (HCC)    Presence of permanent cardiac pacemaker    Small bowel obstruction (HCC)    Thyroid disease     Past Surgical History:  Procedure Laterality Date   APPENDECTOMY     BOWEL RESECTION  10/12/2014   Procedure: SMALL BOWEL RESECTION;  Surgeon: Gladis Riffle, MD;  Location: [facility] OR[facility];  Service: General;;   CHOLECYSTECTOMY     COLONOSCOPY     INSERT / REPLACE / REMOVE PACEMAKER  09/13/2008   SSS s/p pacemaker implant; Medtronic pacemaker,.   LAPAROTOMY N/A 10/12/2014   Procedure: EXPLORATORY LAPAROTOMY;  Surgeon: Gladis Riffle, MD;  Location: [facility];  Service: General;  Laterality: N/A;   PACEMAKER INSERTION N/A 12/30/2017   Procedure: GENERATOR CHANGE OUT- DUAL CHAMBER;  Surgeon: Corky Downs, MD;  Location: [facility] ORS;  Service: Cardiovascular;  Laterality: N/A;   PROSTATE SURGERY      Family History  Family history unknown: Yes    Social History   Socioeconomic History   Marital status: Unknown    Spouse name: Not on file   Number of children: Not on file   Years of education: Not on file   Highest education level: Not on file  Occupational History   Not on file  Tobacco Use   Smoking status: Never   Smokeless tobacco: Never  Vaping Use   Vaping Use: Never used  Substance and Sexual Activity   Alcohol use: No    Comment: occasional   Drug use: No   Sexual activity: Yes    Birth control/protection: None  Other Topics Concern   Not on file  Social History Narrative   Not on file   Social Determinants of Health    Financial Resource Strain: Not on file  Food Insecurity: Not on file  Transportation Needs: Not on file  Physical Activity: Not on file  Stress: Not on file  Social Connections: Not on file  Intimate Partner Violence: Not on file     Current Outpatient Medications:    atorvastatin (LIPITOR) 10 MG tablet, TAKE ONE TABLET BY MOUTH EVERY DAY, Disp: 90 tablet, Rfl: 3   Cholecalciferol (VITAMIN D) 50 MCG (2000 UT) CAPS, Take 2,000 Units by mouth daily. , Disp: , Rfl:    furosemide (LASIX) 20 MG tablet, TAKE 1 TABLET BY MOUTH DAILY, Disp: 30 tablet, Rfl: 3   levothyroxine (SYNTHROID) 88 MCG tablet, TAKE 1 TABLET EVERY DAY ON EMPTY STOMACHWITH A GLASS OF WATER AT LEAST 30-60 MINBEFORE BREAKFAST, Disp: 90 tablet, Rfl: 3   lisinopril (ZESTRIL) 2.5 MG tablet, TAKE ONE TABLET EVERY DAY, Disp: 30 tablet, Rfl: 6   Multiple Vitamin (MULTIVITAMIN) tablet, Take 1 tablet by mouth daily., Disp: , Rfl:    nadolol (CORGARD) 20 MG tablet, TAKE 1 TABLET BY MOUTH DAILY, Disp: 90 tablet, Rfl: 3   warfarin (COUMADIN) 3 MG tablet, TAKE ONE TABLET EVERY DAY, Disp: 30 tablet, Rfl: 6   warfarin (COUMADIN) 5 MG tablet, Take 5 mg by mouth daily., Disp: ,  Rfl:    No Known Allergies  ROS Review of Systems  Constitutional: Negative.   HENT: Negative.    Eyes: Negative.   Respiratory: Negative.    Cardiovascular: Negative.   Gastrointestinal: Negative.   Endocrine: Negative.   Genitourinary: Negative.   Musculoskeletal: Negative.   Skin: Negative.   Allergic/Immunologic: Negative.   Neurological: Negative.   Hematological: Negative.   Psychiatric/Behavioral: Negative.    All other systems reviewed and are negative.    Objective:    Physical Exam Vitals reviewed.  Constitutional:      Appearance: Normal appearance.  HENT:     Mouth/Throat:     Mouth: Mucous membranes are moist.  Eyes:     Pupils: Pupils are equal, round, and reactive to light.  Neck:     Vascular: No carotid bruit.   Cardiovascular:     Rate and Rhythm: Normal rate. Rhythm irregular.     Pulses: Normal pulses.     Heart sounds: Normal heart sounds.  Pulmonary:     Effort: Pulmonary effort is normal.     Breath sounds: Normal breath sounds.  Abdominal:     General: Bowel sounds are normal.     Palpations: Abdomen is soft. There is no hepatomegaly, splenomegaly or mass.     Tenderness: There is no abdominal tenderness.     Hernia: No hernia is present.  Musculoskeletal:     Cervical back: Neck supple.     Right lower leg: No edema.     Left lower leg: No edema.  Skin:    Findings: No rash.  Neurological:     Mental Status: He is alert and oriented to person, place, and time.     Motor: No weakness.  Psychiatric:        Mood and Affect: Mood normal.        Behavior: Behavior normal.    BP 130/86   Pulse 71   Ht 6\' 4"  (1.93 m)   Wt 176 lb 11.2 oz (80.2 kg)   BMI 21.51 kg/m  Wt Readings from Last 3 Encounters:  10/29/20 176 lb 11.2 oz (80.2 kg)  09/24/20 176 lb 9.6 oz (80.1 kg)  08/20/20 171 lb 6.4 oz (77.7 kg)     Health Maintenance Due  Topic Date Due   TETANUS/TDAP  Never done   Zoster Vaccines- Shingrix (1 of 2) Never done   COVID-19 Vaccine (4 - Booster for Pfizer series) 03/23/2020    There are no preventive care reminders to display for this patient.  No results found for: TSH Lab Results  Component Value Date   WBC 6.8 11/16/2019   HGB 15.3 11/16/2019   HCT 46.9 11/16/2019   MCV 91.1 11/16/2019   PLT 168 11/16/2019   Lab Results  Component Value Date   NA 139 11/16/2019   K 4.2 11/16/2019   CO2 26 11/16/2019   GLUCOSE 72 11/16/2019   BUN 19 11/16/2019   CREATININE 1.56 (H) 11/16/2019   BILITOT 1.8 (H) 11/16/2019   ALKPHOS 52 10/08/2014   AST 40 (H) 11/16/2019   ALT 35 11/16/2019   PROT 6.2 11/16/2019   ALBUMIN 3.5 10/08/2014   CALCIUM 9.2 11/16/2019   ANIONGAP 6 12/23/2017   Lab Results  Component Value Date   CHOL 159 10/12/2012   Lab Results   Component Value Date   HDL 59 10/12/2012   Lab Results  Component Value Date   LDLCALC 83 10/12/2012   Lab Results  Component Value Date  TRIG 87 10/12/2012   Lab Results  Component Value Date   CHOLHDL 2.7 10/12/2012   No results found for: HGBA1C    Assessment & Plan:   Problem List Items Addressed This Visit       Cardiovascular and Mediastinum   Atrial fibrillation (HCC) - Primary    Atrial fibrillation is stable at the present time pro time is therapeutic.  There is no evidence of any bleeding or black stool.      Relevant Orders   POCT INR (Completed)   Sick sinus syndrome (HCC)    No episode of syncope or tacky arrhythmia.      Essential hypertension    Blood pressure is under control on the present medication        Endocrine   Hypothyroidism    Stable at the present time.        Other   Hyperlipidemia    Hypercholesterolemia  I advised the patient to follow Mediterranean diet This diet is rich in fruits vegetables and whole grain, and This diet is also rich in fish and lean meat Patient should also eat a handful of almonds or walnuts daily Recent heart study indicated that average follow-up on this kind of diet reduces the cardiovascular mortality by 50 to 70%==      Presence of permanent cardiac pacemaker    Patient get his pacemaker checked every 3 months.       No orders of the defined types were placed in this encounter.  Protime is low today  , will do labs Follow-up: No follow-ups on file.    Corky Downs, MD

## 2020-11-09 ENCOUNTER — Other Ambulatory Visit: Payer: Self-pay | Admitting: Internal Medicine

## 2020-11-09 DIAGNOSIS — I4891 Unspecified atrial fibrillation: Secondary | ICD-10-CM

## 2020-12-03 ENCOUNTER — Ambulatory Visit (INDEPENDENT_AMBULATORY_CARE_PROVIDER_SITE_OTHER): Payer: Medicare PPO | Admitting: Internal Medicine

## 2020-12-03 ENCOUNTER — Other Ambulatory Visit: Payer: Self-pay

## 2020-12-03 VITALS — BP 131/87 | HR 89 | Ht 76.0 in | Wt 182.0 lb

## 2020-12-03 DIAGNOSIS — I495 Sick sinus syndrome: Secondary | ICD-10-CM | POA: Diagnosis not present

## 2020-12-03 DIAGNOSIS — I10 Essential (primary) hypertension: Secondary | ICD-10-CM | POA: Diagnosis not present

## 2020-12-03 DIAGNOSIS — N138 Other obstructive and reflux uropathy: Secondary | ICD-10-CM

## 2020-12-03 DIAGNOSIS — Z950 Presence of cardiac pacemaker: Secondary | ICD-10-CM

## 2020-12-03 DIAGNOSIS — N401 Enlarged prostate with lower urinary tract symptoms: Secondary | ICD-10-CM | POA: Diagnosis not present

## 2020-12-04 ENCOUNTER — Encounter: Payer: Self-pay | Admitting: Internal Medicine

## 2020-12-04 ENCOUNTER — Ambulatory Visit: Payer: Medicare PPO | Admitting: Internal Medicine

## 2020-12-04 VITALS — BP 122/82 | HR 91 | Ht 76.0 in | Wt 182.0 lb

## 2020-12-04 DIAGNOSIS — I4891 Unspecified atrial fibrillation: Secondary | ICD-10-CM | POA: Diagnosis not present

## 2020-12-04 DIAGNOSIS — E063 Autoimmune thyroiditis: Secondary | ICD-10-CM | POA: Diagnosis not present

## 2020-12-04 DIAGNOSIS — I10 Essential (primary) hypertension: Secondary | ICD-10-CM

## 2020-12-04 DIAGNOSIS — Z23 Encounter for immunization: Secondary | ICD-10-CM | POA: Diagnosis not present

## 2020-12-04 DIAGNOSIS — E038 Other specified hypothyroidism: Secondary | ICD-10-CM

## 2020-12-04 DIAGNOSIS — N138 Other obstructive and reflux uropathy: Secondary | ICD-10-CM | POA: Diagnosis not present

## 2020-12-04 DIAGNOSIS — N401 Enlarged prostate with lower urinary tract symptoms: Secondary | ICD-10-CM | POA: Diagnosis not present

## 2020-12-04 LAB — POCT INR: INR: 1.7 — AB (ref 2.0–3.0)

## 2020-12-04 MED ORDER — WARFARIN SODIUM 5 MG PO TABS: 5.0000 mg | ORAL_TABLET | Freq: Every day | ORAL | 3 refills | Status: DC

## 2020-12-04 NOTE — Assessment & Plan Note (Signed)
Findings reviewed Coumadin to 5 mg p.o. daily

## 2020-12-04 NOTE — Progress Notes (Signed)
Established Patient Office Visit  Subjective:  Patient ID: Johnny Norman, male    DOB: 1925/12/16  Age: 87 y.o. MRN: [id number]  CC:  Chief Complaint  Patient presents with   Atrial Fibrillation    Atrial Fibrillation Past medical history includes atrial fibrillation.   Johnny Norman presents for check up  Past Medical History:  Diagnosis Date   A-fib [facility])    CHF (congestive heart failure) (HCC)    Claudication (HCC)    leg   Hyperlipidemia    Peripheral vascular disease (HCC)    Presence of permanent cardiac pacemaker    Small bowel obstruction (HCC)    Thyroid disease     Past Surgical History:  Procedure Laterality Date   APPENDECTOMY     BOWEL RESECTION  10/12/2014   Procedure: SMALL BOWEL RESECTION;  Surgeon: Gladis Riffle, MD;  Location: [facility] ORS;  Service: General;;   CHOLECYSTECTOMY     COLONOSCOPY     INSERT / REPLACE / REMOVE PACEMAKER  09/13/2008   SSS s/p pacemaker implant; Medtronic pacemaker,.   LAPAROTOMY N/A 10/12/2014   Procedure: EXPLORATORY LAPAROTOMY;  Surgeon: Gladis Riffle, MD;  Location: [facility] OR[facility];  Service: General;  Laterality: N/A;   PACEMAKER INSERTION N/A 12/30/2017   Procedure: GENERATOR CHANGE OUT- DUAL CHAMBER;  Surgeon: Corky Downs, MD;  Location: [facility] ORS;  Service: Cardiovascular;  Laterality: N/A;   PROSTATE SURGERY      Family History  Family history unknown: Yes    Social History   Socioeconomic History   Marital status: Unknown    Spouse name: Not on file   Number of children: Not on file   Years of education: Not on file   Highest education level: Not on file  Occupational History   Not on file  Tobacco Use   Smoking status: Never   Smokeless tobacco: Never  Vaping Use   Vaping Use: Never used  Substance and Sexual Activity   Alcohol use: No    Comment: occasional   Drug use: No   Sexual activity: Yes    Birth control/protection: None  Other Topics Concern   Not on file  Social  History Narrative   Not on file   Social Determinants of Health   Financial Resource Strain: Not on file  Food Insecurity: Not on file  Transportation Needs: Not on file  Physical Activity: Not on file  Stress: Not on file  Social Connections: Not on file  Intimate Partner Violence: Not on file     Current Outpatient Medications:    atorvastatin (LIPITOR) 10 MG tablet, TAKE ONE TABLET BY MOUTH EVERY DAY, Disp: 90 tablet, Rfl: 3   Cholecalciferol (VITAMIN D) 50 MCG (2000 UT) CAPS, Take 2,000 Units by mouth daily. , Disp: , Rfl:    furosemide (LASIX) 20 MG tablet, TAKE ONE TABLET (20 MG) BY MOUTH EVERY DAY, Disp: 30 tablet, Rfl: 3   levothyroxine (SYNTHROID) 88 MCG tablet, TAKE 1 TABLET EVERY DAY ON EMPTY STOMACHWITH A GLASS OF WATER AT LEAST 30-60 MINBEFORE BREAKFAST, Disp: 90 tablet, Rfl: 3   lisinopril (ZESTRIL) 2.5 MG tablet, TAKE 1 TABLET BY MOUTH DAILY, Disp: 30 tablet, Rfl: 6   Multiple Vitamin (MULTIVITAMIN) tablet, Take 1 tablet by mouth daily., Disp: , Rfl:    nadolol (CORGARD) 20 MG tablet, TAKE 1 TABLET BY MOUTH DAILY, Disp: 90 tablet, Rfl: 3   warfarin (COUMADIN) 5 MG tablet, Take 1 tablet (5 mg total) by mouth daily., Disp: 30  tablet, Rfl: 3   No Known Allergies  ROS Review of Systems  Constitutional: Negative.   HENT: Negative.    Eyes: Negative.   Respiratory: Negative.    Cardiovascular: Negative.   Gastrointestinal: Negative.   Endocrine: Negative.   Genitourinary: Negative.   Musculoskeletal: Negative.   Skin: Negative.   Allergic/Immunologic: Negative.   Neurological: Negative.   Hematological: Negative.   Psychiatric/Behavioral: Negative.    All other systems reviewed and are negative.    Objective:    Physical Exam Vitals reviewed.  Constitutional:      Appearance: Normal appearance.  HENT:     Mouth/Throat:     Mouth: Mucous membranes are moist.  Eyes:     Pupils: Pupils are equal, round, and reactive to light.  Neck:     Vascular: No  carotid bruit.  Cardiovascular:     Rate and Rhythm: Normal rate and regular rhythm.     Pulses: Normal pulses.     Heart sounds: Normal heart sounds.  Pulmonary:     Effort: Pulmonary effort is normal.     Breath sounds: Normal breath sounds.  Abdominal:     General: Bowel sounds are normal.     Palpations: Abdomen is soft. There is no hepatomegaly, splenomegaly or mass.     Tenderness: There is no abdominal tenderness.     Hernia: No hernia is present.  Musculoskeletal:     Cervical back: Neck supple.     Right lower leg: No edema.     Left lower leg: No edema.  Skin:    Findings: No rash.  Neurological:     Mental Status: He is alert and oriented to person, place, and time.     Motor: No weakness.  Psychiatric:        Mood and Affect: Mood normal.        Behavior: Behavior normal.    BP 122/82   Pulse 91   Ht 6\' 4"  (1.93 m)   Wt 182 lb (82.6 kg)   BMI 22.15 kg/m  Wt Readings from Last 3 Encounters:  12/04/20 182 lb (82.6 kg)  12/03/20 182 lb (82.6 kg)  10/29/20 176 lb 11.2 oz (80.2 kg)     Health Maintenance Due  Topic Date Due   Zoster Vaccines- Shingrix (1 of 2) Never done   Pneumonia Vaccine 10+ Years old (1 - PCV) Never done   COVID-19 Vaccine (4 - Booster for Pfizer series) 01/16/2020    There are no preventive care reminders to display for this patient.  No results found for: TSH Lab Results  Component Value Date   WBC 6.8 11/16/2019   HGB 15.3 11/16/2019   HCT 46.9 11/16/2019   MCV 91.1 11/16/2019   PLT 168 11/16/2019   Lab Results  Component Value Date   NA 139 11/16/2019   K 4.2 11/16/2019   CO2 26 11/16/2019   GLUCOSE 72 11/16/2019   BUN 19 11/16/2019   CREATININE 1.56 (H) 11/16/2019   BILITOT 1.8 (H) 11/16/2019   ALKPHOS 52 10/08/2014   AST 40 (H) 11/16/2019   ALT 35 11/16/2019   PROT 6.2 11/16/2019   ALBUMIN 3.5 10/08/2014   CALCIUM 9.2 11/16/2019   ANIONGAP 6 12/23/2017   Lab Results  Component Value Date   CHOL 159  10/12/2012   Lab Results  Component Value Date   HDL 59 10/12/2012   Lab Results  Component Value Date   LDLCALC 83 10/12/2012   Lab Results  Component  Value Date   TRIG 87 10/12/2012   Lab Results  Component Value Date   CHOLHDL 2.7 10/12/2012   No results found for: HGBA1C    Assessment & Plan:   Problem List Items Addressed This Visit       Cardiovascular and Mediastinum   Atrial fibrillation (HCC) - Primary    Findings reviewed Coumadin to 5 mg p.o. daily      Relevant Medications   warfarin (COUMADIN) 5 MG tablet   Other Relevant Orders   POCT INR (Completed)   Essential hypertension    Blood pressure stable at the present time      Relevant Medications   warfarin (COUMADIN) 5 MG tablet     Endocrine   Hypothyroidism    The present time        Genitourinary   Benign prostatic hyperplasia with urinary obstruction    Nocturia is under control      Other Visit Diagnoses     Need for Tdap vaccination       Relevant Orders   Tdap vaccine greater than or equal to 7yo IM (Completed)       Meds ordered this encounter  Medications   warfarin (COUMADIN) 5 MG tablet    Sig: Take 1 tablet (5 mg total) by mouth daily.    Dispense:  30 tablet    Refill:  3    Follow-up: No follow-ups on file.    Corky Downs, MD

## 2020-12-04 NOTE — Assessment & Plan Note (Signed)
The present time

## 2020-12-04 NOTE — Assessment & Plan Note (Signed)
Blood pressure stable at the present time 

## 2020-12-04 NOTE — Assessment & Plan Note (Signed)
Nocturia is under control

## 2020-12-05 ENCOUNTER — Ambulatory Visit (INDEPENDENT_AMBULATORY_CARE_PROVIDER_SITE_OTHER): Payer: Medicare PPO | Admitting: *Deleted

## 2020-12-05 ENCOUNTER — Other Ambulatory Visit: Payer: Self-pay

## 2020-12-05 DIAGNOSIS — E782 Mixed hyperlipidemia: Secondary | ICD-10-CM

## 2020-12-05 DIAGNOSIS — E038 Other specified hypothyroidism: Secondary | ICD-10-CM

## 2020-12-05 DIAGNOSIS — N401 Enlarged prostate with lower urinary tract symptoms: Secondary | ICD-10-CM

## 2020-12-05 DIAGNOSIS — N138 Other obstructive and reflux uropathy: Secondary | ICD-10-CM

## 2020-12-05 DIAGNOSIS — I10 Essential (primary) hypertension: Secondary | ICD-10-CM

## 2020-12-05 DIAGNOSIS — E063 Autoimmune thyroiditis: Secondary | ICD-10-CM

## 2020-12-06 LAB — COMPLETE METABOLIC PANEL WITH GFR
AG Ratio: 1.7 (calc) (ref 1.0–2.5)
ALT: 31 U/L (ref 9–46)
AST: 34 U/L (ref 10–35)
Albumin: 3.9 g/dL (ref 3.6–5.1)
Alkaline phosphatase (APISO): 73 U/L (ref 35–144)
BUN/Creatinine Ratio: 18 (calc) (ref 6–22)
BUN: 27 mg/dL — ABNORMAL HIGH (ref 7–25)
CO2: 19 mmol/L — ABNORMAL LOW (ref 20–32)
Calcium: 9.1 mg/dL (ref 8.6–10.3)
Chloride: 107 mmol/L (ref 98–110)
Creat: 1.49 mg/dL — ABNORMAL HIGH (ref 0.70–1.22)
Globulin: 2.3 g/dL (calc) (ref 1.9–3.7)
Glucose, Bld: 100 mg/dL — ABNORMAL HIGH (ref 65–99)
Potassium: 4.2 mmol/L (ref 3.5–5.3)
Sodium: 143 mmol/L (ref 135–146)
Total Bilirubin: 1.8 mg/dL — ABNORMAL HIGH (ref 0.2–1.2)
Total Protein: 6.2 g/dL (ref 6.1–8.1)
eGFR: 43 mL/min/{1.73_m2} — ABNORMAL LOW (ref >=60)

## 2020-12-06 LAB — LIPID PANEL
Cholesterol: 131 mg/dL (ref <200)
HDL: 41 mg/dL (ref >=40)
LDL Cholesterol (Calc): 71 mg/dL (calc)
Non-HDL Cholesterol (Calc): 90 mg/dL (calc) (ref <130)
Total CHOL/HDL Ratio: 3.2 (calc) (ref <5.0)
Triglycerides: 103 mg/dL (ref <150)

## 2020-12-06 LAB — TSH: TSH: 0.72 mIU/L (ref 0.40–4.50)

## 2020-12-06 LAB — CBC WITH DIFFERENTIAL/PLATELET
Absolute Monocytes: 522 cells/uL (ref 200–950)
Basophils Absolute: 18 cells/uL (ref 0–200)
Basophils Relative: 0.3 %
Eosinophils Absolute: 108 cells/uL (ref 15–500)
Eosinophils Relative: 1.8 %
HCT: 47 % (ref 38.5–50.0)
Hemoglobin: 15.5 g/dL (ref 13.2–17.1)
Lymphs Abs: 1026 cells/uL (ref 850–3900)
MCH: 29.9 pg (ref 27.0–33.0)
MCHC: 33 g/dL (ref 32.0–36.0)
MCV: 90.6 fL (ref 80.0–100.0)
MPV: 10.5 fL (ref 7.5–12.5)
Monocytes Relative: 8.7 %
Neutro Abs: 4326 cells/uL (ref 1500–7800)
Neutrophils Relative %: 72.1 %
Platelets: 138 10*3/uL — ABNORMAL LOW (ref 140–400)
RBC: 5.19 10*6/uL (ref 4.20–5.80)
RDW: 13.4 % (ref 11.0–15.0)
Total Lymphocyte: 17.1 %
WBC: 6 10*3/uL (ref 3.8–10.8)

## 2020-12-06 LAB — PSA: PSA: 1.02 ng/mL (ref <=4.00)

## 2020-12-09 ENCOUNTER — Encounter: Payer: Self-pay | Admitting: Internal Medicine

## 2020-12-09 NOTE — Assessment & Plan Note (Signed)
Stable at the present time. 

## 2020-12-09 NOTE — Progress Notes (Addendum)
Established Patient Office Visit  Subjective:  Patient ID: Johnny Norman, male    DOB: 11-Apr-1925  Age: 87 y.o. MRN: [id number]  CC:  Chief Complaint  Patient presents with   Pacemaker Check         HPI  Johnny Norman presents for pacer check and office visit he denies any chest pain arrhythmia or syncope.  There is no history of tachycardia  Past Medical History:  Diagnosis Date   A-fib ([place])    CHF (congestive heart failure) (HCC)    Claudication (HCC)    leg   Hyperlipidemia    Peripheral vascular disease (HCC)    Presence of permanent cardiac pacemaker    Small bowel obstruction (HCC)    Thyroid disease     Past Surgical History:  Procedure Laterality Date   APPENDECTOMY     BOWEL RESECTION  10/12/2014   Procedure: SMALL BOWEL RESECTION;  Surgeon: Hubbard Robinson, MD;  Location: [facility] ORS;  Service: General;;   CHOLECYSTECTOMY     COLONOSCOPY     INSERT / REPLACE / REMOVE PACEMAKER  09/13/2008   SSS s/p pacemaker implant; Medtronic pacemaker,.   LAPAROTOMY N/A 10/12/2014   Procedure: EXPLORATORY LAPAROTOMY;  Surgeon: Hubbard Robinson, MD;  Location: [facility] OR[facility];  Service: General;  Laterality: N/A;   PACEMAKER INSERTION N/A 12/30/2017   Procedure: GENERATOR CHANGE OUT- DUAL CHAMBER;  Surgeon: Cletis Athens, MD;  Location: [facility] ORS;  Service: Cardiovascular;  Laterality: N/A;   PROSTATE SURGERY      Family History  Family history unknown: Yes    Social History   Socioeconomic History   Marital status: Unknown    Spouse name: Not on file   Number of children: Not on file   Years of education: Not on file   Highest education level: Not on file  Occupational History   Not on file  Tobacco Use   Smoking status: Never   Smokeless tobacco: Never  Vaping Use   Vaping Use: Never used  Substance and Sexual Activity   Alcohol use: No    Comment: occasional   Drug use: No   Sexual activity: Yes    Birth control/protection: None  Other  Topics Concern   Not on file  Social History Narrative   Not on file   Social Determinants of Health   Financial Resource Strain: Not on file  Food Insecurity: Not on file  Transportation Needs: Not on file  Physical Activity: Not on file  Stress: Not on file  Social Connections: Not on file  Intimate Partner Violence: Not on file     Current Outpatient Medications:    atorvastatin (LIPITOR) 10 MG tablet, TAKE ONE TABLET BY MOUTH EVERY DAY, Disp: 90 tablet, Rfl: 3   Cholecalciferol (VITAMIN D) 50 MCG (2000 UT) CAPS, Take 2,000 Units by mouth daily. , Disp: , Rfl:    furosemide (LASIX) 20 MG tablet, TAKE ONE TABLET (20 MG) BY MOUTH EVERY DAY, Disp: 30 tablet, Rfl: 3   levothyroxine (SYNTHROID) 88 MCG tablet, TAKE 1 TABLET EVERY DAY ON EMPTY STOMACHWITH A GLASS OF WATER AT LEAST 30-60 MINBEFORE BREAKFAST, Disp: 90 tablet, Rfl: 3   lisinopril (ZESTRIL) 2.5 MG tablet, TAKE 1 TABLET BY MOUTH DAILY, Disp: 30 tablet, Rfl: 6   Multiple Vitamin (MULTIVITAMIN) tablet, Take 1 tablet by mouth daily., Disp: , Rfl:    nadolol (CORGARD) 20 MG tablet, TAKE 1 TABLET BY MOUTH DAILY, Disp: 90 tablet, Rfl: 3   warfarin (  COUMADIN) 5 MG tablet, Take 1 tablet (5 mg total) by mouth daily., Disp: 30 tablet, Rfl: 3   No Known Allergies  ROS Review of Systems  Constitutional: Negative.   HENT: Negative.    Eyes: Negative.   Respiratory: Negative.    Cardiovascular: Negative.   Gastrointestinal: Negative.   Endocrine: Negative.   Genitourinary: Negative.   Musculoskeletal: Negative.   Skin: Negative.   Allergic/Immunologic: Negative.   Neurological: Negative.   Hematological: Negative.   Psychiatric/Behavioral: Negative.    All other systems reviewed and are negative.    Objective:    Physical Exam Vitals reviewed.  Constitutional:      Appearance: Normal appearance.  HENT:     Mouth/Throat:     Mouth: Mucous membranes are moist.  Eyes:     Pupils: Pupils are equal, round, and reactive  to light.  Neck:     Vascular: No carotid bruit.  Cardiovascular:     Rate and Rhythm: Normal rate and regular rhythm.     Pulses: Normal pulses.     Heart sounds: Normal heart sounds.  Pulmonary:     Effort: Pulmonary effort is normal.     Breath sounds: Normal breath sounds.  Abdominal:     General: Bowel sounds are normal.     Palpations: Abdomen is soft. There is no hepatomegaly, splenomegaly or mass.     Tenderness: There is no abdominal tenderness.     Hernia: No hernia is present.  Musculoskeletal:     Cervical back: Neck supple.     Right lower leg: No edema.     Left lower leg: No edema.  Skin:    Findings: No rash.  Neurological:     Mental Status: He is alert and oriented to person, place, and time.     Motor: No weakness.  Psychiatric:        Mood and Affect: Mood normal.        Behavior: Behavior normal.    BP 131/87   Pulse 89   Ht 6' 4"  (1.93 m)   Wt 182 lb (82.6 kg)   BMI 22.15 kg/m  Wt Readings from Last 3 Encounters:  12/04/20 182 lb (82.6 kg)  12/03/20 182 lb (82.6 kg)  10/29/20 176 lb 11.2 oz (80.2 kg)     Health Maintenance Due  Topic Date Due   Zoster Vaccines- Shingrix (1 of 2) Never done   Pneumonia Vaccine 71+ Years old (1 - PCV) Never done   COVID-19 Vaccine (4 - Booster for Pfizer series) 01/16/2020    There are no preventive care reminders to display for this patient.  Lab Results  Component Value Date   TSH 0.72 12/05/2020   Lab Results  Component Value Date   WBC 6.0 12/05/2020   HGB 15.5 12/05/2020   HCT 47.0 12/05/2020   MCV 90.6 12/05/2020   PLT 138 (L) 12/05/2020   Lab Results  Component Value Date   NA 143 12/05/2020   K 4.2 12/05/2020   CO2 19 (L) 12/05/2020   GLUCOSE 100 (H) 12/05/2020   BUN 27 (H) 12/05/2020   CREATININE 1.49 (H) 12/05/2020   BILITOT 1.8 (H) 12/05/2020   ALKPHOS 52 10/08/2014   AST 34 12/05/2020   ALT 31 12/05/2020   PROT 6.2 12/05/2020   ALBUMIN 3.5 10/08/2014   CALCIUM 9.1 12/05/2020    ANIONGAP 6 12/23/2017   EGFR 43 (L) 12/05/2020   Lab Results  Component Value Date   CHOL 131 12/05/2020  Lab Results  Component Value Date   HDL 41 12/05/2020   Lab Results  Component Value Date   LDLCALC 71 12/05/2020   Lab Results  Component Value Date   TRIG 103 12/05/2020   Lab Results  Component Value Date   CHOLHDL 3.2 12/05/2020   No results found for: HGBA1C    Assessment & Plan:   Problem List Items Addressed This Visit       Cardiovascular and Mediastinum   Sick sinus syndrome ([place])    Pacemaker is functioning well no arrhythmia noted on interrogation      Essential hypertension    Blood pressure is under control chest is clear        Genitourinary   Benign prostatic hyperplasia with urinary obstruction    Stable at the present time        Other   Presence of permanent cardiac pacemaker    Patient is functioning well      Other Visit Diagnoses     Cardiac pacemaker in situ    -  Primary   Relevant Orders   PACEMAKER IN CLINIC CHECK     Note: Medical Device Follow-up  Patient pacemaker was interrogated by pacemakers analyzer, battery status is okay.  No programming changes were indicated after the review of the data.  Histogram shows no change since the last interrogation Atrial and ventricular sensing thresholds were found to be acceptable Impedance was checked and it was found to be normal.  Thresholds were found to be okay on evaluation of rhythm problem.  No high rate or low rate arrhythmia were noted.  Estimated battery longevity is 10 years. I have personally reviewed the device data and amended the report as necessary.    No orders of the defined types were placed in this encounter.   Follow-up: No follow-ups on file.    Edit       Signed  Encounter Date: 10/29/2020   Expand All Collapse All    Established Patient Office Visit   Subjective:  Patient ID: Johnny Norman, male    DOB: [birth year]  Age: 87 y.o.  MRN: [id number]   CC:     Chief Complaint  Patient presents with   Annual Exam      HPI   Johnny Norman presents for physical       Past Medical History:  Diagnosis Date   A-fib [facility])     CHF (congestive heart failure) ([place])     Claudication ([place])      leg   Hyperlipidemia     Peripheral vascular disease ([place])     Presence of permanent cardiac pacemaker     Small bowel obstruction ([place])     Thyroid disease             Past Surgical History:  Procedure Laterality Date   APPENDECTOMY       BOWEL RESECTION   10/12/2014    Procedure: SMALL BOWEL RESECTION;  Surgeon: Hubbard Robinson, MD;  Location: [facility] ORS;  Service: General;;   CHOLECYSTECTOMY       COLONOSCOPY       INSERT / REPLACE / REMOVE PACEMAKER   09/13/2008    SSS s/p pacemaker implant; Medtronic pacemaker,.   LAPAROTOMY N/A 10/12/2014    Procedure: EXPLORATORY LAPAROTOMY;  Surgeon: Hubbard Robinson, MD;  Location: [facility] OR[facility];  Service: General;  Laterality: N/A;   PACEMAKER INSERTION N/A 12/30/2017    Procedure: GENERATOR CHANGE OUT- DUAL CHAMBER;  Surgeon: Cletis Athens, MD;  Location: [facility] OR[facility];  Service: Cardiovascular;  Laterality: N/A;   PROSTATE SURGERY          Family History  Family history unknown: Yes      Social History  Social History         Socioeconomic History   Marital status: Unknown      Spouse name: Not on file   Number of children: Not on file   Years of education: Not on file   Highest education level: Not on file  Occupational History   Not on file  Tobacco Use   Smoking status: Never   Smokeless tobacco: Never  Vaping Use   Vaping Use: Never used  Substance and Sexual Activity   Alcohol use: No      Comment: occasional   Drug use: No   Sexual activity: Yes      Birth control/protection: None  Other Topics Concern   Not on file  Social History Narrative   Not on file    Social Determinants of Health    Financial Resource Strain: Not on file  Food  Insecurity: Not on file  Transportation Needs: Not on file  Physical Activity: Not on file  Stress: Not on file  Social Connections: Not on file  Intimate Partner Violence: Not on file          Current Outpatient Medications:    atorvastatin (LIPITOR) 10 MG tablet, TAKE ONE TABLET BY MOUTH EVERY DAY, Disp: 90 tablet, Rfl: 3   Cholecalciferol (VITAMIN D) 50 MCG (2000 UT) CAPS, Take 2,000 Units by mouth daily. , Disp: , Rfl:    furosemide (LASIX) 20 MG tablet, TAKE 1 TABLET BY MOUTH DAILY, Disp: 30 tablet, Rfl: 3   levothyroxine (SYNTHROID) 88 MCG tablet, TAKE 1 TABLET EVERY DAY ON EMPTY STOMACHWITH A GLASS OF WATER AT LEAST 30-60 MINBEFORE BREAKFAST, Disp: 90 tablet, Rfl: 3   lisinopril (ZESTRIL) 2.5 MG tablet, TAKE ONE TABLET EVERY DAY, Disp: 30 tablet, Rfl: 6   Multiple Vitamin (MULTIVITAMIN) tablet, Take 1 tablet by mouth daily., Disp: , Rfl:    nadolol (CORGARD) 20 MG tablet, TAKE 1 TABLET BY MOUTH DAILY, Disp: 90 tablet, Rfl: 3   warfarin (COUMADIN) 3 MG tablet, TAKE ONE TABLET EVERY DAY, Disp: 30 tablet, Rfl: 6   warfarin (COUMADIN) 5 MG tablet, Take 5 mg by mouth daily., Disp: , Rfl:     No Known Allergies   ROS Review of Systems  Constitutional: Negative.   HENT: Negative.    Eyes: Negative.   Respiratory: Negative.    Cardiovascular: Negative.   Gastrointestinal: Negative.   Endocrine: Negative.   Genitourinary: Negative.   Musculoskeletal: Negative.   Skin: Negative.   Allergic/Immunologic: Negative.   Neurological: Negative.   Hematological: Negative.   Psychiatric/Behavioral: Negative.    All other systems reviewed and are negative.   Objective:    Objective    Physical Exam Vitals reviewed.  Constitutional:      Appearance: Normal appearance.  HENT:     Mouth/Throat:     Mouth: Mucous membranes are moist.  Eyes:     Pupils: Pupils are equal, round, and reactive to light.  Neck:     Vascular: No carotid bruit.  Cardiovascular:     Rate and Rhythm:  Normal rate. Rhythm irregular.     Pulses: Normal pulses.     Heart sounds: Normal heart sounds.  Pulmonary:     Effort: Pulmonary effort is normal.  Breath sounds: Normal breath sounds.  Abdominal:     General: Bowel sounds are normal.     Palpations: Abdomen is soft. There is no hepatomegaly, splenomegaly or mass.     Tenderness: There is no abdominal tenderness.     Hernia: No hernia is present.  Musculoskeletal:     Cervical back: Neck supple.     Right lower leg: No edema.     Left lower leg: No edema.  Skin:    Findings: No rash.  Neurological:     Mental Status: He is alert and oriented to person, place, and time.     Motor: No weakness.  Psychiatric:        Mood and Affect: Mood normal.        Behavior: Behavior normal.      BP 130/86   Pulse 71   Ht 6' 4"  (1.93 m)   Wt 176 lb 11.2 oz (80.2 kg)   BMI 21.51 kg/m     Wt Readings from Last 3 Encounters:  10/29/20 176 lb 11.2 oz (80.2 kg)  09/24/20 176 lb 9.6 oz (80.1 kg)  08/20/20 171 lb 6.4 oz (77.7 kg)            Health Maintenance Due  Topic Date Due   TETANUS/TDAP  Never done   Zoster Vaccines- Shingrix (1 of 2) Never done   COVID-19 Vaccine (4 - Booster for Pfizer series) 03/23/2020      There are no preventive care reminders to display for this patient.   Recent Labs[] Expand by Default  No results found for: TSH   Recent Labs       Lab Results  Component Value Date    WBC 6.8 11/16/2019    HGB 15.3 11/16/2019    HCT 46.9 11/16/2019    MCV 91.1 11/16/2019    PLT 168 11/16/2019      Recent Labs       Lab Results  Component Value Date    NA 139 11/16/2019    K 4.2 11/16/2019    CO2 26 11/16/2019    GLUCOSE 72 11/16/2019    BUN 19 11/16/2019    CREATININE 1.56 (H) 11/16/2019    BILITOT 1.8 (H) 11/16/2019    ALKPHOS 52 10/08/2014    AST 40 (H) 11/16/2019    ALT 35 11/16/2019    PROT 6.2 11/16/2019    ALBUMIN 3.5 10/08/2014    CALCIUM 9.2 11/16/2019    ANIONGAP 6 12/23/2017       Recent Labs       Lab Results  Component Value Date    CHOL 159 10/12/2012      Recent Labs       Lab Results  Component Value Date    HDL 59 10/12/2012      Recent Labs       Lab Results  Component Value Date    LDLCALC 83 10/12/2012      Recent Labs       Lab Results  Component Value Date    TRIG 87 10/12/2012      Recent Labs       Lab Results  Component Value Date    CHOLHDL 2.7 10/12/2012      Recent Labs  No results found for: HGBA1C         Assessment & Plan:                      [employer]  Cletis Athens, MD   PCPs Type     Cletis Athens, MD General     No other patient care team members   No visit treatment team   Recipients of [employer] More       Office Visit - 12/13/2018    Office Visit - 12/10/2017   Office Visit - 10/05/2017   Office Visit - 10/07/2016   Office Visit - 10/08/2015      Recipients of Automatic ADT Notifications for Most Recent Admission Show All Admissions No notifications found. Cletis Athens, MD

## 2020-12-09 NOTE — Assessment & Plan Note (Signed)
Patient is functioning well

## 2020-12-09 NOTE — Assessment & Plan Note (Signed)
Pacemaker is functioning well no arrhythmia noted on interrogation

## 2020-12-09 NOTE — Assessment & Plan Note (Signed)
Blood pressure is under control chest is clear

## 2021-01-03 ENCOUNTER — Ambulatory Visit: Payer: Medicare PPO | Admitting: Internal Medicine

## 2021-01-03 ENCOUNTER — Other Ambulatory Visit: Payer: Self-pay

## 2021-01-03 ENCOUNTER — Encounter: Payer: Self-pay | Admitting: Internal Medicine

## 2021-01-03 VITALS — BP 126/83 | HR 78 | Ht 76.0 in | Wt 182.0 lb

## 2021-01-03 DIAGNOSIS — N401 Enlarged prostate with lower urinary tract symptoms: Secondary | ICD-10-CM

## 2021-01-03 DIAGNOSIS — E038 Other specified hypothyroidism: Secondary | ICD-10-CM | POA: Diagnosis not present

## 2021-01-03 DIAGNOSIS — I4891 Unspecified atrial fibrillation: Secondary | ICD-10-CM | POA: Diagnosis not present

## 2021-01-03 DIAGNOSIS — N138 Other obstructive and reflux uropathy: Secondary | ICD-10-CM | POA: Diagnosis not present

## 2021-01-03 DIAGNOSIS — R2681 Unsteadiness on feet: Secondary | ICD-10-CM

## 2021-01-03 DIAGNOSIS — E063 Autoimmune thyroiditis: Secondary | ICD-10-CM

## 2021-01-03 LAB — POCT INR: INR: 4.6 — AB (ref 2.0–3.0)

## 2021-01-06 ENCOUNTER — Encounter: Payer: Self-pay | Admitting: Internal Medicine

## 2021-01-06 NOTE — Assessment & Plan Note (Signed)
Stable at the present time patient was advised to take her thyroid medication in the morning before breakfast

## 2021-01-06 NOTE — Assessment & Plan Note (Signed)
Symptoms under control 

## 2021-01-06 NOTE — Assessment & Plan Note (Signed)
Gait is stable

## 2021-01-06 NOTE — Progress Notes (Signed)
Established Patient Office Visit  Subjective:  Patient ID: Johnny Norman, male    DOB: Mar 27, 1925  Age: 87 y.o. MRN: [id number]  CC:  Chief Complaint  Patient presents with   Atrial Fibrillation    Patient currently taking 5 mg daily. Patient is out of range today.     Atrial Fibrillation Past medical history includes atrial fibrillation.   Johnny Norman presents for protime check  Past Medical History:  Diagnosis Date   A-fib [facility])    CHF (congestive heart failure) ([place])    Claudication ([place])    leg   Hyperlipidemia    Peripheral vascular disease ([place])    Presence of permanent cardiac pacemaker    Small bowel obstruction (HCC)    Thyroid disease     Past Surgical History:  Procedure Laterality Date   APPENDECTOMY     BOWEL RESECTION  10/12/2014   Procedure: SMALL BOWEL RESECTION;  Surgeon: Hubbard Robinson, MD;  Location: [facility] ORS;  Service: General;;   CHOLECYSTECTOMY     COLONOSCOPY     INSERT / REPLACE / REMOVE PACEMAKER  09/13/2008   SSS s/p pacemaker implant; [employer]tronic pacemaker,.   LAPAROTOMY N/A 10/12/2014   Procedure: EXPLORATORY LAPAROTOMY;  Surgeon: Hubbard Robinson, MD;  Location: [facility] ORS;  Service: General;  Laterality: N/A;   PACEMAKER INSERTION N/A 12/30/2017   Procedure: GENERATOR CHANGE OUT- DUAL CHAMBER;  Surgeon: Cletis Athens, MD;  Location: [facility] ORS;  Service: Cardiovascular;  Laterality: N/A;   PROSTATE SURGERY      Family History  Family history unknown: Yes    Social History   Socioeconomic History   Marital status: Unknown    Spouse name: Not on file   Number of children: Not on file   Years of education: Not on file   Highest education level: Not on file  Occupational History   Not on file  Tobacco Use   Smoking status: Never   Smokeless tobacco: Never  Vaping Use   Vaping Use: Never used  Substance and Sexual Activity   Alcohol use: No    Comment: occasional   Drug use: No   Sexual activity: Yes     Birth control/protection: None  Other Topics Concern   Not on file  Social History Narrative   Not on file   Social Determinants of Health   Financial Resource Strain: Not on file  Food Insecurity: Not on file  Transportation Needs: Not on file  Physical Activity: Not on file  Stress: Not on file  Social Connections: Not on file  Intimate Partner Violence: Not on file     Current Outpatient Medications:    atorvastatin (LIPITOR) 10 MG tablet, TAKE ONE TABLET BY MOUTH EVERY DAY, Disp: 90 tablet, Rfl: 3   Cholecalciferol (VITAMIN D) 50 MCG (2000 UT) CAPS, Take 2,000 Units by mouth daily. , Disp: , Rfl:    furosemide (LASIX) 20 MG tablet, TAKE ONE TABLET (20 MG) BY MOUTH EVERY DAY, Disp: 30 tablet, Rfl: 3   levothyroxine (SYNTHROID) 88 MCG tablet, TAKE 1 TABLET EVERY DAY ON EMPTY STOMACHWITH A GLASS OF WATER AT LEAST 30-60 MINBEFORE BREAKFAST, Disp: 90 tablet, Rfl: 3   lisinopril (ZESTRIL) 2.5 MG tablet, TAKE 1 TABLET BY MOUTH DAILY, Disp: 30 tablet, Rfl: 6   Multiple Vitamin (MULTIVITAMIN) tablet, Take 1 tablet by mouth daily., Disp: , Rfl:    nadolol (CORGARD) 20 MG tablet, TAKE 1 TABLET BY MOUTH DAILY, Disp: 90 tablet, Rfl: 3  warfarin (COUMADIN) 5 MG tablet, Take 1 tablet (5 mg total) by mouth daily., Disp: 30 tablet, Rfl: 3   No Known Allergies  ROS Review of Systems  Constitutional: Negative.   HENT: Negative.    Eyes: Negative.   Respiratory: Negative.    Cardiovascular: Negative.   Gastrointestinal: Negative.   Endocrine: Negative.   Genitourinary: Negative.   Musculoskeletal: Negative.   Skin: Negative.   Allergic/Immunologic: Negative.   Neurological: Negative.   Hematological: Negative.   Psychiatric/Behavioral: Negative.    All other systems reviewed and are negative.    Objective:    Physical Exam Vitals reviewed.  Constitutional:      Appearance: Normal appearance.  HENT:     Mouth/Throat:     Mouth: Mucous membranes are moist.  Eyes:     Pupils:  Pupils are equal, round, and reactive to light.  Neck:     Vascular: No carotid bruit.  Cardiovascular:     Rate and Rhythm: Normal rate and regular rhythm.     Pulses: Normal pulses.     Heart sounds: Normal heart sounds.  Pulmonary:     Effort: Pulmonary effort is normal.     Breath sounds: Normal breath sounds.  Abdominal:     General: Bowel sounds are normal.     Palpations: Abdomen is soft. There is no hepatomegaly, splenomegaly or mass.     Tenderness: There is no abdominal tenderness.     Hernia: No hernia is present.  Musculoskeletal:     Cervical back: Neck supple.     Right lower leg: No edema.     Left lower leg: No edema.  Skin:    Findings: No rash.  Neurological:     Mental Status: He is alert and oriented to person, place, and time.     Motor: No weakness.  Psychiatric:        Mood and Affect: Mood normal.        Behavior: Behavior normal.    BP 126/83   Pulse 78   Ht 6' 4"  (1.93 m)   Wt 182 lb (82.6 kg)   BMI 22.15 kg/m  Wt Readings from Last 3 Encounters:  01/03/21 182 lb (82.6 kg)  12/04/20 182 lb (82.6 kg)  12/03/20 182 lb (82.6 kg)     Health Maintenance Due  Topic Date Due   Zoster Vaccines- Shingrix (1 of 2) Never done   Pneumonia Vaccine 44+ Years old (1 - PCV) Never done   COVID-19 Vaccine (4 - Booster for Pfizer series) 01/16/2020    There are no preventive care reminders to display for this patient.  Lab Results  Component Value Date   TSH 0.72 12/05/2020   Lab Results  Component Value Date   WBC 6.0 12/05/2020   HGB 15.5 12/05/2020   HCT 47.0 12/05/2020   MCV 90.6 12/05/2020   PLT 138 (L) 12/05/2020   Lab Results  Component Value Date   NA 143 12/05/2020   K 4.2 12/05/2020   CO2 19 (L) 12/05/2020   GLUCOSE 100 (H) 12/05/2020   BUN 27 (H) 12/05/2020   CREATININE 1.49 (H) 12/05/2020   BILITOT 1.8 (H) 12/05/2020   ALKPHOS 52 10/08/2014   AST 34 12/05/2020   ALT 31 12/05/2020   PROT 6.2 12/05/2020   ALBUMIN 3.5  10/08/2014   CALCIUM 9.1 12/05/2020   ANIONGAP 6 12/23/2017   EGFR 43 (L) 12/05/2020   Lab Results  Component Value Date   CHOL 131 12/05/2020  Lab Results  Component Value Date   HDL 41 12/05/2020   Lab Results  Component Value Date   LDLCALC 71 12/05/2020   Lab Results  Component Value Date   TRIG 103 12/05/2020   Lab Results  Component Value Date   CHOLHDL 3.2 12/05/2020   No results found for: HGBA1C    Assessment & Plan:   Problem List Items Addressed This Visit       Cardiovascular and Mediastinum   Atrial fibrillation ([employer]) - Primary    Atrial fibrillation is stable.  Patient denies any chest pain or shortness of breath.  Pro time is elevated so reduce the Coumadin to 2.5 mg on Saturday and Sunday, return in a month      Relevant Orders   POCT INR (Completed)     Endocrine   Hypothyroidism    Stable at the present time patient was advised to take her thyroid medication in the morning before breakfast        Genitourinary   Benign prostatic hyperplasia with urinary obstruction    Symptoms under control        Other   Unsteady gait    Gait is stable       No orders of the defined types were placed in this encounter.   Follow-up: No follow-ups on file.    Cletis Athens, MD

## 2021-01-06 NOTE — Assessment & Plan Note (Signed)
Atrial fibrillation is stable.  Patient denies any chest pain or shortness of breath.  Pro time is elevated so reduce the Coumadin to 2.5 mg on Saturday and Sunday, return in a month

## 2021-02-20 ENCOUNTER — Other Ambulatory Visit: Payer: Self-pay

## 2021-02-20 ENCOUNTER — Encounter: Payer: Self-pay | Admitting: Internal Medicine

## 2021-02-20 ENCOUNTER — Ambulatory Visit: Payer: Medicare PPO | Admitting: Internal Medicine

## 2021-02-20 VITALS — BP 126/83 | HR 87 | Ht 76.0 in | Wt 177.3 lb

## 2021-02-20 DIAGNOSIS — N401 Enlarged prostate with lower urinary tract symptoms: Secondary | ICD-10-CM | POA: Diagnosis not present

## 2021-02-20 DIAGNOSIS — E063 Autoimmune thyroiditis: Secondary | ICD-10-CM

## 2021-02-20 DIAGNOSIS — R2681 Unsteadiness on feet: Secondary | ICD-10-CM

## 2021-02-20 DIAGNOSIS — N138 Other obstructive and reflux uropathy: Secondary | ICD-10-CM

## 2021-02-20 DIAGNOSIS — E782 Mixed hyperlipidemia: Secondary | ICD-10-CM | POA: Diagnosis not present

## 2021-02-20 DIAGNOSIS — I4891 Unspecified atrial fibrillation: Secondary | ICD-10-CM | POA: Diagnosis not present

## 2021-02-20 DIAGNOSIS — E038 Other specified hypothyroidism: Secondary | ICD-10-CM | POA: Diagnosis not present

## 2021-02-20 DIAGNOSIS — I495 Sick sinus syndrome: Secondary | ICD-10-CM | POA: Diagnosis not present

## 2021-02-20 LAB — POCT INR: INR: 5.5 — AB (ref 2.0–3.0)

## 2021-02-20 NOTE — Assessment & Plan Note (Signed)
Hypercholesterolemia  I advised the patient to follow Mediterranean diet This diet is rich in fruits vegetables and whole grain, and This diet is also rich in fish and lean meat Patient should also eat a handful of almonds or walnuts daily Recent heart study indicated that average follow-up on this kind of diet reduces the cardiovascular mortality by 50 to 70%== 

## 2021-02-20 NOTE — Progress Notes (Signed)
Established Patient Office Visit  Subjective:  Patient ID: Johnny Norman, male    DOB: 25-Jun-1925  Age: 88 y.o. MRN: [id number]  CC:  Chief Complaint  Patient presents with   Atrial Fibrillation    Patient is here for 1 month INR check    Atrial Fibrillation Past medical history includes atrial fibrillation.   Johnny Norman presents for check up Past Medical History:  Diagnosis Date   A-fib [facility])    CHF (congestive heart failure) ([place])    Claudication ([place])    leg   Hyperlipidemia    Peripheral vascular disease ([place])    Presence of permanent cardiac pacemaker    Small bowel obstruction (HCC)    Thyroid disease     Past Surgical History:  Procedure Laterality Date   APPENDECTOMY     BOWEL RESECTION  10/12/2014   Procedure: SMALL BOWEL RESECTION;  Surgeon: Hubbard Robinson, MD;  Location: [facility] ORS;  Service: General;;   CHOLECYSTECTOMY     COLONOSCOPY     INSERT / REPLACE / REMOVE PACEMAKER  09/13/2008   SSS s/p pacemaker implant; Medtronic pacemaker,.   LAPAROTOMY N/A 10/12/2014   Procedure: EXPLORATORY LAPAROTOMY;  Surgeon: Hubbard Robinson, MD;  Location: [facility] ORS;  Service: General;  Laterality: N/A;   PACEMAKER INSERTION N/A 12/30/2017   Procedure: GENERATOR CHANGE OUT- DUAL CHAMBER;  Surgeon: Cletis Athens, MD;  Location: [facility] ORS;  Service: Cardiovascular;  Laterality: N/A;   PROSTATE SURGERY      Family History  Family history unknown: Yes    Social History   Socioeconomic History   Marital status: Unknown    Spouse name: Not on file   Number of children: Not on file   Years of education: Not on file   Highest education level: Not on file  Occupational History   Not on file  Tobacco Use   Smoking status: Never   Smokeless tobacco: Never  Vaping Use   Vaping Use: Never used  Substance and Sexual Activity   Alcohol use: No    Comment: occasional   Drug use: No   Sexual activity: Yes    Birth control/protection: None  Other  Topics Concern   Not on file  Social History Narrative   Not on file   Social Determinants of Health   Financial Resource Strain: Not on file  Food Insecurity: Not on file  Transportation Needs: Not on file  Physical Activity: Not on file  Stress: Not on file  Social Connections: Not on file  Intimate Partner Violence: Not on file     Current Outpatient Medications:    atorvastatin (LIPITOR) 10 MG tablet, TAKE ONE TABLET BY MOUTH EVERY DAY, Disp: 90 tablet, Rfl: 3   Cholecalciferol (VITAMIN D) 50 MCG (2000 UT) CAPS, Take 2,000 Units by mouth daily. , Disp: , Rfl:    furosemide (LASIX) 20 MG tablet, TAKE ONE TABLET (20 MG) BY MOUTH EVERY DAY, Disp: 30 tablet, Rfl: 3   levothyroxine (SYNTHROID) 88 MCG tablet, TAKE 1 TABLET EVERY DAY ON EMPTY STOMACHWITH A GLASS OF WATER AT LEAST 30-60 MINBEFORE BREAKFAST, Disp: 90 tablet, Rfl: 3   lisinopril (ZESTRIL) 2.5 MG tablet, TAKE 1 TABLET BY MOUTH DAILY, Disp: 30 tablet, Rfl: 6   Multiple Vitamin (MULTIVITAMIN) tablet, Take 1 tablet by mouth daily., Disp: , Rfl:    nadolol (CORGARD) 20 MG tablet, TAKE 1 TABLET BY MOUTH DAILY, Disp: 90 tablet, Rfl: 3   warfarin (COUMADIN) 5 MG tablet,  Take 1 tablet (5 mg total) by mouth daily., Disp: 30 tablet, Rfl: 3   No Known Allergies  ROS Review of Systems  Constitutional: Negative.   HENT: Negative.    Eyes: Negative.   Respiratory: Negative.    Cardiovascular: Negative.   Gastrointestinal: Negative.   Endocrine: Negative.   Genitourinary: Negative.   Musculoskeletal: Negative.   Skin: Negative.   Allergic/Immunologic: Negative.   Neurological: Negative.   Hematological: Negative.   Psychiatric/Behavioral: Negative.    All other systems reviewed and are negative.    Objective:    Physical Exam Vitals reviewed.  Constitutional:      Appearance: Normal appearance.  HENT:     Mouth/Throat:     Mouth: Mucous membranes are moist.  Eyes:     Pupils: Pupils are equal, round, and reactive  to light.  Neck:     Vascular: No carotid bruit.  Cardiovascular:     Rate and Rhythm: Normal rate and regular rhythm.     Pulses: Normal pulses.     Heart sounds: Normal heart sounds.  Pulmonary:     Effort: Pulmonary effort is normal.     Breath sounds: Normal breath sounds.  Abdominal:     General: Bowel sounds are normal.     Palpations: Abdomen is soft. There is no hepatomegaly, splenomegaly or mass.     Tenderness: There is no abdominal tenderness.     Hernia: No hernia is present.  Musculoskeletal:     Cervical back: Neck supple.     Right lower leg: No edema.     Left lower leg: No edema.  Skin:    Findings: No rash.  Neurological:     Mental Status: He is alert and oriented to person, place, and time.     Motor: No weakness.  Psychiatric:        Mood and Affect: Mood normal.        Behavior: Behavior normal.    BP 126/83    Pulse 87    Ht 6' 4" (1.93 m)    Wt 177 lb 4.8 oz (80.4 kg)    BMI 21.58 kg/m  Wt Readings from Last 3 Encounters:  02/20/21 177 lb 4.8 oz (80.4 kg)  01/03/21 182 lb (82.6 kg)  12/04/20 182 lb (82.6 kg)     Health Maintenance Due  Topic Date Due   Zoster Vaccines- Shingrix (1 of 2) Never done   Pneumonia Vaccine 88+ Years old (1 - PCV) Never done   COVID-19 Vaccine (4 - Booster for Pfizer series) 01/16/2020    There are no preventive care reminders to display for this patient.  Lab Results  Component Value Date   TSH 0.72 12/05/2020   Lab Results  Component Value Date   WBC 6.0 12/05/2020   HGB 15.5 12/05/2020   HCT 47.0 12/05/2020   MCV 90.6 12/05/2020   PLT 138 (L) 12/05/2020   Lab Results  Component Value Date   NA 143 12/05/2020   K 4.2 12/05/2020   CO2 19 (L) 12/05/2020   GLUCOSE 100 (H) 12/05/2020   BUN 27 (H) 12/05/2020   CREATININE 1.49 (H) 12/05/2020   BILITOT 1.8 (H) 12/05/2020   ALKPHOS 52 10/08/2014   AST 34 12/05/2020   ALT 31 12/05/2020   PROT 6.2 12/05/2020   ALBUMIN 3.5 10/08/2014   CALCIUM 9.1  12/05/2020   ANIONGAP 6 12/23/2017   EGFR 43 (L) 12/05/2020   Lab Results  Component Value Date   CHOL  131 12/05/2020   Lab Results  Component Value Date   HDL 41 12/05/2020   Lab Results  Component Value Date   LDLCALC 71 12/05/2020   Lab Results  Component Value Date   TRIG 103 12/05/2020   Lab Results  Component Value Date   CHOLHDL 3.2 12/05/2020   No results found for: HGBA1C    Assessment & Plan:   Problem List Items Addressed This Visit       Cardiovascular and Mediastinum   Atrial fibrillation ([place]) - Primary    Reviewed Coumadin to 2.5 mg p.o. daily      Relevant Orders   POCT INR (Completed)   Sick sinus syndrome (HCC)    Stable at the present time        Endocrine   Hypothyroidism    Stable        Genitourinary   Benign prostatic hyperplasia with urinary obstruction    Stable at the present time        Other   Hyperlipidemia    Hypercholesterolemia  I advised the patient to follow Mediterranean diet This diet is rich in fruits vegetables and whole grain, and This diet is also rich in fish and lean meat Patient should also eat a handful of almonds or walnuts daily Recent heart study indicated that average follow-up on this kind of diet reduces the cardiovascular mortality by 50 to 70%==      Unsteady gait    Patient is walking pretty stable       No orders of the defined types were placed in this encounter.   Follow-up: No follow-ups on file.    Cletis Athens, MD

## 2021-02-20 NOTE — Assessment & Plan Note (Signed)
Patient is walking pretty stable

## 2021-02-20 NOTE — Assessment & Plan Note (Signed)
Stable at the present time. 

## 2021-02-20 NOTE — Assessment & Plan Note (Signed)
Stable

## 2021-02-20 NOTE — Assessment & Plan Note (Signed)
Reviewed Coumadin to 2.5 mg p.o. daily

## 2021-03-04 ENCOUNTER — Other Ambulatory Visit: Payer: Self-pay

## 2021-03-04 ENCOUNTER — Ambulatory Visit (INDEPENDENT_AMBULATORY_CARE_PROVIDER_SITE_OTHER): Payer: Medicare PPO | Admitting: Internal Medicine

## 2021-03-04 VITALS — BP 122/80 | HR 81 | Ht 76.0 in | Wt 177.3 lb

## 2021-03-04 DIAGNOSIS — E038 Other specified hypothyroidism: Secondary | ICD-10-CM | POA: Diagnosis not present

## 2021-03-04 DIAGNOSIS — I4891 Unspecified atrial fibrillation: Secondary | ICD-10-CM | POA: Diagnosis not present

## 2021-03-04 DIAGNOSIS — N138 Other obstructive and reflux uropathy: Secondary | ICD-10-CM

## 2021-03-04 DIAGNOSIS — R600 Localized edema: Secondary | ICD-10-CM | POA: Diagnosis not present

## 2021-03-04 DIAGNOSIS — I10 Essential (primary) hypertension: Secondary | ICD-10-CM | POA: Diagnosis not present

## 2021-03-04 DIAGNOSIS — E063 Autoimmune thyroiditis: Secondary | ICD-10-CM | POA: Diagnosis not present

## 2021-03-04 DIAGNOSIS — E782 Mixed hyperlipidemia: Secondary | ICD-10-CM

## 2021-03-04 DIAGNOSIS — Z950 Presence of cardiac pacemaker: Secondary | ICD-10-CM | POA: Diagnosis not present

## 2021-03-04 DIAGNOSIS — N401 Enlarged prostate with lower urinary tract symptoms: Secondary | ICD-10-CM

## 2021-03-04 NOTE — Assessment & Plan Note (Signed)
Stable at the present time. 

## 2021-03-04 NOTE — Assessment & Plan Note (Signed)

## 2021-03-04 NOTE — Progress Notes (Signed)
Established Patient Office Visit  Subjective:  Patient ID: Johnny Norman, male    DOB: [birth year]  Age: 88 y.o. MRN: [id number]  CC:  Chief Complaint  Patient presents with   Pacemaker Check    HPI  [name] presents for pacer check/  bp check  Past Medical History:  Diagnosis Date   A-fib [facility])    CHF (congestive heart failure) ([place])    Claudication ([place])    leg   Hyperlipidemia    Peripheral vascular disease ([place])    Presence of permanent cardiac pacemaker    Small bowel obstruction ([place])    Thyroid disease     Past Surgical History:  Procedure Laterality Date   APPENDECTOMY     BOWEL RESECTION  10/12/2014   Procedure: SMALL BOWEL RESECTION;  Surgeon: Hubbard Robinson, MD;  Location: [facility] ORS;  Service: General;;   CHOLECYSTECTOMY     COLONOSCOPY     INSERT / REPLACE / REMOVE PACEMAKER  09/13/2008   SSS s/p pacemaker implant; Medtronic pacemaker,.   LAPAROTOMY N/A 10/12/2014   Procedure: EXPLORATORY LAPAROTOMY;  Surgeon: Hubbard Robinson, MD;  Location: [facility] ORS;  Service: General;  Laterality: N/A;   PACEMAKER INSERTION N/A 12/30/2017   Procedure: GENERATOR CHANGE OUT- DUAL CHAMBER;  Surgeon: Cletis Athens, MD;  Location: [facility] ORS;  Service: Cardiovascular;  Laterality: N/A;   PROSTATE SURGERY      Family History  Family history unknown: Yes    Social History   Socioeconomic History   Marital status: Widowed    Spouse name: Not on file   Number of children: Not on file   Years of education: Not on file   Highest education level: Not on file  Occupational History   Not on file  Tobacco Use   Smoking status: Never   Smokeless tobacco: Never  Vaping Use   Vaping Use: Never used  Substance and Sexual Activity   Alcohol use: No    Comment: occasional   Drug use: No   Sexual activity: Yes    Birth control/protection: None  Other Topics Concern   Not on file  Social History Narrative   Not on file   Social Determinants of  Health   Financial Resource Strain: Not on file  Food Insecurity: Not on file  Transportation Needs: Not on file  Physical Activity: Not on file  Stress: Not on file  Social Connections: Not on file  Intimate Partner Violence: Not on file     Current Outpatient Medications:    atorvastatin (LIPITOR) 10 MG tablet, TAKE ONE TABLET BY MOUTH EVERY DAY, Disp: 90 tablet, Rfl: 3   Cholecalciferol (VITAMIN D) 50 MCG (2000 UT) CAPS, Take 2,000 Units by mouth daily. , Disp: , Rfl:    furosemide (LASIX) 20 MG tablet, TAKE ONE TABLET (20 MG) BY MOUTH EVERY DAY, Disp: 30 tablet, Rfl: 3   levothyroxine (SYNTHROID) 88 MCG tablet, TAKE 1 TABLET EVERY DAY ON EMPTY STOMACHWITH A GLASS OF WATER AT LEAST 30-60 MINBEFORE BREAKFAST, Disp: 90 tablet, Rfl: 3   lisinopril (ZESTRIL) 2.5 MG tablet, TAKE 1 TABLET BY MOUTH DAILY, Disp: 30 tablet, Rfl: 6   Multiple Vitamin (MULTIVITAMIN) tablet, Take 1 tablet by mouth daily., Disp: , Rfl:    nadolol (CORGARD) 20 MG tablet, TAKE 1 TABLET BY MOUTH DAILY, Disp: 90 tablet, Rfl: 3   warfarin (COUMADIN) 5 MG tablet, Take 1 tablet (5 mg total) by mouth daily., Disp: 30 tablet, Rfl: 3  No Known Allergies  ROS Review of Systems  Constitutional: Negative.   HENT: Negative.    Eyes: Negative.   Respiratory: Negative.    Cardiovascular: Negative.   Gastrointestinal: Negative.   Endocrine: Negative.   Genitourinary: Negative.   Musculoskeletal: Negative.   Skin: Negative.   Allergic/Immunologic: Negative.   Neurological: Negative.   Hematological: Negative.   Psychiatric/Behavioral: Negative.    All other systems reviewed and are negative.    Objective:    Physical Exam Vitals reviewed.  Constitutional:      Appearance: Normal appearance.  HENT:     Mouth/Throat:     Mouth: Mucous membranes are moist.  Eyes:     Pupils: Pupils are equal, round, and reactive to light.  Neck:     Vascular: No carotid bruit.  Cardiovascular:     Rate and Rhythm: Normal  rate. Rhythm irregular.     Pulses: Normal pulses.     Heart sounds: Normal heart sounds.  Pulmonary:     Effort: Pulmonary effort is normal. No respiratory distress.     Breath sounds: Normal breath sounds. No wheezing, rhonchi or rales.  Abdominal:     General: Bowel sounds are normal.     Palpations: Abdomen is soft. There is no hepatomegaly, splenomegaly or mass.     Tenderness: There is no abdominal tenderness.     Hernia: No hernia is present.  Musculoskeletal:     Cervical back: Neck supple.     Right lower leg: No edema.     Left lower leg: No edema.  Skin:    Coloration: Skin is not jaundiced.     Findings: No bruising, lesion or rash.  Neurological:     Mental Status: He is alert and oriented to person, place, and time.     Motor: No weakness.  Psychiatric:        Mood and Affect: Mood normal.        Behavior: Behavior normal.    BP 122/80    Pulse 81    Ht _0  (1.93 m)    Wt 177 lb 4.8 oz (80.4 kg)    BMI 21.58 kg/m  Wt Readings from Last 3 Encounters:  03/04/21 177 lb 4.8 oz (80.4 kg)  02/20/21 177 lb 4.8 oz (80.4 kg)  01/03/21 182 lb (82.6 kg)     Health Maintenance Due  Topic Date Due   Zoster Vaccines- Shingrix (1 of 2) Never done   Pneumonia Vaccine 47+ Years old (1 - PCV) Never done   COVID-19 Vaccine (4 - Booster for Pfizer series) 01/16/2020    There are no preventive care reminders to display for this patient.  Lab Results  Component Value Date   TSH 0.72 12/05/2020   Lab Results  Component Value Date   WBC 6.0 12/05/2020   HGB 15.5 12/05/2020   HCT 47.0 12/05/2020   MCV 90.6 12/05/2020   PLT 138 (L) 12/05/2020   Lab Results  Component Value Date   NA 143 12/05/2020   K 4.2 12/05/2020   CO2 19 (L) 12/05/2020   GLUCOSE 100 (H) 12/05/2020   BUN 27 (H) 12/05/2020   CREATININE 1.49 (H) 12/05/2020   BILITOT 1.8 (H) 12/05/2020   ALKPHOS 52 10/08/2014   AST 34 12/05/2020   ALT 31 12/05/2020   PROT 6.2 12/05/2020   ALBUMIN 3.5  10/08/2014   CALCIUM 9.1 12/05/2020   ANIONGAP 6 12/23/2017   EGFR 43 (L) 12/05/2020   Lab Results  Component  Value Date   CHOL 131 12/05/2020   Lab Results  Component Value Date   HDL 41 12/05/2020   Lab Results  Component Value Date   LDLCALC 71 12/05/2020   Lab Results  Component Value Date   TRIG 103 12/05/2020   Lab Results  Component Value Date   CHOLHDL 3.2 12/05/2020   No results found for: HGBA1C    Assessment & Plan:   Problem List Items Addressed This Visit       Cardiovascular and Mediastinum   Atrial fibrillation ([place])    Stable at the present time      Essential hypertension     Patient denies any chest pain or shortness of breath there is no history of palpitation or paroxysmal nocturnal dyspnea   patient was advised to follow low-salt low-cholesterol diet    ideally I want to keep systolic blood pressure below 130 mmHg, patient was asked to check blood pressure one times a week and give me a report on that.  Patient will be follow-up in 3 months  or earlier as needed, patient will call me back for any change in the cardiovascular symptoms Patient was advised to buy a book from local bookstore concerning blood pressure and read several chapters  every day.  This will be supplemented by some of the material we will give him from the office.  Patient should also utilize other resources like YouTube and Internet to learn more about the blood pressure and the diet.        Endocrine   Hypothyroidism     Stable at the present time        Genitourinary   Benign prostatic hyperplasia with urinary obstruction    Chronic         Other   Hyperlipidemia   Bilateral leg edema    Resolved      Other Visit Diagnoses     Cardiac pacemaker in situ    -  Primary   Relevant Orders   PACEMAKER IN CLINIC CHECK      Note: Medical Device Follow-up  Patient pacemaker was interrogated by pacemakers analyzer, battery status is okay.  No programming  changes were indicated after the review of the data.  Histogram shows no change since the last interrogation Atrial and ventricular sensing thresholds were found to be acceptable Impedance was checked and it was found to be normal.  Thresholds were found to be okay on evaluation of rhythm problem.  No high rate or low rate arrhythmia were noted.  Estimated battery longevity is 9.9 yr I have personally reviewed the device data and amended the report as necessary.   No orders of the defined types were placed in this encounter.   Follow-up: No follow-ups on file.    Cletis Athens, MD

## 2021-03-04 NOTE — Assessment & Plan Note (Signed)
Chronic. 

## 2021-03-04 NOTE — Assessment & Plan Note (Signed)
Resolved

## 2021-03-06 ENCOUNTER — Other Ambulatory Visit: Payer: Self-pay

## 2021-03-06 ENCOUNTER — Encounter: Payer: Self-pay | Admitting: Internal Medicine

## 2021-03-06 ENCOUNTER — Ambulatory Visit: Payer: Medicare PPO | Admitting: Internal Medicine

## 2021-03-06 VITALS — BP 108/70 | HR 73 | Ht 72.0 in | Wt 178.2 lb

## 2021-03-06 DIAGNOSIS — I4891 Unspecified atrial fibrillation: Secondary | ICD-10-CM

## 2021-03-06 DIAGNOSIS — N401 Enlarged prostate with lower urinary tract symptoms: Secondary | ICD-10-CM | POA: Diagnosis not present

## 2021-03-06 DIAGNOSIS — I10 Essential (primary) hypertension: Secondary | ICD-10-CM | POA: Diagnosis not present

## 2021-03-06 DIAGNOSIS — E782 Mixed hyperlipidemia: Secondary | ICD-10-CM | POA: Diagnosis not present

## 2021-03-06 DIAGNOSIS — N138 Other obstructive and reflux uropathy: Secondary | ICD-10-CM

## 2021-03-06 DIAGNOSIS — E038 Other specified hypothyroidism: Secondary | ICD-10-CM

## 2021-03-06 DIAGNOSIS — E063 Autoimmune thyroiditis: Secondary | ICD-10-CM | POA: Diagnosis not present

## 2021-03-06 LAB — POCT INR: INR: 1.7 — AB (ref 2.0–3.0)

## 2021-03-06 NOTE — Assessment & Plan Note (Signed)
Stable at the present time. 

## 2021-03-06 NOTE — Assessment & Plan Note (Signed)

## 2021-03-06 NOTE — Assessment & Plan Note (Signed)
Patient was advised to take Synthroid medication.

## 2021-03-06 NOTE — Assessment & Plan Note (Signed)
Hypercholesterolemia  I advised the patient to follow Mediterranean diet This diet is rich in fruits vegetables and whole grain, and This diet is also rich in fish and lean meat Patient should also eat a handful of almonds or walnuts daily Recent heart study indicated that average follow-up on this kind of diet reduces the cardiovascular mortality by 50 to 70%== 

## 2021-03-06 NOTE — Progress Notes (Signed)
Established Patient Office Visit  Subjective:  Patient ID: Johnny Norman, male    DOB: February 02, 1925  Age: 88 y.o. MRN: [id number]  CC:  Chief Complaint  Patient presents with   Atrial Fibrillation    Atrial Fibrillation Past medical history includes atrial fibrillation.   Gilman Jaquelyn Bitter Whalley presents for check up  Past Medical History:  Diagnosis Date   A-fib [facility])    CHF (congestive heart failure) ([place])    Claudication ([place])    leg   Hyperlipidemia    Peripheral vascular disease ([place])    Presence of permanent cardiac pacemaker    Small bowel obstruction (HCC)    Thyroid disease     Past Surgical History:  Procedure Laterality Date   APPENDECTOMY     BOWEL RESECTION  10/12/2014   Procedure: SMALL BOWEL RESECTION;  Surgeon: Hubbard Robinson, MD;  Location: [facility] ORS;  Service: General;;   CHOLECYSTECTOMY     COLONOSCOPY     INSERT / REPLACE / REMOVE PACEMAKER  09/13/2008   SSS s/p pacemaker implant; Medtronic pacemaker,.   LAPAROTOMY N/A 10/12/2014   Procedure: EXPLORATORY LAPAROTOMY;  Surgeon: Hubbard Robinson, MD;  Location: [facility] ORS;  Service: General;  Laterality: N/A;   PACEMAKER INSERTION N/A 12/30/2017   Procedure: GENERATOR CHANGE OUT- DUAL CHAMBER;  Surgeon: Cletis Athens, MD;  Location: [facility] ORS;  Service: Cardiovascular;  Laterality: N/A;   PROSTATE SURGERY      Family History  Family history unknown: Yes    Social History   Socioeconomic History   Marital status: Widowed    Spouse name: Not on file   Number of children: Not on file   Years of education: Not on file   Highest education level: Not on file  Occupational History   Not on file  Tobacco Use   Smoking status: Never   Smokeless tobacco: Never  Vaping Use   Vaping Use: Never used  Substance and Sexual Activity   Alcohol use: No    Comment: occasional   Drug use: No   Sexual activity: Yes    Birth control/protection: None  Other Topics Concern   Not on file  Social  History Narrative   Not on file   Social Determinants of Health   Financial Resource Strain: Not on file  Food Insecurity: Not on file  Transportation Needs: Not on file  Physical Activity: Not on file  Stress: Not on file  Social Connections: Not on file  Intimate Partner Violence: Not on file     Current Outpatient Medications:    atorvastatin (LIPITOR) 10 MG tablet, TAKE ONE TABLET BY MOUTH EVERY DAY, Disp: 90 tablet, Rfl: 3   Cholecalciferol (VITAMIN D) 50 MCG (2000 UT) CAPS, Take 2,000 Units by mouth daily. , Disp: , Rfl:    furosemide (LASIX) 20 MG tablet, TAKE ONE TABLET (20 MG) BY MOUTH EVERY DAY, Disp: 30 tablet, Rfl: 3   levothyroxine (SYNTHROID) 88 MCG tablet, TAKE 1 TABLET EVERY DAY ON EMPTY STOMACHWITH A GLASS OF WATER AT LEAST 30-60 MINBEFORE BREAKFAST, Disp: 90 tablet, Rfl: 3   lisinopril (ZESTRIL) 2.5 MG tablet, TAKE 1 TABLET BY MOUTH DAILY, Disp: 30 tablet, Rfl: 6   Multiple Vitamin (MULTIVITAMIN) tablet, Take 1 tablet by mouth daily., Disp: , Rfl:    nadolol (CORGARD) 20 MG tablet, TAKE 1 TABLET BY MOUTH DAILY, Disp: 90 tablet, Rfl: 3   warfarin (COUMADIN) 5 MG tablet, Take 1 tablet (5 mg total) by mouth daily., Disp: 30  tablet, Rfl: 3   No Known Allergies  ROS Review of Systems  Constitutional: Negative.   HENT: Negative.    Eyes: Negative.   Respiratory: Negative.    Cardiovascular: Negative.   Gastrointestinal: Negative.   Endocrine: Negative.   Genitourinary: Negative.   Musculoskeletal: Negative.   Skin: Negative.   Allergic/Immunologic: Negative.   Neurological: Negative.   Hematological: Negative.   Psychiatric/Behavioral: Negative.    All other systems reviewed and are negative.    Objective:    Physical Exam Vitals reviewed.  Constitutional:      Appearance: Normal appearance.  HENT:     Mouth/Throat:     Mouth: Mucous membranes are moist.  Eyes:     Pupils: Pupils are equal, round, and reactive to light.  Neck:     Vascular: No  carotid bruit.  Cardiovascular:     Rate and Rhythm: Normal rate and regular rhythm.     Pulses: Normal pulses.     Heart sounds: Normal heart sounds.  Pulmonary:     Effort: Pulmonary effort is normal.     Breath sounds: Normal breath sounds.  Abdominal:     General: Bowel sounds are normal.     Palpations: Abdomen is soft. There is no hepatomegaly, splenomegaly or mass.     Tenderness: There is no abdominal tenderness.     Hernia: No hernia is present.  Musculoskeletal:     Cervical back: Neck supple.     Right lower leg: No edema.     Left lower leg: No edema.  Skin:    Findings: No rash.  Neurological:     Mental Status: He is alert and oriented to person, place, and time.     Motor: No weakness.  Psychiatric:        Mood and Affect: Mood normal.        Behavior: Behavior normal.    BP 108/70    Pulse 73    Ht 6' (1.829 m)    Wt 178 lb 3.2 oz (80.8 kg)    BMI 24.17 kg/m  Wt Readings from Last 3 Encounters:  03/06/21 178 lb 3.2 oz (80.8 kg)  03/04/21 177 lb 4.8 oz (80.4 kg)  02/20/21 177 lb 4.8 oz (80.4 kg)     Health Maintenance Due  Topic Date Due   Zoster Vaccines- Shingrix (1 of 2) Never done   Pneumonia Vaccine 12+ Years old (1 - PCV) Never done   COVID-19 Vaccine (4 - Booster for Pfizer series) 01/16/2020    There are no preventive care reminders to display for this patient.  Lab Results  Component Value Date   TSH 0.72 12/05/2020   Lab Results  Component Value Date   WBC 6.0 12/05/2020   HGB 15.5 12/05/2020   HCT 47.0 12/05/2020   MCV 90.6 12/05/2020   PLT 138 (L) 12/05/2020   Lab Results  Component Value Date   NA 143 12/05/2020   K 4.2 12/05/2020   CO2 19 (L) 12/05/2020   GLUCOSE 100 (H) 12/05/2020   BUN 27 (H) 12/05/2020   CREATININE 1.49 (H) 12/05/2020   BILITOT 1.8 (H) 12/05/2020   ALKPHOS 52 10/08/2014   AST 34 12/05/2020   ALT 31 12/05/2020   PROT 6.2 12/05/2020   ALBUMIN 3.5 10/08/2014   CALCIUM 9.1 12/05/2020   ANIONGAP 6  12/23/2017   EGFR 43 (L) 12/05/2020   Lab Results  Component Value Date   CHOL 131 12/05/2020   Lab Results  Component  Value Date   HDL 41 12/05/2020   Lab Results  Component Value Date   LDLCALC 71 12/05/2020   Lab Results  Component Value Date   TRIG 103 12/05/2020   Lab Results  Component Value Date   CHOLHDL 3.2 12/05/2020   No results found for: HGBA1C    Assessment & Plan:   Problem List Items Addressed This Visit       Cardiovascular and Mediastinum   Atrial fibrillation ([place]) - Primary    Stable at the present time      Relevant Orders   POCT INR (Completed)   Essential hypertension     Patient denies any chest pain or shortness of breath there is no history of palpitation or paroxysmal nocturnal dyspnea   patient was advised to follow low-salt low-cholesterol diet    ideally I want to keep systolic blood pressure below 130 mmHg, patient was asked to check blood pressure one times a week and give me a report on that.  Patient will be follow-up in 3 months  or earlier as needed, patient will call me back for any change in the cardiovascular symptoms Patient was advised to buy a book from local bookstore concerning blood pressure and read several chapters  every day.  This will be supplemented by some of the material we will give him from the office.  Patient should also utilize other resources like YouTube and Internet to learn more about the blood pressure and the diet.        Endocrine   Hypothyroidism    Patient was advised to take Synthroid medication.        Genitourinary   Benign prostatic hyperplasia with urinary obstruction     Other   Hyperlipidemia    Hypercholesterolemia  I advised the patient to follow Mediterranean diet This diet is rich in fruits vegetables and whole grain, and This diet is also rich in fish and lean meat Patient should also eat a handful of almonds or walnuts daily Recent heart study indicated that average  follow-up on this kind of diet reduces the cardiovascular mortality by 50 to 70%==       No orders of the defined types were placed in this encounter.   Follow-up: No follow-ups on file.    Cletis Athens, MD

## 2021-03-14 ENCOUNTER — Other Ambulatory Visit: Payer: Self-pay | Admitting: Internal Medicine

## 2021-03-14 DIAGNOSIS — I4891 Unspecified atrial fibrillation: Secondary | ICD-10-CM

## 2021-03-19 DIAGNOSIS — H903 Sensorineural hearing loss, bilateral: Secondary | ICD-10-CM | POA: Diagnosis not present

## 2021-03-26 ENCOUNTER — Other Ambulatory Visit: Payer: Self-pay | Admitting: Internal Medicine

## 2021-04-29 ENCOUNTER — Encounter: Payer: Self-pay | Admitting: Internal Medicine

## 2021-04-29 ENCOUNTER — Ambulatory Visit (INDEPENDENT_AMBULATORY_CARE_PROVIDER_SITE_OTHER): Payer: Medicare PPO | Admitting: Internal Medicine

## 2021-04-29 VITALS — BP 138/90 | HR 86 | Ht 72.0 in | Wt 184.9 lb

## 2021-04-29 DIAGNOSIS — I4891 Unspecified atrial fibrillation: Secondary | ICD-10-CM

## 2021-04-29 DIAGNOSIS — E063 Autoimmune thyroiditis: Secondary | ICD-10-CM

## 2021-04-29 DIAGNOSIS — I10 Essential (primary) hypertension: Secondary | ICD-10-CM

## 2021-04-29 DIAGNOSIS — I495 Sick sinus syndrome: Secondary | ICD-10-CM

## 2021-04-29 DIAGNOSIS — E038 Other specified hypothyroidism: Secondary | ICD-10-CM

## 2021-04-29 LAB — POCT INR: INR: 2 (ref 2.0–3.0)

## 2021-04-29 NOTE — Assessment & Plan Note (Signed)
Stable at the present time. 

## 2021-04-29 NOTE — Assessment & Plan Note (Signed)
protime is therapeutic ?

## 2021-04-29 NOTE — Progress Notes (Signed)
? ?Established Patient Office Visit ? ?Subjective:  ?Patient ID: Johnny Norman, male    DOB: 08/17/1925  Age: 88 y.o. MRN: [id number] ? ?CC:  ?Chief Complaint  ?Patient presents with  ? Atrial Fibrillation  ?  1 month INR check  ? ? ?Atrial Fibrillation ?Past medical history includes atrial fibrillation.  ? ?Johnny Norman presents for protime check ? ?Past Medical History:  ?Diagnosis Date  ? A-fib ([place])   ? CHF (congestive heart failure) ([place])   ? Claudication [facility])   ? leg  ? Hyperlipidemia   ? Peripheral vascular disease ([place])   ? Presence of permanent cardiac pacemaker   ? Small bowel obstruction ([place])   ? Thyroid disease   ? ? ?Past Surgical History:  ?Procedure Laterality Date  ? APPENDECTOMY    ? BOWEL RESECTION  10/12/2014  ? Procedure: SMALL BOWEL RESECTION;  Surgeon: Hubbard Robinson, MD;  Location: [facility] ORS;  Service: General;;  ? CHOLECYSTECTOMY    ? COLONOSCOPY    ? INSERT / REPLACE / REMOVE PACEMAKER  09/13/2008  ? SSS s/p pacemaker implant; Medtronic pacemaker,.  ? LAPAROTOMY N/A 10/12/2014  ? Procedure: EXPLORATORY LAPAROTOMY;  Surgeon: Hubbard Robinson, MD;  Location: [facility] ORS;  Service: General;  Laterality: N/A;  ? PACEMAKER INSERTION N/A 12/30/2017  ? Procedure: GENERATOR CHANGE OUT- DUAL CHAMBER;  Surgeon: Cletis Athens, MD;  Location: [facility] ORS;  Service: Cardiovascular;  Laterality: N/A;  ? PROSTATE SURGERY    ? ? ?Family History  ?Family history unknown: Yes  ? ? ?Social History  ? ?Socioeconomic History  ? Marital status: Widowed  ?  Spouse name: Not on file  ? Number of children: Not on file  ? Years of education: Not on file  ? Highest education level: Not on file  ?Occupational History  ? Not on file  ?Tobacco Use  ? Smoking status: Never  ? Smokeless tobacco: Never  ?Vaping Use  ? Vaping Use: Never used  ?Substance and Sexual Activity  ? Alcohol use: No  ?  Comment: occasional  ? Drug use: No  ? Sexual activity: Yes  ?  Birth control/protection: None  ?Other Topics  Concern  ? Not on file  ?Social History Narrative  ? Not on file  ? ?Social Determinants of Health  ? ?Financial Resource Strain: Not on file  ?Food Insecurity: Not on file  ?Transportation Needs: Not on file  ?Physical Activity: Not on file  ?Stress: Not on file  ?Social Connections: Not on file  ?Intimate Partner Violence: Not on file  ? ? ? ?Current Outpatient Medications:  ?  atorvastatin (LIPITOR) 10 MG tablet, TAKE ONE TABLET BY MOUTH EVERY DAY, Disp: 90 tablet, Rfl: 3 ?  Cholecalciferol (VITAMIN D) 50 MCG (2000 UT) CAPS, Take 2,000 Units by mouth daily. , Disp: , Rfl:  ?  furosemide (LASIX) 20 MG tablet, TAKE ONE TABLET (20 MG) BY MOUTH EVERY DAY, Disp: 30 tablet, Rfl: 3 ?  levothyroxine (SYNTHROID) 88 MCG tablet, TAKE 1 TABLET EVERY DAY ON EMPTY STOMACHWITH A GLASS OF WATER AT LEAST 30-60 MINBEFORE BREAKFAST, Disp: 90 tablet, Rfl: 3 ?  lisinopril (ZESTRIL) 2.5 MG tablet, TAKE 1 TABLET BY MOUTH DAILY, Disp: 30 tablet, Rfl: 6 ?  Multiple Vitamin (MULTIVITAMIN) tablet, Take 1 tablet by mouth daily., Disp: , Rfl:  ?  nadolol (CORGARD) 20 MG tablet, TAKE 1 TABLET BY MOUTH DAILY, Disp: 90 tablet, Rfl: 3 ?  warfarin (COUMADIN) 5 MG tablet, Take 1 tablet (5  mg total) by mouth daily., Disp: 30 tablet, Rfl: 3  ? ?No Known Allergies ? ?ROS ?Review of Systems  ?Constitutional: Negative.   ?HENT: Negative.    ?Eyes: Negative.   ?Respiratory: Negative.    ?Cardiovascular: Negative.   ?Gastrointestinal: Negative.   ?Endocrine: Negative.   ?Genitourinary: Negative.   ?Musculoskeletal: Negative.   ?Skin: Negative.   ?Allergic/Immunologic: Negative.   ?Neurological: Negative.   ?Hematological: Negative.   ?Psychiatric/Behavioral: Negative.    ?All other systems reviewed and are negative. ? ?  ?Objective:  ?  ?Physical Exam ?Vitals reviewed.  ?Constitutional:   ?   General: He is not in acute distress. ?   Appearance: Normal appearance.  ?HENT:  ?   Mouth/Throat:  ?   Mouth: Mucous membranes are moist.  ?Eyes:  ?   Pupils:  Pupils are equal, round, and reactive to light.  ?Neck:  ?   Vascular: No carotid bruit.  ?Cardiovascular:  ?   Rate and Rhythm: Normal rate. Rhythm irregular.  ?   Pulses: Normal pulses.  ?   Heart sounds: Normal heart sounds.  ?Pulmonary:  ?   Effort: Pulmonary effort is normal.  ?   Breath sounds: Normal breath sounds.  ?Abdominal:  ?   General: Bowel sounds are normal.  ?   Palpations: Abdomen is soft. There is no hepatomegaly, splenomegaly or mass.  ?   Tenderness: There is no abdominal tenderness.  ?   Hernia: No hernia is present.  ?Musculoskeletal:  ?   Cervical back: Neck supple.  ?   Right lower leg: No edema.  ?   Left lower leg: No edema.  ?Skin: ?   Findings: No rash.  ?Neurological:  ?   Mental Status: He is alert and oriented to person, place, and time.  ?   Motor: No weakness.  ?Psychiatric:     ?   Mood and Affect: Mood normal.     ?   Behavior: Behavior normal.  ? ? ?BP 138/90   Pulse 86   Ht 6' (1.829 m)   Wt 184 lb 14.4 oz (83.9 kg)   BMI 25.08 kg/m?  ?Wt Readings from Last 3 Encounters:  ?04/29/21 184 lb 14.4 oz (83.9 kg)  ?03/06/21 178 lb 3.2 oz (80.8 kg)  ?03/04/21 177 lb 4.8 oz (80.4 kg)  ? ? ? ?Health Maintenance Due  ?Topic Date Due  ? Zoster Vaccines- Shingrix (1 of 2) Never done  ? Pneumonia Vaccine 12+ Years old (1 - PCV) Never done  ? COVID-19 Vaccine (4 - Booster for Pfizer series) 01/16/2020  ? ? ?There are no preventive care reminders to display for this patient. ? ?Lab Results  ?Component Value Date  ? TSH 0.72 12/05/2020  ? ?Lab Results  ?Component Value Date  ? WBC 6.0 12/05/2020  ? HGB 15.5 12/05/2020  ? HCT 47.0 12/05/2020  ? MCV 90.6 12/05/2020  ? PLT 138 (L) 12/05/2020  ? ?Lab Results  ?Component Value Date  ? NA 143 12/05/2020  ? K 4.2 12/05/2020  ? CO2 19 (L) 12/05/2020  ? GLUCOSE 100 (H) 12/05/2020  ? BUN 27 (H) 12/05/2020  ? CREATININE 1.49 (H) 12/05/2020  ? BILITOT 1.8 (H) 12/05/2020  ? ALKPHOS 52 10/08/2014  ? AST 34 12/05/2020  ? ALT 31 12/05/2020  ? PROT 6.2  12/05/2020  ? ALBUMIN 3.5 10/08/2014  ? CALCIUM 9.1 12/05/2020  ? ANIONGAP 6 12/23/2017  ? EGFR 43 (L) 12/05/2020  ? ?Lab Results  ?Component Value  Date  ? CHOL 131 12/05/2020  ? ?Lab Results  ?Component Value Date  ? HDL 41 12/05/2020  ? ?Lab Results  ?Component Value Date  ? [place] 71 12/05/2020  ? ?Lab Results  ?Component Value Date  ? TRIG 103 12/05/2020  ? ?Lab Results  ?Component Value Date  ? CHOLHDL 3.2 12/05/2020  ? ?No results found for: HGBA1C ? ?  ?Assessment & Plan:  ? ?Problem List Items Addressed This Visit   ? ?  ? Cardiovascular and Mediastinum  ? Atrial fibrillation ([place]) - Primary  ?  protime is therapeutic ?  ?  ? Relevant Orders  ? POCT INR (Completed)  ? Sick sinus syndrome ([place])  ?  stable ?  ?  ? Essential hypertension  ?   Patient denies any chest pain or shortness of breath there is no history of palpitation or paroxysmal nocturnal dyspnea ?  patient was advised to follow low-salt low-cholesterol diet ? ?  ideally I want to keep systolic blood pressure below 130 mmHg, patient was asked to check blood pressure one times a week and give me a report on that.  Patient will be follow-up in 3 months  or earlier as needed, patient will call me back for any change in the cardiovascular symptoms ?Patient was advised to buy a book from local bookstore concerning blood pressure and read several chapters  every day.  This will be supplemented by some of the material we will give him from the office.  Patient should also utilize other resources like YouTube and Internet to learn more about the blood pressure and the diet. ?  ?  ?  ? Endocrine  ? Hypothyroidism  ?  Stable at the present time ?  ?  ? ? ?No orders of the defined types were placed in this encounter. ? ? ?Follow-up: No follow-ups on file.  ? ? ?Cletis Athens, MD ?

## 2021-04-29 NOTE — Assessment & Plan Note (Signed)

## 2021-04-29 NOTE — Assessment & Plan Note (Signed)
stable °

## 2021-05-13 ENCOUNTER — Ambulatory Visit (INDEPENDENT_AMBULATORY_CARE_PROVIDER_SITE_OTHER): Payer: Medicare PPO

## 2021-05-13 DIAGNOSIS — Z20822 Contact with and (suspected) exposure to covid-19: Secondary | ICD-10-CM

## 2021-05-13 LAB — POC COVID19 BINAXNOW: SARS Coronavirus 2 Ag: NEGATIVE

## 2021-05-15 ENCOUNTER — Other Ambulatory Visit: Payer: Self-pay | Admitting: Internal Medicine

## 2021-05-29 ENCOUNTER — Ambulatory Visit (INDEPENDENT_AMBULATORY_CARE_PROVIDER_SITE_OTHER): Payer: Medicare PPO | Admitting: Internal Medicine

## 2021-05-29 ENCOUNTER — Encounter: Payer: Self-pay | Admitting: Internal Medicine

## 2021-05-29 VITALS — BP 114/73 | HR 85 | Ht 72.0 in | Wt 172.5 lb

## 2021-05-29 DIAGNOSIS — I10 Essential (primary) hypertension: Secondary | ICD-10-CM

## 2021-05-29 DIAGNOSIS — I4891 Unspecified atrial fibrillation: Secondary | ICD-10-CM | POA: Diagnosis not present

## 2021-05-29 DIAGNOSIS — J3489 Other specified disorders of nose and nasal sinuses: Secondary | ICD-10-CM

## 2021-05-29 LAB — POCT INR: INR: 2 (ref 2.0–3.0)

## 2021-05-29 NOTE — Progress Notes (Signed)
? ?Established Patient Office Visit ? ?Subjective:  ?Patient ID: Johnny Norman, male    DOB: 15-Apr-1925  Age: 88 y.o. MRN: [id number] ? ?CC:  ?Chief Complaint  ?Patient presents with  ? Atrial Fibrillation  ? ? ?Atrial Fibrillation ?Past medical history includes atrial fibrillation.  ? ?Johnny Norman Johnny Norman presents for at fib check ? ?Past Medical History:  ?Diagnosis Date  ? A-fib (Johnny Norman)   ? CHF (congestive heart failure) (Johnny Norman)   ? Claudication [facility])   ? leg  ? Hyperlipidemia   ? Peripheral vascular disease (Johnny Norman)   ? Presence of permanent cardiac pacemaker   ? Small bowel obstruction (Johnny Norman)   ? Thyroid disease   ? ? ?Past Surgical History:  ?Procedure Laterality Date  ? APPENDECTOMY    ? BOWEL RESECTION  10/12/2014  ? Procedure: SMALL BOWEL RESECTION;  Surgeon: Hubbard Robinson, MD;  Location: [facility] ORS;  Service: General;;  ? CHOLECYSTECTOMY    ? COLONOSCOPY    ? INSERT / REPLACE / REMOVE PACEMAKER  09/13/2008  ? SSS s/p pacemaker implant; Medtronic pacemaker,.  ? LAPAROTOMY N/A 10/12/2014  ? Procedure: EXPLORATORY LAPAROTOMY;  Surgeon: Hubbard Robinson, MD;  Location: [facility] ORS;  Service: General;  Laterality: N/A;  ? PACEMAKER INSERTION N/A 12/30/2017  ? Procedure: GENERATOR CHANGE OUT- DUAL CHAMBER;  Surgeon: Johnny Athens, MD;  Location: [facility] ORS;  Service: Cardiovascular;  Laterality: N/A;  ? PROSTATE SURGERY    ? ? ?Family History  ?Family history unknown: Yes  ? ? ?Social History  ? ?Socioeconomic History  ? Marital status: Widowed  ?  Spouse name: Not on file  ? Number of children: Not on file  ? Years of education: Not on file  ? Highest education level: Not on file  ?Occupational History  ? Not on file  ?Tobacco Use  ? Smoking status: Never  ? Smokeless tobacco: Never  ?Vaping Use  ? Vaping Use: Never used  ?Substance and Sexual Activity  ? Alcohol use: No  ?  Comment: occasional  ? Drug use: No  ? Sexual activity: Yes  ?  Birth control/protection: None  ?Other Topics Concern  ? Not on file   ?Social History Narrative  ? Not on file  ? ?Social Determinants of Health  ? ?Financial Resource Strain: Not on file  ?Food Insecurity: Not on file  ?Transportation Needs: Not on file  ?Physical Activity: Not on file  ?Stress: Not on file  ?Social Connections: Not on file  ?Intimate Partner Violence: Not on file  ? ? ? ?Current Outpatient Medications:  ?  atorvastatin (LIPITOR) 10 MG tablet, TAKE ONE TABLET BY MOUTH EVERY DAY, Disp: 90 tablet, Rfl: 3 ?  Cholecalciferol (VITAMIN D) 50 MCG (2000 UT) CAPS, Take 2,000 Units by mouth daily. , Disp: , Rfl:  ?  furosemide (LASIX) 20 MG tablet, TAKE ONE TABLET (20 MG) BY MOUTH EVERY DAY, Disp: 30 tablet, Rfl: 3 ?  levothyroxine (SYNTHROID) 88 MCG tablet, TAKE 1 TABLET EVERY DAY ON EMPTY STOMACHWITH A GLASS OF WATER AT LEAST 30-60 MINBEFORE BREAKFAST, Disp: 90 tablet, Rfl: 3 ?  lisinopril (ZESTRIL) 2.5 MG tablet, TAKE 1 TABLET BY MOUTH DAILY, Disp: 30 tablet, Rfl: 6 ?  Multiple Vitamin (MULTIVITAMIN) tablet, Take 1 tablet by mouth daily., Disp: , Rfl:  ?  nadolol (CORGARD) 20 MG tablet, TAKE 1 TABLET BY MOUTH DAILY, Disp: 90 tablet, Rfl: 3 ?  warfarin (COUMADIN) 5 MG tablet, Take 1 tablet (5 mg total) by mouth daily., Disp:  30 tablet, Rfl: 3  ? ?No Known Allergies ? ?ROS ?Review of Systems  ?Constitutional: Negative.   ?HENT: Negative.    ?Eyes: Negative.   ?Respiratory: Negative.    ?Cardiovascular: Negative.   ?Gastrointestinal: Negative.   ?Endocrine: Negative.   ?Genitourinary: Negative.   ?Musculoskeletal: Negative.   ?Skin: Negative.   ?Allergic/Immunologic: Negative.   ?Neurological: Negative.   ?Hematological: Negative.   ?Psychiatric/Behavioral: Negative.    ?All other systems reviewed and are negative. ? ?  ?Objective:  ?  ?Physical Exam ?Vitals reviewed.  ?Constitutional:   ?   Appearance: Normal appearance.  ?HENT:  ?   Mouth/Throat:  ?   Mouth: Mucous membranes are moist.  ?Eyes:  ?   Pupils: Pupils are equal, round, and reactive to light.  ?Neck:  ?    Vascular: No carotid bruit.  ?Cardiovascular:  ?   Rate and Rhythm: Normal rate and regular rhythm.  ?   Pulses: Normal pulses.  ?   Heart sounds: Normal heart sounds.  ?Pulmonary:  ?   Effort: Pulmonary effort is normal.  ?   Breath sounds: Normal breath sounds.  ?Abdominal:  ?   General: Bowel sounds are normal.  ?   Palpations: Abdomen is soft. There is no hepatomegaly, splenomegaly or mass.  ?   Tenderness: There is no abdominal tenderness.  ?   Hernia: No hernia is present.  ?Musculoskeletal:  ?   Cervical back: Neck supple.  ?   Right lower leg: No edema.  ?   Left lower leg: No edema.  ?Skin: ?   Findings: No rash.  ?Neurological:  ?   Mental Status: He is alert and oriented to person, place, and time.  ?   Motor: No weakness.  ?Psychiatric:     ?   Mood and Affect: Mood normal.     ?   Behavior: Behavior normal.  ? ? ?BP 114/73   Pulse 85   Ht 6' (1.829 m)   Wt 172 lb 8 oz (78.2 kg)   BMI 23.40 kg/m?  ?Wt Readings from Last 3 Encounters:  ?05/29/21 172 lb 8 oz (78.2 kg)  ?04/29/21 184 lb 14.4 oz (83.9 kg)  ?03/06/21 178 lb 3.2 oz (80.8 kg)  ? ? ? ?Health Maintenance Due  ?Topic Date Due  ? Zoster Vaccines- Shingrix (1 of 2) Never done  ? Pneumonia Vaccine 25+ Years old (1 - PCV) Never done  ? COVID-19 Vaccine (4 - Booster for Pfizer series) 01/16/2020  ? ? ?There are no preventive care reminders to display for this patient. ? ?Lab Results  ?Component Value Date  ? TSH 0.72 12/05/2020  ? ?Lab Results  ?Component Value Date  ? WBC 6.0 12/05/2020  ? HGB 15.5 12/05/2020  ? HCT 47.0 12/05/2020  ? MCV 90.6 12/05/2020  ? PLT 138 (L) 12/05/2020  ? ?Lab Results  ?Component Value Date  ? NA 143 12/05/2020  ? K 4.2 12/05/2020  ? CO2 19 (L) 12/05/2020  ? GLUCOSE 100 (H) 12/05/2020  ? BUN 27 (H) 12/05/2020  ? CREATININE 1.49 (H) 12/05/2020  ? BILITOT 1.8 (H) 12/05/2020  ? ALKPHOS 52 10/08/2014  ? AST 34 12/05/2020  ? ALT 31 12/05/2020  ? PROT 6.2 12/05/2020  ? ALBUMIN 3.5 10/08/2014  ? CALCIUM 9.1 12/05/2020  ?  ANIONGAP 6 12/23/2017  ? EGFR 43 (L) 12/05/2020  ? ?Lab Results  ?Component Value Date  ? CHOL 131 12/05/2020  ? ?Lab Results  ?Component Value Date  ?  HDL 41 12/05/2020  ? ?Lab Results  ?Component Value Date  ? [place] 71 12/05/2020  ? ?Lab Results  ?Component Value Date  ? TRIG 103 12/05/2020  ? ?Lab Results  ?Component Value Date  ? CHOLHDL 3.2 12/05/2020  ? ?No results found for: HGBA1C ? ?  ?Assessment & Plan:  ? ?Problem List Items Addressed This Visit   ? ?  ? Cardiovascular and Mediastinum  ? Atrial fibrillation ([place]) - Primary  ?  Stable at the present time, no syncope no palpitation, no swelling of the legs ? ? ? ? ? ? ? ?  ?  ? Relevant Orders  ? POCT INR (Completed)  ? Essential hypertension  ?   Patient denies any chest pain or shortness of breath there is no history of palpitation or paroxysmal nocturnal dyspnea ?  patient was advised to follow low-salt low-cholesterol diet ? ?  ideally I want to keep systolic blood pressure below 130 mmHg, patient was asked to check blood pressure one times a week and give me a report on that.  Patient will be follow-up in 3 months  or earlier as needed, patient will call me back for any change in the cardiovascular symptoms ?Patient was advised to buy a book from local bookstore concerning blood pressure and read several chapters  every day.  This will be supplemented by some of the material we will give him from the office.  Patient should also utilize other resources like YouTube and Internet to learn more about the blood pressure and the diet. ? ?  ?  ?  ? Other  ? Rhinorrhea  ?  Take Claritin 5 mg as needed ? ?  ?  ? ? ?No orders of the defined types were placed in this encounter. ? ? ?Follow-up: No follow-ups on file.  ? ? ?Johnny Athens, MD ?

## 2021-05-29 NOTE — Assessment & Plan Note (Signed)

## 2021-05-29 NOTE — Assessment & Plan Note (Signed)
Stable at the present time, no syncope no palpitation, no swelling of the legs ? ? ? ? ? ? ?

## 2021-05-29 NOTE — Assessment & Plan Note (Signed)
Take Claritin 5 mg as needed °

## 2021-06-03 ENCOUNTER — Ambulatory Visit (INDEPENDENT_AMBULATORY_CARE_PROVIDER_SITE_OTHER): Payer: Medicare PPO | Admitting: Internal Medicine

## 2021-06-03 VITALS — BP 114/77 | HR 93 | Ht 72.0 in | Wt 172.5 lb

## 2021-06-03 DIAGNOSIS — E782 Mixed hyperlipidemia: Secondary | ICD-10-CM

## 2021-06-03 DIAGNOSIS — Z950 Presence of cardiac pacemaker: Secondary | ICD-10-CM

## 2021-06-03 DIAGNOSIS — E038 Other specified hypothyroidism: Secondary | ICD-10-CM | POA: Diagnosis not present

## 2021-06-03 DIAGNOSIS — I4811 Longstanding persistent atrial fibrillation: Secondary | ICD-10-CM | POA: Diagnosis not present

## 2021-06-03 DIAGNOSIS — I10 Essential (primary) hypertension: Secondary | ICD-10-CM | POA: Diagnosis not present

## 2021-06-03 DIAGNOSIS — I495 Sick sinus syndrome: Secondary | ICD-10-CM

## 2021-06-03 DIAGNOSIS — E063 Autoimmune thyroiditis: Secondary | ICD-10-CM | POA: Diagnosis not present

## 2021-06-03 NOTE — Assessment & Plan Note (Signed)
Hypercholesterolemia  I advised the patient to follow Mediterranean diet This diet is rich in fruits vegetables and whole grain, and This diet is also rich in fish and lean meat Patient should also eat a handful of almonds or walnuts daily Recent heart study indicated that average follow-up on this kind of diet reduces the cardiovascular mortality by 50 to 70%== 

## 2021-06-03 NOTE — Progress Notes (Signed)
? ?Established Patient Office Visit ? ?Subjective:  ?Patient ID: Johnny Norman, male    DOB: Feb 18, 1925  Age: 88 y.o. MRN: [id number] ? ?CC:  ?Chief Complaint  ?Patient presents with  ? Pacemaker Check  ? ? ?HPI ? ?Johnny Norman presents for pacemaker checkup and dizziness he denies any chest pain or shortness of breath.  He does not smoke does not drink.  He is taking his medicine on a regular basis, medicine past medical history as listed below ? ?Past Medical History:  ?Diagnosis Date  ? A-fib ([place])   ? CHF (congestive heart failure) ([place])   ? Claudication [facility])   ? leg  ? Hyperlipidemia   ? Peripheral vascular disease ([place])   ? Presence of permanent cardiac pacemaker   ? Small bowel obstruction ([place])   ? Thyroid disease   ? ? ?Past Surgical History:  ?Procedure Laterality Date  ? APPENDECTOMY    ? BOWEL RESECTION  10/12/2014  ? Procedure: SMALL BOWEL RESECTION;  Surgeon: Hubbard Robinson, MD;  Location: [facility] ORS;  Service: General;;  ? CHOLECYSTECTOMY    ? COLONOSCOPY    ? INSERT / REPLACE / REMOVE PACEMAKER  09/13/2008  ? SSS s/p pacemaker implant; Medtronic pacemaker,.  ? LAPAROTOMY N/A 10/12/2014  ? Procedure: EXPLORATORY LAPAROTOMY;  Surgeon: Hubbard Robinson, MD;  Location: [facility] ORS;  Service: General;  Laterality: N/A;  ? PACEMAKER INSERTION N/A 12/30/2017  ? Procedure: GENERATOR CHANGE OUT- DUAL CHAMBER;  Surgeon: Cletis Athens, MD;  Location: [facility] ORS;  Service: Cardiovascular;  Laterality: N/A;  ? PROSTATE SURGERY    ? ? ?Family History  ?Family history unknown: Yes  ? ? ?Social History  ? ?Socioeconomic History  ? Marital status: Widowed  ?  Spouse name: Not on file  ? Number of children: Not on file  ? Years of education: Not on file  ? Highest education level: Not on file  ?Occupational History  ? Not on file  ?Tobacco Use  ? Smoking status: Never  ? Smokeless tobacco: Never  ?Vaping Use  ? Vaping Use: Never used  ?Substance and Sexual Activity  ? Alcohol use: No  ?  Comment:  occasional  ? Drug use: No  ? Sexual activity: Yes  ?  Birth control/protection: None  ?Other Topics Concern  ? Not on file  ?Social History Narrative  ? Not on file  ? ?Social Determinants of Health  ? ?Financial Resource Strain: Not on file  ?Food Insecurity: Not on file  ?Transportation Needs: Not on file  ?Physical Activity: Not on file  ?Stress: Not on file  ?Social Connections: Not on file  ?Intimate Partner Violence: Not on file  ? ? ? ?Current Outpatient Medications:  ?  atorvastatin (LIPITOR) 10 MG tablet, TAKE ONE TABLET BY MOUTH EVERY DAY, Disp: 90 tablet, Rfl: 3 ?  Cholecalciferol (VITAMIN D) 50 MCG (2000 UT) CAPS, Take 2,000 Units by mouth daily. , Disp: , Rfl:  ?  furosemide (LASIX) 20 MG tablet, TAKE ONE TABLET (20 MG) BY MOUTH EVERY DAY, Disp: 30 tablet, Rfl: 3 ?  levothyroxine (SYNTHROID) 88 MCG tablet, TAKE 1 TABLET EVERY DAY ON EMPTY STOMACHWITH A GLASS OF WATER AT LEAST 30-60 MINBEFORE BREAKFAST, Disp: 90 tablet, Rfl: 3 ?  lisinopril (ZESTRIL) 2.5 MG tablet, TAKE 1 TABLET BY MOUTH DAILY, Disp: 30 tablet, Rfl: 6 ?  Multiple Vitamin (MULTIVITAMIN) tablet, Take 1 tablet by mouth daily., Disp: , Rfl:  ?  nadolol (CORGARD) 20 MG tablet, TAKE  1 TABLET BY MOUTH DAILY, Disp: 90 tablet, Rfl: 3 ?  warfarin (COUMADIN) 5 MG tablet, Take 1 tablet (5 mg total) by mouth daily., Disp: 30 tablet, Rfl: 3  ? ?No Known Allergies ? ?ROS ?Review of Systems  ?Constitutional: Negative.   ?HENT: Negative.    ?Eyes: Negative.   ?Respiratory: Negative.    ?Cardiovascular: Negative.   ?Gastrointestinal: Negative.   ?Endocrine: Negative.   ?Genitourinary: Negative.   ?Musculoskeletal: Negative.   ?Skin: Negative.   ?Allergic/Immunologic: Negative.   ?Neurological: Negative.   ?Hematological: Negative.   ?Psychiatric/Behavioral: Negative.    ?All other systems reviewed and are negative. ? ?  ?Objective:  ?  ?Physical Exam ?Vitals reviewed.  ?Constitutional:   ?   Appearance: Normal appearance.  ?HENT:  ?   Mouth/Throat:  ?    Mouth: Mucous membranes are moist.  ?Eyes:  ?   Pupils: Pupils are equal, round, and reactive to light.  ?Neck:  ?   Vascular: No carotid bruit.  ?Cardiovascular:  ?   Rate and Rhythm: Normal rate and regular rhythm.  ?   Pulses: Normal pulses.  ?   Heart sounds: Normal heart sounds.  ?Pulmonary:  ?   Effort: Pulmonary effort is normal.  ?   Breath sounds: Normal breath sounds.  ?Abdominal:  ?   General: Bowel sounds are normal.  ?   Palpations: Abdomen is soft. There is no hepatomegaly, splenomegaly or mass.  ?   Tenderness: There is no abdominal tenderness.  ?   Hernia: No hernia is present.  ?Musculoskeletal:  ?   Cervical back: Neck supple.  ?   Right lower leg: No edema.  ?   Left lower leg: No edema.  ?Skin: ?   Findings: No rash.  ?Neurological:  ?   Mental Status: He is alert and oriented to person, place, and time.  ?   Motor: No weakness.  ?Psychiatric:     ?   Mood and Affect: Mood normal.     ?   Behavior: Behavior normal.  ? ? ?BP 114/77   Pulse 93   Ht 6' (1.829 m)   Wt 172 lb 8 oz (78.2 kg)   BMI 23.40 kg/m?  ?Wt Readings from Last 3 Encounters:  ?06/03/21 172 lb 8 oz (78.2 kg)  ?05/29/21 172 lb 8 oz (78.2 kg)  ?04/29/21 184 lb 14.4 oz (83.9 kg)  ? ? ? ?Health Maintenance Due  ?Topic Date Due  ? Zoster Vaccines- Shingrix (1 of 2) Never done  ? Pneumonia Vaccine 61+ Years old (1 - PCV) Never done  ? COVID-19 Vaccine (4 - Booster for Pfizer series) 01/16/2020  ? ? ?There are no preventive care reminders to display for this patient. ? ?Lab Results  ?Component Value Date  ? TSH 0.72 12/05/2020  ? ?Lab Results  ?Component Value Date  ? WBC 6.0 12/05/2020  ? HGB 15.5 12/05/2020  ? HCT 47.0 12/05/2020  ? MCV 90.6 12/05/2020  ? PLT 138 (L) 12/05/2020  ? ?Lab Results  ?Component Value Date  ? NA 143 12/05/2020  ? K 4.2 12/05/2020  ? CO2 19 (L) 12/05/2020  ? GLUCOSE 100 (H) 12/05/2020  ? BUN 27 (H) 12/05/2020  ? CREATININE 1.49 (H) 12/05/2020  ? BILITOT 1.8 (H) 12/05/2020  ? ALKPHOS 52 10/08/2014  ? AST 34  12/05/2020  ? ALT 31 12/05/2020  ? PROT 6.2 12/05/2020  ? ALBUMIN 3.5 10/08/2014  ? CALCIUM 9.1 12/05/2020  ? ANIONGAP 6 12/23/2017  ?  EGFR 43 (L) 12/05/2020  ? ?Lab Results  ?Component Value Date  ? CHOL 131 12/05/2020  ? ?Lab Results  ?Component Value Date  ? HDL 41 12/05/2020  ? ?Lab Results  ?Component Value Date  ? [place] 71 12/05/2020  ? ?Lab Results  ?Component Value Date  ? TRIG 103 12/05/2020  ? ?Lab Results  ?Component Value Date  ? CHOLHDL 3.2 12/05/2020  ? ?No results found for: HGBA1C ? ?  ?Assessment & Plan:  ? ?Problem List Items Addressed This Visit   ? ?  ? Cardiovascular and Mediastinum  ? Atrial fibrillation ([place])  ?  Atrial fibrillation is under control patient does not have a syncope. ? ?  ?  ? Sick sinus syndrome ([place])  ?  No history of syncope. ? ?  ?  ? Essential hypertension  ?   Patient denies any chest pain or shortness of breath there is no history of palpitation or paroxysmal nocturnal dyspnea ?  patient was advised to follow low-salt low-cholesterol diet ? ?  ideally I want to keep systolic blood pressure below 130 mmHg, patient was asked to check blood pressure one times a week and give me a report on that.  Patient will be follow-up in 3 months  or earlier as needed, patient will call me back for any change in the cardiovascular symptoms ?Patient was advised to buy a book from local bookstore concerning blood pressure and read several chapters  every day.  This will be supplemented by some of the material we will give him from the office.  Patient should also utilize other resources like YouTube and Internet to learn more about the blood pressure and the diet. ? ?  ?  ?  ? Endocrine  ? Hypothyroidism  ?  Take thyroid medication first thing in the morning without breakfast ? ?  ?  ?  ? Other  ? Hyperlipidemia  ?  Hypercholesterolemia ? ?I advised the patient to follow Mediterranean diet ?This diet is rich in fruits vegetables and whole grain, and ?This diet is also rich in fish and  lean meat ?Patient should also eat a handful of almonds or walnuts daily ?Recent heart study indicated that average follow-up on this kind of diet reduces the cardiovascular mortality by 50 to 70%== ? ?  ?  ? Pres

## 2021-06-03 NOTE — Assessment & Plan Note (Signed)
Assessment & Plan: ? ?Note: Medical Device Follow-up ? ?Patient pacemaker was interrogated by pacemakers analyzer, battery status is okay.  No programming changes were indicated after the review of the data.  Histogram shows no change since the last interrogation ?Atrial and ventricular sensing thresholds were found to be acceptable ?Impedance was checked and it was found to be normal.  Thresholds were found to be okay on evaluation of rhythm problem.  No high rate or low rate arrhythmia were noted.  Estimated battery longevity is 9.7 years. I have personally reviewed the device data and amended the report as necessary.  ?

## 2021-06-03 NOTE — Assessment & Plan Note (Signed)
Atrial fibrillation is under control patient does not have a syncope. ?

## 2021-06-03 NOTE — Assessment & Plan Note (Signed)
No history of syncope. ?

## 2021-06-03 NOTE — Assessment & Plan Note (Signed)

## 2021-06-03 NOTE — Assessment & Plan Note (Signed)
Take thyroid medication first thing in the morning without breakfast ?

## 2021-06-14 ENCOUNTER — Other Ambulatory Visit: Payer: Self-pay | Admitting: Internal Medicine

## 2021-06-14 DIAGNOSIS — I4891 Unspecified atrial fibrillation: Secondary | ICD-10-CM

## 2021-06-19 ENCOUNTER — Other Ambulatory Visit: Payer: Self-pay

## 2021-07-01 ENCOUNTER — Ambulatory Visit (INDEPENDENT_AMBULATORY_CARE_PROVIDER_SITE_OTHER): Payer: Medicare PPO | Admitting: Internal Medicine

## 2021-07-01 ENCOUNTER — Encounter: Payer: Self-pay | Admitting: Internal Medicine

## 2021-07-01 VITALS — BP 133/93 | HR 76 | Ht 72.0 in | Wt 180.0 lb

## 2021-07-01 DIAGNOSIS — E038 Other specified hypothyroidism: Secondary | ICD-10-CM | POA: Diagnosis not present

## 2021-07-01 DIAGNOSIS — E782 Mixed hyperlipidemia: Secondary | ICD-10-CM

## 2021-07-01 DIAGNOSIS — I10 Essential (primary) hypertension: Secondary | ICD-10-CM

## 2021-07-01 DIAGNOSIS — E063 Autoimmune thyroiditis: Secondary | ICD-10-CM

## 2021-07-01 DIAGNOSIS — R972 Elevated prostate specific antigen [PSA]: Secondary | ICD-10-CM

## 2021-07-01 DIAGNOSIS — I4891 Unspecified atrial fibrillation: Secondary | ICD-10-CM | POA: Diagnosis not present

## 2021-07-01 LAB — POCT INR: INR: 1.9 — AB (ref 2.0–3.0)

## 2021-07-01 NOTE — Assessment & Plan Note (Signed)
Hypercholesterolemia  I advised the patient to follow Mediterranean diet This diet is rich in fruits vegetables and whole grain, and This diet is also rich in fish and lean meat Patient should also eat a handful of almonds or walnuts daily Recent heart study indicated that average follow-up on this kind of diet reduces the cardiovascular mortality by 50 to 70%== 

## 2021-07-01 NOTE — Assessment & Plan Note (Signed)
Atrial fibrillation is under control, pro time is therapeutic

## 2021-07-01 NOTE — Assessment & Plan Note (Signed)
Patient was advised to take his medicine in the morning before breakfast

## 2021-07-01 NOTE — Assessment & Plan Note (Signed)
Refer to urologist 

## 2021-07-01 NOTE — Assessment & Plan Note (Signed)
Blood pressure is under control there is no swelling of the legs chest is clear

## 2021-07-01 NOTE — Progress Notes (Signed)
Established Patient Office Visit  Subjective:  Patient ID: Johnny Norman, male    DOB: 1925/02/17  Age: 88 y.o. MRN: [id number]  CC:  Chief Complaint  Patient presents with   Atrial Fibrillation    Atrial Fibrillation Past medical history includes atrial fibrillation.   Johnny Norman presents for general checkup.  Patient is known to have heart failure pain in the leg and walking dyslipidemia and peripheral vascular disease.  Patient has a permanent pacemaker.  Past Medical History:  Diagnosis Date   A-fib [facility])    CHF (congestive heart failure) (HCC)    Claudication (HCC)    leg   Hyperlipidemia    Peripheral vascular disease (HCC)    Presence of permanent cardiac pacemaker    Small bowel obstruction (HCC)    Thyroid disease     Past Surgical History:  Procedure Laterality Date   APPENDECTOMY     BOWEL RESECTION  10/12/2014   Procedure: SMALL BOWEL RESECTION;  Surgeon: Hubbard Robinson, MD;  Location: [facility] ORS;  Service: General;;   CHOLECYSTECTOMY     COLONOSCOPY     INSERT / REPLACE / REMOVE PACEMAKER  09/13/2008   SSS s/p pacemaker implant; Medtronic pacemaker,.   LAPAROTOMY N/A 10/12/2014   Procedure: EXPLORATORY LAPAROTOMY;  Surgeon: Hubbard Robinson, MD;  Location: [facility] ORS;  Service: General;  Laterality: N/A;   PACEMAKER INSERTION N/A 12/30/2017   Procedure: GENERATOR CHANGE OUT- DUAL CHAMBER;  Surgeon: Cletis Athens, MD;  Location: [facility] ORS;  Service: Cardiovascular;  Laterality: N/A;   PROSTATE SURGERY      Family History  Family history unknown: Yes    Social History   Socioeconomic History   Marital status: Widowed    Spouse name: Not on file   Number of children: Not on file   Years of education: Not on file   Highest education level: Not on file  Occupational History   Not on file  Tobacco Use   Smoking status: Never   Smokeless tobacco: Never  Vaping Use   Vaping Use: Never used  Substance and Sexual Activity    Alcohol use: No    Comment: occasional   Drug use: No   Sexual activity: Yes    Birth control/protection: None  Other Topics Concern   Not on file  Social History Narrative   Not on file   Social Determinants of Health   Financial Resource Strain: Not on file  Food Insecurity: Not on file  Transportation Needs: Not on file  Physical Activity: Not on file  Stress: Not on file  Social Connections: Not on file  Intimate Partner Violence: Not on file     Current Outpatient Medications:    atorvastatin (LIPITOR) 10 MG tablet, TAKE ONE TABLET BY MOUTH EVERY DAY, Disp: 90 tablet, Rfl: 3   Cholecalciferol (VITAMIN D) 50 MCG (2000 UT) CAPS, Take 2,000 Units by mouth daily. , Disp: , Rfl:    furosemide (LASIX) 20 MG tablet, TAKE ONE TABLET (20 MG) BY MOUTH EVERY DAY, Disp: 30 tablet, Rfl: 3   levothyroxine (SYNTHROID) 88 MCG tablet, TAKE 1 TABLET EVERY DAY ON EMPTY STOMACHWITH A GLASS OF WATER AT LEAST 30-60 MINBEFORE BREAKFAST, Disp: 90 tablet, Rfl: 3   lisinopril (ZESTRIL) 2.5 MG tablet, TAKE 1 TABLET BY MOUTH DAILY, Disp: 30 tablet, Rfl: 6   Multiple Vitamin (MULTIVITAMIN) tablet, Take 1 tablet by mouth daily., Disp: , Rfl:    nadolol (CORGARD) 20 MG tablet, TAKE 1 TABLET BY  MOUTH DAILY, Disp: 90 tablet, Rfl: 3   warfarin (COUMADIN) 3 MG tablet, TAKE 1 TABLET BY MOUTH DAILY, Disp: 30 tablet, Rfl: 6   warfarin (COUMADIN) 5 MG tablet, Take 1 tablet (5 mg total) by mouth daily., Disp: 30 tablet, Rfl: 3   No Known Allergies  ROS Review of Systems  Constitutional: Negative.   HENT: Negative.    Eyes: Negative.   Respiratory: Negative.    Cardiovascular: Negative.   Gastrointestinal: Negative.   Endocrine: Negative.   Genitourinary: Negative.   Musculoskeletal: Negative.   Skin: Negative.   Allergic/Immunologic: Negative.   Neurological: Negative.   Hematological: Negative.   Psychiatric/Behavioral: Negative.    All other systems reviewed and are negative.    Objective:     Physical Exam Vitals reviewed.  Constitutional:      Appearance: Normal appearance.  HENT:     Mouth/Throat:     Mouth: Mucous membranes are moist.  Eyes:     Pupils: Pupils are equal, round, and reactive to light.  Neck:     Vascular: No carotid bruit.  Cardiovascular:     Rate and Rhythm: Normal rate. Rhythm irregular.     Pulses: Normal pulses.     Heart sounds: Normal heart sounds.  Pulmonary:     Effort: Pulmonary effort is normal.     Breath sounds: Normal breath sounds.  Abdominal:     General: Bowel sounds are normal.     Palpations: Abdomen is soft. There is no hepatomegaly, splenomegaly or mass.     Tenderness: There is no abdominal tenderness.     Hernia: No hernia is present.  Musculoskeletal:     Cervical back: Neck supple.     Right lower leg: No edema.     Left lower leg: No edema.  Skin:    Findings: No rash.  Neurological:     Mental Status: He is alert and oriented to person, place, and time.     Motor: No weakness.  Psychiatric:        Mood and Affect: Mood normal.        Behavior: Behavior normal.    BP (!) 133/93   Pulse 76   Ht 6' (1.829 m)   Wt 180 lb (81.6 kg)   BMI 24.41 kg/m  Wt Readings from Last 3 Encounters:  07/01/21 180 lb (81.6 kg)  06/03/21 172 lb 8 oz (78.2 kg)  05/29/21 172 lb 8 oz (78.2 kg)     Health Maintenance Due  Topic Date Due   Zoster Vaccines- Shingrix (1 of 2) Never done   Pneumonia Vaccine 41+ Years old (1 - PCV) Never done   COVID-19 Vaccine (4 - Booster for Pfizer series) 01/16/2020    There are no preventive care reminders to display for this patient.  Lab Results  Component Value Date   TSH 0.72 12/05/2020   Lab Results  Component Value Date   WBC 6.0 12/05/2020   HGB 15.5 12/05/2020   HCT 47.0 12/05/2020   MCV 90.6 12/05/2020   PLT 138 (L) 12/05/2020   Lab Results  Component Value Date   NA 143 12/05/2020   K 4.2 12/05/2020   CO2 19 (L) 12/05/2020   GLUCOSE 100 (H) 12/05/2020   BUN 27 (H)  12/05/2020   CREATININE 1.49 (H) 12/05/2020   BILITOT 1.8 (H) 12/05/2020   ALKPHOS 52 10/08/2014   AST 34 12/05/2020   ALT 31 12/05/2020   PROT 6.2 12/05/2020   ALBUMIN 3.5 10/08/2014  CALCIUM 9.1 12/05/2020   ANIONGAP 6 12/23/2017   EGFR 43 (L) 12/05/2020   Lab Results  Component Value Date   CHOL 131 12/05/2020   Lab Results  Component Value Date   HDL 41 12/05/2020   Lab Results  Component Value Date   LDLCALC 71 12/05/2020   Lab Results  Component Value Date   TRIG 103 12/05/2020   Lab Results  Component Value Date   CHOLHDL 3.2 12/05/2020   No results found for: HGBA1C    Assessment & Plan:   Problem List Items Addressed This Visit       Cardiovascular and Mediastinum   Atrial fibrillation ([place]) - Primary    Atrial fibrillation is under control, pro time is therapeutic       Relevant Orders   POCT INR (Completed)   Essential hypertension    Blood pressure is under control there is no swelling of the legs chest is clear         Endocrine   Hypothyroidism    Patient was advised to take his medicine in the morning before breakfast         Other   Hyperlipidemia    Hypercholesterolemia  I advised the patient to follow Mediterranean diet This diet is rich in fruits vegetables and whole grain, and This diet is also rich in fish and lean meat Patient should also eat a handful of almonds or walnuts daily Recent heart study indicated that average follow-up on this kind of diet reduces the cardiovascular mortality by 50 to 70%==       Abnormal prostate specific antigen    Refer to urologist        No orders of the defined types were placed in this encounter.   Follow-up: No follow-ups on file.    Cletis Athens, MD

## 2021-08-05 ENCOUNTER — Ambulatory Visit: Payer: Medicare PPO | Admitting: Internal Medicine

## 2021-08-05 ENCOUNTER — Encounter: Payer: Self-pay | Admitting: Internal Medicine

## 2021-08-05 VITALS — BP 130/80 | HR 84 | Ht 72.0 in | Wt 185.9 lb

## 2021-08-05 DIAGNOSIS — E038 Other specified hypothyroidism: Secondary | ICD-10-CM | POA: Diagnosis not present

## 2021-08-05 DIAGNOSIS — E782 Mixed hyperlipidemia: Secondary | ICD-10-CM | POA: Diagnosis not present

## 2021-08-05 DIAGNOSIS — E063 Autoimmune thyroiditis: Secondary | ICD-10-CM | POA: Diagnosis not present

## 2021-08-05 DIAGNOSIS — I10 Essential (primary) hypertension: Secondary | ICD-10-CM

## 2021-08-05 DIAGNOSIS — I495 Sick sinus syndrome: Secondary | ICD-10-CM

## 2021-08-05 DIAGNOSIS — I4891 Unspecified atrial fibrillation: Secondary | ICD-10-CM | POA: Diagnosis not present

## 2021-08-05 LAB — POCT INR: INR: 2 (ref 2.0–3.0)

## 2021-08-05 NOTE — Assessment & Plan Note (Signed)
Pro time is therapeutic 

## 2021-08-05 NOTE — Progress Notes (Signed)
Established Patient Office Visit  Subjective:  Patient ID: Johnny Norman, male    DOB: December 19, 1925  Age: 88 y.o. MRN: [id number]  CC:  Chief Complaint  Patient presents with   Atrial Fibrillation    1 month INR follow up    Atrial Fibrillation Past medical history includes atrial fibrillation.    Johnny Norman presents for bp check up  Past Medical History:  Diagnosis Date   A-fib [facility])    CHF (congestive heart failure) ([place])    Claudication ([place])    leg   Hyperlipidemia    Peripheral vascular disease ([place])    Presence of permanent cardiac pacemaker    Small bowel obstruction (HCC)    Thyroid disease     Past Surgical History:  Procedure Laterality Date   APPENDECTOMY     BOWEL RESECTION  10/12/2014   Procedure: SMALL BOWEL RESECTION;  Surgeon: Hubbard Robinson, MD;  Location: [facility] ORS;  Service: General;;   CHOLECYSTECTOMY     COLONOSCOPY     INSERT / REPLACE / REMOVE PACEMAKER  09/13/2008   SSS s/p pacemaker implant; Medtronic pacemaker,.   LAPAROTOMY N/A 10/12/2014   Procedure: EXPLORATORY LAPAROTOMY;  Surgeon: Hubbard Robinson, MD;  Location: [facility] ORS;  Service: General;  Laterality: N/A;   PACEMAKER INSERTION N/A 12/30/2017   Procedure: GENERATOR CHANGE OUT- DUAL CHAMBER;  Surgeon: Cletis Athens, MD;  Location: [facility] ORS;  Service: Cardiovascular;  Laterality: N/A;   PROSTATE SURGERY      Family History  Family history unknown: Yes    Social History   Socioeconomic History   Marital status: Widowed    Spouse name: Not on file   Number of children: Not on file   Years of education: Not on file   Highest education level: Not on file  Occupational History   Not on file  Tobacco Use   Smoking status: Never   Smokeless tobacco: Never  Vaping Use   Vaping Use: Never used  Substance and Sexual Activity   Alcohol use: No    Comment: occasional   Drug use: No   Sexual activity: Yes    Birth control/protection: None  Other Topics  Concern   Not on file  Social History Narrative   Not on file   Social Determinants of Health   Financial Resource Strain: Not on file  Food Insecurity: Not on file  Transportation Needs: Not on file  Physical Activity: Not on file  Stress: Not on file  Social Connections: Not on file  Intimate Partner Violence: Not on file     Current Outpatient Medications:    atorvastatin (LIPITOR) 10 MG tablet, TAKE ONE TABLET BY MOUTH EVERY DAY, Disp: 90 tablet, Rfl: 3   Cholecalciferol (VITAMIN D) 50 MCG (2000 UT) CAPS, Take 2,000 Units by mouth daily. , Disp: , Rfl:    furosemide (LASIX) 20 MG tablet, TAKE ONE TABLET (20 MG) BY MOUTH EVERY DAY, Disp: 30 tablet, Rfl: 3   levothyroxine (SYNTHROID) 88 MCG tablet, TAKE 1 TABLET EVERY DAY ON EMPTY STOMACHWITH A GLASS OF WATER AT LEAST 30-60 MINBEFORE BREAKFAST, Disp: 90 tablet, Rfl: 3   lisinopril (ZESTRIL) 2.5 MG tablet, TAKE 1 TABLET BY MOUTH DAILY, Disp: 30 tablet, Rfl: 6   Multiple Vitamin (MULTIVITAMIN) tablet, Take 1 tablet by mouth daily., Disp: , Rfl:    nadolol (CORGARD) 20 MG tablet, TAKE 1 TABLET BY MOUTH DAILY, Disp: 90 tablet, Rfl: 3   warfarin (COUMADIN) 3 MG tablet, TAKE  1 TABLET BY MOUTH DAILY, Disp: 30 tablet, Rfl: 6   warfarin (COUMADIN) 5 MG tablet, Take 1 tablet (5 mg total) by mouth daily., Disp: 30 tablet, Rfl: 3   No Known Allergies  ROS Review of Systems  Constitutional: Negative.   HENT: Negative.    Eyes: Negative.   Respiratory: Negative.    Cardiovascular: Negative.   Gastrointestinal: Negative.   Endocrine: Negative.   Genitourinary: Negative.   Musculoskeletal: Negative.   Skin: Negative.   Allergic/Immunologic: Negative.   Neurological: Negative.   Hematological: Negative.   Psychiatric/Behavioral: Negative.    All other systems reviewed and are negative.     Objective:    Physical Exam Vitals reviewed.  Constitutional:      Appearance: Normal appearance.  HENT:     Mouth/Throat:     Mouth:  Mucous membranes are moist.  Eyes:     Pupils: Pupils are equal, round, and reactive to light.  Neck:     Vascular: No carotid bruit.  Cardiovascular:     Rate and Rhythm: Normal rate and regular rhythm.     Pulses: Normal pulses.     Heart sounds: Normal heart sounds.  Pulmonary:     Effort: Pulmonary effort is normal.     Breath sounds: Normal breath sounds.  Abdominal:     General: Bowel sounds are normal.     Palpations: Abdomen is soft. There is no hepatomegaly, splenomegaly or mass.     Tenderness: There is no abdominal tenderness.     Hernia: No hernia is present.  Musculoskeletal:     Cervical back: Neck supple.     Right lower leg: No edema.     Left lower leg: No edema.  Skin:    Findings: No rash.  Neurological:     Mental Status: He is alert and oriented to person, place, and time.     Motor: No weakness.  Psychiatric:        Mood and Affect: Mood normal.        Behavior: Behavior normal.     BP 130/80   Pulse 84   Ht 6' (1.829 m)   Wt 185 lb 14.4 oz (84.3 kg)   BMI 25.21 kg/m  Wt Readings from Last 3 Encounters:  08/05/21 185 lb 14.4 oz (84.3 kg)  07/01/21 180 lb (81.6 kg)  06/03/21 172 lb 8 oz (78.2 kg)     Health Maintenance Due  Topic Date Due   Zoster Vaccines- Shingrix (1 of 2) Never done   Pneumonia Vaccine 54+ Years old (1 - PCV) Never done   COVID-19 Vaccine (4 - Pfizer series) 01/16/2020    There are no preventive care reminders to display for this patient.  Lab Results  Component Value Date   TSH 0.72 12/05/2020   Lab Results  Component Value Date   WBC 6.0 12/05/2020   HGB 15.5 12/05/2020   HCT 47.0 12/05/2020   MCV 90.6 12/05/2020   PLT 138 (L) 12/05/2020   Lab Results  Component Value Date   NA 143 12/05/2020   K 4.2 12/05/2020   CO2 19 (L) 12/05/2020   GLUCOSE 100 (H) 12/05/2020   BUN 27 (H) 12/05/2020   CREATININE 1.49 (H) 12/05/2020   BILITOT 1.8 (H) 12/05/2020   ALKPHOS 52 10/08/2014   AST 34 12/05/2020   ALT  31 12/05/2020   PROT 6.2 12/05/2020   ALBUMIN 3.5 10/08/2014   CALCIUM 9.1 12/05/2020   ANIONGAP 6 12/23/2017   EGFR  43 (L) 12/05/2020   Lab Results  Component Value Date   CHOL 131 12/05/2020   Lab Results  Component Value Date   HDL 41 12/05/2020   Lab Results  Component Value Date   LDLCALC 71 12/05/2020   Lab Results  Component Value Date   TRIG 103 12/05/2020   Lab Results  Component Value Date   CHOLHDL 3.2 12/05/2020   No results found for: "HGBA1C"    Assessment & Plan:   Problem List Items Addressed This Visit       Cardiovascular and Mediastinum   Atrial fibrillation ([place]) - Primary    Pro time is therapeutic      Relevant Orders   POCT INR (Completed)   Sick sinus syndrome ([place])    Patient denies any syncope or tachycardia chest is clear heart is ir regular pro time is therapeutic      Essential hypertension     Patient denies any chest pain or shortness of breath there is no history of palpitation or paroxysmal nocturnal dyspnea   patient was advised to follow low-salt low-cholesterol diet    ideally I want to keep systolic blood pressure below 130 mmHg, patient was asked to check blood pressure one times a week and give me a report on that.  Patient will be follow-up in 3 months  or earlier as needed, patient will call me back for any change in the cardiovascular symptoms Patient was advised to buy a book from local bookstore concerning blood pressure and read several chapters  every day.  This will be supplemented by some of the material we will give him from the office.  Patient should also utilize other resources like YouTube and Internet to learn more about the blood pressure and the diet.        Endocrine   Hypothyroidism    Stable at the present time        Other   Hyperlipidemia    Patient is trying to follow low-cholesterol diet       No orders of the defined types were placed in this encounter.   Follow-up: No follow-ups on  file.    Cletis Athens, MD

## 2021-08-05 NOTE — Assessment & Plan Note (Signed)
Stable at the present time. 

## 2021-08-05 NOTE — Assessment & Plan Note (Signed)

## 2021-08-05 NOTE — Assessment & Plan Note (Signed)
Patient is trying to follow low-cholesterol diet

## 2021-08-05 NOTE — Assessment & Plan Note (Signed)
Patient denies any syncope or tachycardia chest is clear heart is ir regular pro time is therapeutic

## 2021-09-02 ENCOUNTER — Ambulatory Visit: Payer: Medicare PPO | Admitting: Internal Medicine

## 2021-09-02 VITALS — BP 131/89 | HR 90

## 2021-09-02 DIAGNOSIS — I4811 Longstanding persistent atrial fibrillation: Secondary | ICD-10-CM | POA: Diagnosis not present

## 2021-09-02 DIAGNOSIS — Z950 Presence of cardiac pacemaker: Secondary | ICD-10-CM

## 2021-09-02 DIAGNOSIS — I10 Essential (primary) hypertension: Secondary | ICD-10-CM | POA: Diagnosis not present

## 2021-09-06 NOTE — Progress Notes (Signed)
Established Patient Office Visit  Subjective:  Patient ID: Johnny Norman, male    DOB: [birth year]  Age: 88 y.o. MRN: [id number]  CC:  Chief Complaint  Patient presents with   Pacemaker Check    HPI  Johnny Norman presents for pacemaker check  Past Medical History:  Diagnosis Date   A-fib [facility])    CHF (congestive heart failure) ([place])    Claudication ([place])    leg   Hyperlipidemia    Peripheral vascular disease ([place])    Presence of permanent cardiac pacemaker    Small bowel obstruction ([place])    Thyroid disease     Past Surgical History:  Procedure Laterality Date   APPENDECTOMY     BOWEL RESECTION  10/12/2014   Procedure: SMALL BOWEL RESECTION;  Surgeon: Hubbard Robinson, MD;  Location: [facility] ORS;  Service: General;;   CHOLECYSTECTOMY     COLONOSCOPY     INSERT / REPLACE / REMOVE PACEMAKER  09/13/2008   SSS s/p pacemaker implant; Medtronic pacemaker,.   LAPAROTOMY N/A 10/12/2014   Procedure: EXPLORATORY LAPAROTOMY;  Surgeon: Hubbard Robinson, MD;  Location: [facility] ORS;  Service: General;  Laterality: N/A;   PACEMAKER INSERTION N/A 12/30/2017   Procedure: GENERATOR CHANGE OUT- DUAL CHAMBER;  Surgeon: Cletis Athens, MD;  Location: [facility] ORS;  Service: Cardiovascular;  Laterality: N/A;   PROSTATE SURGERY      Family History  Family history unknown: Yes    Social History   Socioeconomic History   Marital status: Widowed    Spouse name: Not on file   Number of children: Not on file   Years of education: Not on file   Highest education level: Not on file  Occupational History   Not on file  Tobacco Use   Smoking status: Never   Smokeless tobacco: Never  Vaping Use   Vaping Use: Never used  Substance and Sexual Activity   Alcohol use: No    Comment: occasional   Drug use: No   Sexual activity: Yes    Birth control/protection: None  Other Topics Concern   Not on file  Social History Narrative   Not on file   Social Determinants of Health    Financial Resource Strain: Not on file  Food Insecurity: Not on file  Transportation Needs: Not on file  Physical Activity: Not on file  Stress: Not on file  Social Connections: Not on file  Intimate Partner Violence: Not on file     Current Outpatient Medications:    atorvastatin (LIPITOR) 10 MG tablet, TAKE ONE TABLET BY MOUTH EVERY DAY, Disp: 90 tablet, Rfl: 3   Cholecalciferol (VITAMIN D) 50 MCG (2000 UT) CAPS, Take 2,000 Units by mouth daily. , Disp: , Rfl:    furosemide (LASIX) 20 MG tablet, TAKE ONE TABLET (20 MG) BY MOUTH EVERY DAY, Disp: 30 tablet, Rfl: 3   levothyroxine (SYNTHROID) 88 MCG tablet, TAKE 1 TABLET EVERY DAY ON EMPTY STOMACHWITH A GLASS OF WATER AT LEAST 30-60 MINBEFORE BREAKFAST, Disp: 90 tablet, Rfl: 3   lisinopril (ZESTRIL) 2.5 MG tablet, TAKE 1 TABLET BY MOUTH DAILY, Disp: 30 tablet, Rfl: 6   Multiple Vitamin (MULTIVITAMIN) tablet, Take 1 tablet by mouth daily., Disp: , Rfl:    nadolol (CORGARD) 20 MG tablet, TAKE 1 TABLET BY MOUTH DAILY, Disp: 90 tablet, Rfl: 3   warfarin (COUMADIN) 3 MG tablet, TAKE 1 TABLET BY MOUTH DAILY, Disp: 30 tablet, Rfl: 6   warfarin (COUMADIN) 5 MG tablet, Take  1 tablet (5 mg total) by mouth daily., Disp: 30 tablet, Rfl: 3   No Known Allergies  ROS Review of Systems  Constitutional: Negative.   HENT: Negative.    Eyes: Negative.   Respiratory: Negative.    Cardiovascular: Negative.   Gastrointestinal: Negative.   Endocrine: Negative.   Genitourinary: Negative.   Musculoskeletal: Negative.   Skin: Negative.   Allergic/Immunologic: Negative.   Neurological: Negative.   Hematological: Negative.   Psychiatric/Behavioral: Negative.    All other systems reviewed and are negative.     Objective:    Physical Exam Vitals reviewed.  Constitutional:      Appearance: Normal appearance.  HENT:     Mouth/Throat:     Mouth: Mucous membranes are moist.  Eyes:     Pupils: Pupils are equal, round, and reactive to light.   Neck:     Vascular: No carotid bruit.  Cardiovascular:     Rate and Rhythm: Normal rate and regular rhythm.     Pulses: Normal pulses.     Heart sounds: Normal heart sounds.  Pulmonary:     Effort: Pulmonary effort is normal.     Breath sounds: Normal breath sounds.  Abdominal:     General: Bowel sounds are normal.     Palpations: Abdomen is soft. There is no hepatomegaly, splenomegaly or mass.     Tenderness: There is no abdominal tenderness.     Hernia: No hernia is present.  Musculoskeletal:     Cervical back: Neck supple.     Right lower leg: No edema.     Left lower leg: No edema.  Skin:    Findings: No rash.  Neurological:     Mental Status: He is alert and oriented to person, place, and time.     Motor: No weakness.  Psychiatric:        Mood and Affect: Mood normal.        Behavior: Behavior normal.     BP 131/89   Pulse 90  Wt Readings from Last 3 Encounters:  08/05/21 185 lb 14.4 oz (84.3 kg)  07/01/21 180 lb (81.6 kg)  06/03/21 172 lb 8 oz (78.2 kg)     Health Maintenance Due  Topic Date Due   Zoster Vaccines- Shingrix (1 of 2) Never done   Pneumonia Vaccine 53+ Years old (1 - PCV) Never done   COVID-19 Vaccine (4 - Pfizer series) 01/16/2020   INFLUENZA VACCINE  08/27/2021    There are no preventive care reminders to display for this patient.  Lab Results  Component Value Date   TSH 0.72 12/05/2020   Lab Results  Component Value Date   WBC 6.0 12/05/2020   HGB 15.5 12/05/2020   HCT 47.0 12/05/2020   MCV 90.6 12/05/2020   PLT 138 (L) 12/05/2020   Lab Results  Component Value Date   NA 143 12/05/2020   K 4.2 12/05/2020   CO2 19 (L) 12/05/2020   GLUCOSE 100 (H) 12/05/2020   BUN 27 (H) 12/05/2020   CREATININE 1.49 (H) 12/05/2020   BILITOT 1.8 (H) 12/05/2020   ALKPHOS 52 10/08/2014   AST 34 12/05/2020   ALT 31 12/05/2020   PROT 6.2 12/05/2020   ALBUMIN 3.5 10/08/2014   CALCIUM 9.1 12/05/2020   ANIONGAP 6 12/23/2017   EGFR 43 (L)  12/05/2020   Lab Results  Component Value Date   CHOL 131 12/05/2020   Lab Results  Component Value Date   HDL 41 12/05/2020   Lab  Results  Component Value Date   LDLCALC 71 12/05/2020   Lab Results  Component Value Date   TRIG 103 12/05/2020   Lab Results  Component Value Date   CHOLHDL 3.2 12/05/2020   No results found for: "HGBA1C"    Assessment & Plan:   Problem List Items Addressed This Visit       Cardiovascular and Mediastinum   Atrial fibrillation ([place])    Stable on: Coumadin      Essential hypertension    The following hypertensive lifestyle modification were recommended and discussed:  1. Limiting alcohol intake to less than 1 oz/day of ethanol:(24 oz of beer or 8 oz of wine or 2 oz of 100-proof whiskey). 2. Take baby ASA 81 mg daily. 3. Importance of regular aerobic exercise and losing weight. 4. Reduce dietary saturated fat and cholesterol intake for overall cardiovascular health. 5. Maintaining adequate dietary potassium, calcium, and magnesium intake. 6. Regular monitoring of the blood pressure. 7. Reduce sodium intake to less than 100 mmol/day (less than 2.3 gm of sodium or less than 6 gm of sodium choride)         Other   Presence of permanent cardiac pacemaker    Note: Medical Device Follow-up  Patient pacemaker was interrogated by pacemakers analyzer, battery status is okay.  No programming changes were indicated after the review of the data.  Histogram shows no change since the last interrogation Atrial and ventricular sensing thresholds were found to be acceptable Impedance was checked and it was found to be normal.  Thresholds were found to be okay on evaluation of rhythm problem.  No high rate or low rate arrhythmia were noted.  Estimated battery longevity is 9.85yr I have personally reviewed the device data and amended the report as necessary.       Other Visit Diagnoses     Cardiac pacemaker in situ    -  Primary   Relevant Orders    PACEMAKER IN CLINIC CHECK       No orders of the defined types were placed in this encounter.   Follow-up: No follow-ups on file.    JCletis Athens MD

## 2021-09-06 NOTE — Assessment & Plan Note (Signed)

## 2021-09-06 NOTE — Assessment & Plan Note (Signed)
Note: Medical Device Follow-up  Patient pacemaker was interrogated by pacemakers analyzer, battery status is okay.  No programming changes were indicated after the review of the data.  Histogram shows no change since the last interrogation Atrial and ventricular sensing thresholds were found to be acceptable Impedance was checked and it was found to be normal.  Thresholds were found to be okay on evaluation of rhythm problem.  No high rate or low rate arrhythmia were noted.  Estimated battery longevity is 9.48yr. I have personally reviewed the device data and amended the report as necessary.

## 2021-09-06 NOTE — Assessment & Plan Note (Signed)
Stable on Coumadin.

## 2021-09-10 ENCOUNTER — Ambulatory Visit: Payer: Medicare PPO | Admitting: Internal Medicine

## 2021-09-10 ENCOUNTER — Encounter: Payer: Self-pay | Admitting: Internal Medicine

## 2021-09-10 VITALS — BP 137/92 | HR 73 | Ht 72.0 in | Wt 185.0 lb

## 2021-09-10 DIAGNOSIS — R2681 Unsteadiness on feet: Secondary | ICD-10-CM | POA: Diagnosis not present

## 2021-09-10 DIAGNOSIS — I4811 Longstanding persistent atrial fibrillation: Secondary | ICD-10-CM | POA: Diagnosis not present

## 2021-09-10 DIAGNOSIS — Z9181 History of falling: Secondary | ICD-10-CM

## 2021-09-10 DIAGNOSIS — R002 Palpitations: Secondary | ICD-10-CM | POA: Diagnosis not present

## 2021-09-10 NOTE — Assessment & Plan Note (Signed)
Electrocardiogram shows pacemaker rhythm

## 2021-09-10 NOTE — Progress Notes (Signed)
Established Patient Office Visit  Subjective:  Patient ID: Johnny Norman, male    DOB: 1926-01-01  Age: 88 y.o. MRN: [id number]  CC:  Chief Complaint  Patient presents with   Fall    Patient reports he fell at his home 2 days ago    Fall Pertinent negatives include no headaches.    [name] presents for dizziness.  He also scratched his left elbow and there is a bleeding from the lab.  No evidence of fracture or swelling.  Past Medical History:  Diagnosis Date   A-fib [facility])    CHF (congestive heart failure) ([facility])    Claudication ([facility])    leg   Hyperlipidemia    Peripheral vascular disease ([facility])    Presence of permanent cardiac pacemaker    Small bowel obstruction ([facility])    Thyroid disease     Past Surgical History:  Procedure Laterality Date   APPENDECTOMY     BOWEL RESECTION  10/12/2014   Procedure: SMALL BOWEL RESECTION;  Surgeon: Hubbard Robinson, MD;  Location: [facility] ORS;  Service: General;;   CHOLECYSTECTOMY     COLONOSCOPY     INSERT / REPLACE / REMOVE PACEMAKER  09/13/2008   SSS s/p pacemaker implant; Medtronic pacemaker,.   LAPAROTOMY N/A 10/12/2014   Procedure: EXPLORATORY LAPAROTOMY;  Surgeon: Hubbard Robinson, MD;  Location: [facility] ORS;  Service: General;  Laterality: N/A;   PACEMAKER INSERTION N/A 12/30/2017   Procedure: GENERATOR CHANGE OUT- DUAL CHAMBER;  Surgeon: Cletis Athens, MD;  Location: [facility] ORS;  Service: Cardiovascular;  Laterality: N/A;   PROSTATE SURGERY      Family History  Family history unknown: Yes    Social History   Socioeconomic History   Marital status: Widowed    Spouse name: Not on file   Number of children: Not on file   Years of education: Not on file   Highest education level: Not on file  Occupational History   Not on file  Tobacco Use   Smoking status: Never   Smokeless tobacco: Never  Vaping Use   Vaping Use: Never used  Substance and Sexual Activity   Alcohol use: No    Comment:  occasional   Drug use: No   Sexual activity: Yes    Birth control/protection: None  Other Topics Concern   Not on file  Social History Narrative   Not on file   Social Determinants of Health   Financial Resource Strain: Not on file  Food Insecurity: Not on file  Transportation Needs: Not on file  Physical Activity: Not on file  Stress: Not on file  Social Connections: Not on file  Intimate Partner Violence: Not on file     Current Outpatient Medications:    atorvastatin (LIPITOR) 10 MG tablet, TAKE ONE TABLET BY MOUTH EVERY DAY, Disp: 90 tablet, Rfl: 3   Cholecalciferol (VITAMIN D) 50 MCG (2000 UT) CAPS, Take 2,000 Units by mouth daily. , Disp: , Rfl:    furosemide (LASIX) 20 MG tablet, TAKE ONE TABLET (20 MG) BY MOUTH EVERY DAY, Disp: 30 tablet, Rfl: 3   levothyroxine (SYNTHROID) 88 MCG tablet, TAKE 1 TABLET EVERY DAY ON EMPTY STOMACHWITH A GLASS OF WATER AT LEAST 30-60 MINBEFORE BREAKFAST, Disp: 90 tablet, Rfl: 3   lisinopril (ZESTRIL) 2.5 MG tablet, TAKE 1 TABLET BY MOUTH DAILY, Disp: 30 tablet, Rfl: 6   Multiple Vitamin (MULTIVITAMIN) tablet, Take 1 tablet by mouth daily., Disp: , Rfl:    nadolol (CORGARD)  20 MG tablet, TAKE 1 TABLET BY MOUTH DAILY, Disp: 90 tablet, Rfl: 3   warfarin (COUMADIN) 3 MG tablet, TAKE 1 TABLET BY MOUTH DAILY, Disp: 30 tablet, Rfl: 6   warfarin (COUMADIN) 5 MG tablet, Take 1 tablet (5 mg total) by mouth daily., Disp: 30 tablet, Rfl: 3   No Known Allergies  ROS Review of Systems  Constitutional: Negative.   HENT: Negative.    Eyes: Negative.   Respiratory: Negative.    Cardiovascular: Negative.   Gastrointestinal: Negative.   Endocrine: Negative.   Genitourinary: Negative.   Musculoskeletal: Negative.   Skin: Negative.   Allergic/Immunologic: Negative.   Neurological:  Positive for dizziness, weakness and light-headedness. Negative for tremors, seizures, facial asymmetry, speech difficulty and headaches.  Hematological: Negative.    Psychiatric/Behavioral: Negative.    All other systems reviewed and are negative.     Objective:    Physical Exam Vitals reviewed.  Constitutional:      Appearance: Normal appearance.  HENT:     Mouth/Throat:     Mouth: Mucous membranes are moist.  Eyes:     Pupils: Pupils are equal, round, and reactive to light.  Neck:     Vascular: No carotid bruit.  Cardiovascular:     Rate and Rhythm: Normal rate and regular rhythm.     Pulses: Normal pulses.     Heart sounds: Normal heart sounds.  Pulmonary:     Effort: Pulmonary effort is normal.     Breath sounds: Normal breath sounds.  Abdominal:     General: Bowel sounds are normal.     Palpations: Abdomen is soft. There is no hepatomegaly, splenomegaly or mass.     Tenderness: There is no abdominal tenderness.     Hernia: No hernia is present.  Musculoskeletal:     Cervical back: Neck supple.     Right lower leg: No edema.     Left lower leg: No edema.  Skin:    Findings: No rash.  Neurological:     Mental Status: He is alert and oriented to person, place, and time.     Motor: No weakness.     Comments: #1 Romberg test is positive #2 patient has an unsteady gait, he was advised to use walker.  Also advised to use lifeline.  Psychiatric:        Mood and Affect: Mood normal.        Behavior: Behavior normal.     BP (!) 137/92   Pulse 73   Ht 6' (1.829 m)   Wt 185 lb (83.9 kg)   BMI 25.09 kg/m  Wt Readings from Last 3 Encounters:  09/10/21 185 lb (83.9 kg)  08/05/21 185 lb 14.4 oz (84.3 kg)  07/01/21 180 lb (81.6 kg)     Health Maintenance Due  Topic Date Due   Zoster Vaccines- Shingrix (1 of 2) Never done   Pneumonia Vaccine 54+ Years old (1 - PCV) Never done   COVID-19 Vaccine (4 - Pfizer series) 01/16/2020   INFLUENZA VACCINE  08/27/2021    There are no preventive care reminders to display for this patient.  Lab Results  Component Value Date   TSH 0.72 12/05/2020   Lab Results  Component Value Date    WBC 6.0 12/05/2020   HGB 15.5 12/05/2020   HCT 47.0 12/05/2020   MCV 90.6 12/05/2020   PLT 138 (L) 12/05/2020   Lab Results  Component Value Date   NA 143 12/05/2020   K 4.2 12/05/2020  CO2 19 (L) 12/05/2020   GLUCOSE 100 (H) 12/05/2020   BUN 27 (H) 12/05/2020   CREATININE 1.49 (H) 12/05/2020   BILITOT 1.8 (H) 12/05/2020   ALKPHOS 52 10/08/2014   AST 34 12/05/2020   ALT 31 12/05/2020   PROT 6.2 12/05/2020   ALBUMIN 3.5 10/08/2014   CALCIUM 9.1 12/05/2020   ANIONGAP 6 12/23/2017   EGFR 43 (L) 12/05/2020   Lab Results  Component Value Date   CHOL 131 12/05/2020   Lab Results  Component Value Date   HDL 41 12/05/2020   Lab Results  Component Value Date   LDLCALC 71 12/05/2020   Lab Results  Component Value Date   TRIG 103 12/05/2020   Lab Results  Component Value Date   CHOLHDL 3.2 12/05/2020   No results found for: "HGBA1C"    Assessment & Plan:   Problem List Items Addressed This Visit       Cardiovascular and Mediastinum   Atrial fibrillation [facility])    Electrocardiogram shows pacemaker rhythm        Other   Palpitations    Stable, patient is on Eliquis for atrial fibrillation      Unsteady gait    Patient has unsteady gait and has a history of fall at.  Home. Romberg sign is positive There is no focal neurological sign We will try to get her home health nurse Patient should not live alone       Other Visit Diagnoses     History of recent fall    -  Primary   Relevant Orders   EKG 12-Lead     Report of the  Pacemaker rhythm right bundle block No orders of the defined types were placed in this encounter.  Patient should not live alone.  I talked to the daughter and son together and also going to arrange for sitter at night if they cannot stay with him  but he refuses to go to their house.  Bathroom handles  keep the bathroom lights on at night and handle so he also he can get up from the commode.  Day and nighttime.   .  Patient  has a lifeline so he was advised to use it electrocardiogram shows pacemaker rhythm  Follow-up: No follow-ups on file.    Cletis Athens, MD

## 2021-09-10 NOTE — Assessment & Plan Note (Signed)
Stable, patient is on Eliquis for atrial fibrillation

## 2021-09-10 NOTE — Assessment & Plan Note (Signed)
Patient has unsteady gait and has a history of fall at.  Home. Romberg sign is positive There is no focal neurological sign We will try to get her home health nurse Patient should not live alone

## 2021-10-14 ENCOUNTER — Encounter: Payer: Self-pay | Admitting: Internal Medicine

## 2021-10-14 ENCOUNTER — Ambulatory Visit: Payer: Medicare PPO | Admitting: Internal Medicine

## 2021-10-14 VITALS — BP 112/81 | HR 72 | Ht 72.0 in | Wt 176.3 lb

## 2021-10-14 DIAGNOSIS — E063 Autoimmune thyroiditis: Secondary | ICD-10-CM

## 2021-10-14 DIAGNOSIS — I4811 Longstanding persistent atrial fibrillation: Secondary | ICD-10-CM

## 2021-10-14 DIAGNOSIS — E782 Mixed hyperlipidemia: Secondary | ICD-10-CM

## 2021-10-14 DIAGNOSIS — I10 Essential (primary) hypertension: Secondary | ICD-10-CM

## 2021-10-14 DIAGNOSIS — Z23 Encounter for immunization: Secondary | ICD-10-CM

## 2021-10-14 DIAGNOSIS — E038 Other specified hypothyroidism: Secondary | ICD-10-CM | POA: Diagnosis not present

## 2021-10-14 LAB — POCT INR: INR: 2 (ref 2.0–3.0)

## 2021-10-14 NOTE — Assessment & Plan Note (Signed)
Blood pressure stable no dizziness chest is clear heart is  irregular

## 2021-10-14 NOTE — Assessment & Plan Note (Signed)
Hypercholesterolemia  I advised the patient to follow Mediterranean diet This diet is rich in fruits vegetables and whole grain, and This diet is also rich in fish and lean meat Patient should also eat a handful of almonds or walnuts daily Recent heart study indicated that average follow-up on this kind of diet reduces the cardiovascular mortality by 50 to 70%== 

## 2021-10-14 NOTE — Addendum Note (Signed)
Addended by: Alois Cliche on: 10/14/2021 12:39 PM   Modules accepted: Orders

## 2021-10-14 NOTE — Progress Notes (Signed)
Established Patient Office Visit  Subjective:  Patient ID: Johnny Norman, male    DOB: 05/14/1925  Age: 88 y.o. MRN: [id number]  CC:  Chief Complaint  Patient presents with   Atrial Fibrillation    Atrial Fibrillation Past medical history includes atrial fibrillation.    Johnny Norman presents for check up  Past Medical History:  Diagnosis Date   A-fib [facility])    CHF (congestive heart failure) ([place])    Claudication ([place])    leg   Hyperlipidemia    Peripheral vascular disease ([place])    Presence of permanent cardiac pacemaker    Small bowel obstruction ([facility])    Thyroid disease     Past Surgical History:  Procedure Laterality Date   APPENDECTOMY     BOWEL RESECTION  10/12/2014   Procedure: SMALL BOWEL RESECTION;  Surgeon: Hubbard Robinson, MD;  Location: [facility] ORS;  Service: General;;   CHOLECYSTECTOMY     COLONOSCOPY     INSERT / REPLACE / REMOVE PACEMAKER  09/13/2008   SSS s/p pacemaker implant; Medtronic pacemaker,.   LAPAROTOMY N/A 10/12/2014   Procedure: EXPLORATORY LAPAROTOMY;  Surgeon: Hubbard Robinson, MD;  Location: [facility] ORS;  Service: General;  Laterality: N/A;   PACEMAKER INSERTION N/A 12/30/2017   Procedure: GENERATOR CHANGE OUT- DUAL CHAMBER;  Surgeon: Cletis Athens, MD;  Location: [facility] ORS;  Service: Cardiovascular;  Laterality: N/A;   PROSTATE SURGERY      Family History  Family history unknown: Yes    Social History   Socioeconomic History   Marital status: Widowed    Spouse name: Not on file   Number of children: Not on file   Years of education: Not on file   Highest education level: Not on file  Occupational History   Not on file  Tobacco Use   Smoking status: Never   Smokeless tobacco: Never  Vaping Use   Vaping Use: Never used  Substance and Sexual Activity   Alcohol use: No    Comment: occasional   Drug use: No   Sexual activity: Yes    Birth control/protection: None  Other Topics Concern   Not on file  Social  History Narrative   Not on file   Social Determinants of Health   Financial Resource Strain: Not on file  Food Insecurity: Not on file  Transportation Needs: Not on file  Physical Activity: Not on file  Stress: Not on file  Social Connections: Not on file  Intimate Partner Violence: Not on file     Current Outpatient Medications:    atorvastatin (LIPITOR) 10 MG tablet, TAKE ONE TABLET BY MOUTH EVERY DAY, Disp: 90 tablet, Rfl: 3   Cholecalciferol (VITAMIN D) 50 MCG (2000 UT) CAPS, Take 2,000 Units by mouth daily. , Disp: , Rfl:    furosemide (LASIX) 20 MG tablet, TAKE ONE TABLET (20 MG) BY MOUTH EVERY DAY, Disp: 30 tablet, Rfl: 3   levothyroxine (SYNTHROID) 88 MCG tablet, TAKE 1 TABLET EVERY DAY ON EMPTY STOMACHWITH A GLASS OF WATER AT LEAST 30-60 MINBEFORE BREAKFAST, Disp: 90 tablet, Rfl: 3   lisinopril (ZESTRIL) 2.5 MG tablet, TAKE 1 TABLET BY MOUTH DAILY, Disp: 30 tablet, Rfl: 6   Multiple Vitamin (MULTIVITAMIN) tablet, Take 1 tablet by mouth daily., Disp: , Rfl:    nadolol (CORGARD) 20 MG tablet, TAKE 1 TABLET BY MOUTH DAILY, Disp: 90 tablet, Rfl: 3   warfarin (COUMADIN) 3 MG tablet, TAKE 1 TABLET BY MOUTH DAILY, Disp: 30 tablet, Rfl:  6   warfarin (COUMADIN) 5 MG tablet, Take 1 tablet (5 mg total) by mouth daily., Disp: 30 tablet, Rfl: 3   No Known Allergies  ROS Review of Systems  Constitutional: Negative.   HENT: Negative.    Eyes: Negative.   Respiratory: Negative.    Cardiovascular: Negative.   Gastrointestinal: Negative.   Endocrine: Negative.   Genitourinary: Negative.   Musculoskeletal: Negative.   Skin: Negative.   Allergic/Immunologic: Negative.   Neurological: Negative.   Hematological: Negative.   Psychiatric/Behavioral: Negative.    All other systems reviewed and are negative.     Objective:    Physical Exam Vitals reviewed.  Constitutional:      Appearance: Normal appearance.  HENT:     Mouth/Throat:     Mouth: Mucous membranes are moist.   Eyes:     Pupils: Pupils are equal, round, and reactive to light.  Neck:     Vascular: No carotid bruit.  Cardiovascular:     Rate and Rhythm: Normal rate and regular rhythm.     Pulses: Normal pulses.     Heart sounds: Normal heart sounds.  Pulmonary:     Effort: Pulmonary effort is normal.     Breath sounds: Normal breath sounds.  Abdominal:     General: Bowel sounds are normal.     Palpations: Abdomen is soft. There is no hepatomegaly, splenomegaly or mass.     Tenderness: There is no abdominal tenderness.     Hernia: No hernia is present.  Musculoskeletal:     Cervical back: Neck supple.     Right lower leg: No edema.     Left lower leg: No edema.  Skin:    Findings: No rash.  Neurological:     Mental Status: He is alert and oriented to person, place, and time.     Motor: No weakness.  Psychiatric:        Mood and Affect: Mood normal.        Behavior: Behavior normal.     BP 112/81   Pulse 72   Ht 6' (1.829 m)   Wt 176 lb 4.8 oz (80 kg)   BMI 23.91 kg/m  Wt Readings from Last 3 Encounters:  10/14/21 176 lb 4.8 oz (80 kg)  09/10/21 185 lb (83.9 kg)  08/05/21 185 lb 14.4 oz (84.3 kg)     Health Maintenance Due  Topic Date Due   Zoster Vaccines- Shingrix (1 of 2) Never done   Pneumonia Vaccine 64+ Years old (1 - PCV) Never done   INFLUENZA VACCINE  08/27/2021    There are no preventive care reminders to display for this patient.  Lab Results  Component Value Date   TSH 0.72 12/05/2020   Lab Results  Component Value Date   WBC 6.0 12/05/2020   HGB 15.5 12/05/2020   HCT 47.0 12/05/2020   MCV 90.6 12/05/2020   PLT 138 (L) 12/05/2020   Lab Results  Component Value Date   NA 143 12/05/2020   K 4.2 12/05/2020   CO2 19 (L) 12/05/2020   GLUCOSE 100 (H) 12/05/2020   BUN 27 (H) 12/05/2020   CREATININE 1.49 (H) 12/05/2020   BILITOT 1.8 (H) 12/05/2020   ALKPHOS 52 10/08/2014   AST 34 12/05/2020   ALT 31 12/05/2020   PROT 6.2 12/05/2020   ALBUMIN  3.5 10/08/2014   CALCIUM 9.1 12/05/2020   ANIONGAP 6 12/23/2017   EGFR 43 (L) 12/05/2020   Lab Results  Component Value Date  CHOL 131 12/05/2020   Lab Results  Component Value Date   HDL 41 12/05/2020   Lab Results  Component Value Date   LDLCALC 71 12/05/2020   Lab Results  Component Value Date   TRIG 103 12/05/2020   Lab Results  Component Value Date   CHOLHDL 3.2 12/05/2020   No results found for: "HGBA1C"    Assessment & Plan:   Problem List Items Addressed This Visit       Cardiovascular and Mediastinum   Atrial fibrillation ([place]) - Primary    Stable at the present time      Relevant Orders   POCT INR (Completed)   Essential hypertension    Blood pressure stable no dizziness chest is clear heart is  irregular        Endocrine   Hypothyroidism    Continue present medication        Other   Hyperlipidemia    Hypercholesterolemia  I advised the patient to follow Mediterranean diet This diet is rich in fruits vegetables and whole grain, and This diet is also rich in fish and lean meat Patient should also eat a handful of almonds or walnuts daily Recent heart study indicated that average follow-up on this kind of diet reduces the cardiovascular mortality by 50 to 70%==       No orders of the defined types were placed in this encounter.   Follow-up: No follow-ups on file.    Cletis Athens, MD

## 2021-10-14 NOTE — Assessment & Plan Note (Signed)
Continue present medication 

## 2021-10-14 NOTE — Assessment & Plan Note (Signed)
Stable at the present time. 

## 2021-10-16 ENCOUNTER — Other Ambulatory Visit: Payer: Self-pay | Admitting: Internal Medicine

## 2021-10-16 DIAGNOSIS — I4891 Unspecified atrial fibrillation: Secondary | ICD-10-CM

## 2021-10-17 ENCOUNTER — Other Ambulatory Visit: Payer: Self-pay | Admitting: Internal Medicine

## 2021-10-17 DIAGNOSIS — I4891 Unspecified atrial fibrillation: Secondary | ICD-10-CM

## 2021-10-21 ENCOUNTER — Other Ambulatory Visit: Payer: Self-pay | Admitting: Internal Medicine

## 2021-11-18 ENCOUNTER — Ambulatory Visit: Payer: Medicare PPO | Admitting: Internal Medicine

## 2021-11-18 ENCOUNTER — Encounter: Payer: Self-pay | Admitting: Internal Medicine

## 2021-11-18 VITALS — BP 140/90 | HR 72 | Ht 72.0 in | Wt 185.3 lb

## 2021-11-18 DIAGNOSIS — I4811 Longstanding persistent atrial fibrillation: Secondary | ICD-10-CM | POA: Diagnosis not present

## 2021-11-18 DIAGNOSIS — R351 Nocturia: Secondary | ICD-10-CM | POA: Diagnosis not present

## 2021-11-18 DIAGNOSIS — E782 Mixed hyperlipidemia: Secondary | ICD-10-CM

## 2021-11-18 DIAGNOSIS — N3942 Incontinence without sensory awareness: Secondary | ICD-10-CM

## 2021-11-18 DIAGNOSIS — R972 Elevated prostate specific antigen [PSA]: Secondary | ICD-10-CM

## 2021-11-18 DIAGNOSIS — E038 Other specified hypothyroidism: Secondary | ICD-10-CM

## 2021-11-18 DIAGNOSIS — I739 Peripheral vascular disease, unspecified: Secondary | ICD-10-CM | POA: Diagnosis not present

## 2021-11-18 DIAGNOSIS — E063 Autoimmune thyroiditis: Secondary | ICD-10-CM | POA: Diagnosis not present

## 2021-11-18 LAB — POCT INR: INR: 2 (ref 2.0–3.0)

## 2021-11-18 MED ORDER — TOLTERODINE TARTRATE 2 MG PO TABS: 2.0000 mg | ORAL_TABLET | Freq: Two times a day (BID) | ORAL | 4 refills | Status: DC

## 2021-11-18 NOTE — Assessment & Plan Note (Signed)
Atrial fibrillation is stable 

## 2021-11-18 NOTE — Progress Notes (Signed)
Established Patient Office Visit  Subjective:  Patient ID: Johnny Norman, male    DOB: 10-Mar-1925  Age: 88 y.o. MRN: [id number]  CC:  Chief Complaint  Patient presents with   Atrial Fibrillation    Atrial Fibrillation Presents for follow-up visit. Symptoms are negative for hypotension, pacemaker problem, palpitations, syncope, tachycardia and weakness. The symptoms have been stable. Past medical history includes atrial fibrillation. There are no medication compliance problems.    Johnny Norman presents for pro time and bp check  Past Medical History:  Diagnosis Date   A-fib [facility])    CHF (congestive heart failure) (HCC)    Claudication (HCC)    leg   Hyperlipidemia    Peripheral vascular disease ([place])    Presence of permanent cardiac pacemaker    Small bowel obstruction (HCC)    Thyroid disease     Past Surgical History:  Procedure Laterality Date   APPENDECTOMY     BOWEL RESECTION  10/12/2014   Procedure: SMALL BOWEL RESECTION;  Surgeon: Hubbard Robinson, MD;  Location: [facility] ORS;  Service: General;;   CHOLECYSTECTOMY     COLONOSCOPY     INSERT / REPLACE / REMOVE PACEMAKER  09/13/2008   SSS s/p pacemaker implant; Medtronic pacemaker,.   LAPAROTOMY N/A 10/12/2014   Procedure: EXPLORATORY LAPAROTOMY;  Surgeon: Hubbard Robinson, MD;  Location: [facility] ORS;  Service: General;  Laterality: N/A;   PACEMAKER INSERTION N/A 12/30/2017   Procedure: GENERATOR CHANGE OUT- DUAL CHAMBER;  Surgeon: Cletis Athens, MD;  Location: [facility] ORS;  Service: Cardiovascular;  Laterality: N/A;   PROSTATE SURGERY      Family History  Family history unknown: Yes    Social History   Socioeconomic History   Marital status: Widowed    Spouse name: Not on file   Number of children: Not on file   Years of education: Not on file   Highest education level: Not on file  Occupational History   Not on file  Tobacco Use   Smoking status: Never   Smokeless tobacco: Never  Vaping  Use   Vaping Use: Never used  Substance and Sexual Activity   Alcohol use: No    Comment: occasional   Drug use: No   Sexual activity: Yes    Birth control/protection: None  Other Topics Concern   Not on file  Social History Narrative   Not on file   Social Determinants of Health   Financial Resource Strain: Not on file  Food Insecurity: Not on file  Transportation Needs: Not on file  Physical Activity: Not on file  Stress: Not on file  Social Connections: Not on file  Intimate Partner Violence: Not on file     Current Outpatient Medications:    atorvastatin (LIPITOR) 10 MG tablet, TAKE ONE TABLET BY MOUTH EVERY DAY, Disp: 90 tablet, Rfl: 3   Cholecalciferol (VITAMIN D) 50 MCG (2000 UT) CAPS, Take 2,000 Units by mouth daily. , Disp: , Rfl:    furosemide (LASIX) 20 MG tablet, TAKE ONE TABLET (20 MG) BY MOUTH EVERY DAY, Disp: 30 tablet, Rfl: 3   levothyroxine (SYNTHROID) 88 MCG tablet, TAKE 1 TABLET EVERY DAY ON EMPTY STOMACHWITH A GLASS OF WATER AT LEAST 30-60 MINBEFORE BREAKFAST, Disp: 90 tablet, Rfl: 3   lisinopril (ZESTRIL) 2.5 MG tablet, TAKE 1 TABLET BY MOUTH DAILY, Disp: 30 tablet, Rfl: 6   Multiple Vitamin (MULTIVITAMIN) tablet, Take 1 tablet by mouth daily., Disp: , Rfl:    nadolol (CORGARD) 20  MG tablet, TAKE 1 TABLET BY MOUTH DAILY, Disp: 90 tablet, Rfl: 3   warfarin (COUMADIN) 3 MG tablet, TAKE 1 TABLET BY MOUTH DAILY, Disp: 30 tablet, Rfl: 6   warfarin (COUMADIN) 5 MG tablet, Take 1 tablet (5 mg total) by mouth daily., Disp: 30 tablet, Rfl: 3   No Known Allergies  ROS Review of Systems  Constitutional: Negative.   HENT: Negative.    Eyes: Negative.   Respiratory: Negative.    Cardiovascular: Negative.  Negative for palpitations and syncope.  Gastrointestinal: Negative.   Endocrine: Negative.   Genitourinary: Negative.   Musculoskeletal: Negative.   Skin: Negative.   Allergic/Immunologic: Negative.   Neurological: Negative.  Negative for weakness.   Hematological: Negative.   Psychiatric/Behavioral: Negative.    All other systems reviewed and are negative.     Objective:    Physical Exam Vitals reviewed.  Constitutional:      Appearance: Normal appearance. He is not ill-appearing.  HENT:     Mouth/Throat:     Mouth: Mucous membranes are moist.  Eyes:     Pupils: Pupils are equal, round, and reactive to light.  Neck:     Vascular: No carotid bruit.  Cardiovascular:     Rate and Rhythm: Normal rate and regular rhythm.     Pulses: Normal pulses.     Heart sounds: Normal heart sounds.  Pulmonary:     Effort: Pulmonary effort is normal.     Breath sounds: Normal breath sounds.  Abdominal:     General: Bowel sounds are normal.     Palpations: Abdomen is soft. There is no hepatomegaly, splenomegaly or mass.     Tenderness: There is no abdominal tenderness.     Hernia: No hernia is present.  Musculoskeletal:     Cervical back: Neck supple.     Right lower leg: No edema.     Left lower leg: No edema.  Skin:    Findings: No rash.  Neurological:     Mental Status: He is alert and oriented to person, place, and time.     Motor: No weakness.  Psychiatric:        Mood and Affect: Mood normal.        Behavior: Behavior normal.     BP (!) 140/90   Pulse 72   Ht 6' (1.829 m)   Wt 185 lb 4.8 oz (84.1 kg)   BMI 25.13 kg/m  Wt Readings from Last 3 Encounters:  11/18/21 185 lb 4.8 oz (84.1 kg)  10/14/21 176 lb 4.8 oz (80 kg)  09/10/21 185 lb (83.9 kg)     Health Maintenance Due  Topic Date Due   Zoster Vaccines- Shingrix (1 of 2) Never done   Pneumonia Vaccine 5+ Years old (1 - PCV) Never done    There are no preventive care reminders to display for this patient.  Lab Results  Component Value Date   TSH 0.72 12/05/2020   Lab Results  Component Value Date   WBC 6.0 12/05/2020   HGB 15.5 12/05/2020   HCT 47.0 12/05/2020   MCV 90.6 12/05/2020   PLT 138 (L) 12/05/2020   Lab Results  Component Value Date    NA 143 12/05/2020   K 4.2 12/05/2020   CO2 19 (L) 12/05/2020   GLUCOSE 100 (H) 12/05/2020   BUN 27 (H) 12/05/2020   CREATININE 1.49 (H) 12/05/2020   BILITOT 1.8 (H) 12/05/2020   ALKPHOS 52 10/08/2014   AST 34 12/05/2020   ALT  31 12/05/2020   PROT 6.2 12/05/2020   ALBUMIN 3.5 10/08/2014   CALCIUM 9.1 12/05/2020   ANIONGAP 6 12/23/2017   EGFR 43 (L) 12/05/2020   Lab Results  Component Value Date   CHOL 131 12/05/2020   Lab Results  Component Value Date   HDL 41 12/05/2020   Lab Results  Component Value Date   LDLCALC 71 12/05/2020   Lab Results  Component Value Date   TRIG 103 12/05/2020   Lab Results  Component Value Date   CHOLHDL 3.2 12/05/2020   No results found for: "HGBA1C"    Assessment & Plan:   Problem List Items Addressed This Visit       Cardiovascular and Mediastinum   Atrial fibrillation ([place]) - Primary    Atrial fibrillation is stable      Relevant Orders   POCT INR (Completed)     Endocrine   Hypothyroidism    We will check TSH        Other   Hyperlipidemia   Elevated prostate specific antigen (PSA)    Check PSA again      Claudication [facility])   Urinary incontinence without sensory awareness    We will start patient on detrol      Nocturia    We will check PSA and labs      Relevant Orders   CBC with Differential/Platelet   COMPLETE METABOLIC PANEL WITH GFR   PSA   Pro time is stable No orders of the defined types were placed in this encounter.   Follow-up: No follow-ups on file.    Cletis Athens, MD

## 2021-11-18 NOTE — Assessment & Plan Note (Signed)
We will start patient on detrol

## 2021-11-18 NOTE — Assessment & Plan Note (Signed)
Check PSA again

## 2021-11-18 NOTE — Assessment & Plan Note (Signed)
We will check TSH 

## 2021-11-18 NOTE — Assessment & Plan Note (Signed)
We will check PSA and labs

## 2021-11-19 LAB — COMPLETE METABOLIC PANEL WITH GFR
AG Ratio: 1.5 (calc) (ref 1.0–2.5)
ALT: 27 U/L (ref 9–46)
AST: 34 U/L (ref 10–35)
Albumin: 4.3 g/dL (ref 3.6–5.1)
Alkaline phosphatase (APISO): 144 U/L (ref 35–144)
BUN/Creatinine Ratio: 14 (calc) (ref 6–22)
BUN: 23 mg/dL (ref 7–25)
CO2: 23 mmol/L (ref 20–32)
Calcium: 9.3 mg/dL (ref 8.6–10.3)
Chloride: 104 mmol/L (ref 98–110)
Creat: 1.6 mg/dL — ABNORMAL HIGH (ref 0.70–1.22)
Globulin: 2.8 g/dL (calc) (ref 1.9–3.7)
Glucose, Bld: 76 mg/dL (ref 65–99)
Potassium: 4.6 mmol/L (ref 3.5–5.3)
Sodium: 141 mmol/L (ref 135–146)
Total Bilirubin: 2.4 mg/dL — ABNORMAL HIGH (ref 0.2–1.2)
Total Protein: 7.1 g/dL (ref 6.1–8.1)
eGFR: 40 mL/min/{1.73_m2} — ABNORMAL LOW (ref >=60)

## 2021-11-19 LAB — CBC WITH DIFFERENTIAL/PLATELET
Absolute Monocytes: 644 cells/uL (ref 200–950)
Basophils Absolute: 59 cells/uL (ref 0–200)
Basophils Relative: 0.8 %
Eosinophils Absolute: 170 cells/uL (ref 15–500)
Eosinophils Relative: 2.3 %
HCT: 45.8 % (ref 38.5–50.0)
Hemoglobin: 15.3 g/dL (ref 13.2–17.1)
Lymphs Abs: 1043 cells/uL (ref 850–3900)
MCH: 30.1 pg (ref 27.0–33.0)
MCHC: 33.4 g/dL (ref 32.0–36.0)
MCV: 90 fL (ref 80.0–100.0)
MPV: 10.6 fL (ref 7.5–12.5)
Monocytes Relative: 8.7 %
Neutro Abs: 5483 cells/uL (ref 1500–7800)
Neutrophils Relative %: 74.1 %
Platelets: 141 10*3/uL (ref 140–400)
RBC: 5.09 10*6/uL (ref 4.20–5.80)
RDW: 13.1 % (ref 11.0–15.0)
Total Lymphocyte: 14.1 %
WBC: 7.4 10*3/uL (ref 3.8–10.8)

## 2021-11-19 LAB — PSA: PSA: 1.33 ng/mL (ref <=4.00)

## 2021-11-25 ENCOUNTER — Encounter: Payer: Self-pay | Admitting: Internal Medicine

## 2021-11-25 ENCOUNTER — Other Ambulatory Visit: Payer: Self-pay | Admitting: *Deleted

## 2021-11-25 ENCOUNTER — Ambulatory Visit: Payer: Medicare PPO | Admitting: Internal Medicine

## 2021-11-25 VITALS — BP 131/86 | HR 82 | Ht 72.0 in | Wt 185.3 lb

## 2021-11-25 DIAGNOSIS — I495 Sick sinus syndrome: Secondary | ICD-10-CM

## 2021-11-25 DIAGNOSIS — I10 Essential (primary) hypertension: Secondary | ICD-10-CM

## 2021-11-25 DIAGNOSIS — R2681 Unsteadiness on feet: Secondary | ICD-10-CM

## 2021-11-25 DIAGNOSIS — E063 Autoimmune thyroiditis: Secondary | ICD-10-CM | POA: Diagnosis not present

## 2021-11-25 DIAGNOSIS — E038 Other specified hypothyroidism: Secondary | ICD-10-CM

## 2021-11-25 MED ORDER — BLOOD PRESSURE MONITOR KIT: 1.0000 | PACK | Freq: Every day | 0 refills | Status: AC

## 2021-11-25 NOTE — Assessment & Plan Note (Signed)
Patient was advised to walk with a stick 

## 2021-11-25 NOTE — Assessment & Plan Note (Signed)
Blood pressure is labile patient has orthostatic hypotension I checked his blood pressure standing it was 120/80

## 2021-11-25 NOTE — Progress Notes (Signed)
Established Patient Office Visit  Subjective:  Patient ID: Johnny Norman, male    DOB: 25-Feb-1925  Age: 88 y.o. MRN: [id number]  CC:  Chief Complaint  Patient presents with   lab results    HPI  Johnny Norman presents for check up  Past Medical History:  Diagnosis Date   A-fib [facility])    CHF (congestive heart failure) ([place])    Claudication ([place])    leg   Hyperlipidemia    Peripheral vascular disease ([place])    Presence of permanent cardiac pacemaker    Small bowel obstruction ([place])    Thyroid disease     Past Surgical History:  Procedure Laterality Date   APPENDECTOMY     BOWEL RESECTION  10/12/2014   Procedure: SMALL BOWEL RESECTION;  Surgeon: Hubbard Robinson, MD;  Location: [facility] ORS;  Service: General;;   CHOLECYSTECTOMY     COLONOSCOPY     INSERT / REPLACE / REMOVE PACEMAKER  09/13/2008   SSS s/p pacemaker implant; Medtronic pacemaker,.   LAPAROTOMY N/A 10/12/2014   Procedure: EXPLORATORY LAPAROTOMY;  Surgeon: Hubbard Robinson, MD;  Location: [facility] ORS;  Service: General;  Laterality: N/A;   PACEMAKER INSERTION N/A 12/30/2017   Procedure: GENERATOR CHANGE OUT- DUAL CHAMBER;  Surgeon: Cletis Athens, MD;  Location: [facility] ORS;  Service: Cardiovascular;  Laterality: N/A;   PROSTATE SURGERY      Family History  Family history unknown: Yes    Social History   Socioeconomic History   Marital status: Widowed    Spouse name: Not on file   Number of children: Not on file   Years of education: Not on file   Highest education level: Not on file  Occupational History   Not on file  Tobacco Use   Smoking status: Never   Smokeless tobacco: Never  Vaping Use   Vaping Use: Never used  Substance and Sexual Activity   Alcohol use: No    Comment: occasional   Drug use: No   Sexual activity: Yes    Birth control/protection: None  Other Topics Concern   Not on file  Social History Narrative   Not on file   Social Determinants of Health    Financial Resource Strain: Not on file  Food Insecurity: Not on file  Transportation Needs: Not on file  Physical Activity: Not on file  Stress: Not on file  Social Connections: Not on file  Intimate Partner Violence: Not on file     Current Outpatient Medications:    atorvastatin (LIPITOR) 10 MG tablet, TAKE ONE TABLET BY MOUTH EVERY DAY, Disp: 90 tablet, Rfl: 3   Cholecalciferol (VITAMIN D) 50 MCG (2000 UT) CAPS, Take 2,000 Units by mouth daily. , Disp: , Rfl:    furosemide (LASIX) 20 MG tablet, TAKE ONE TABLET (20 MG) BY MOUTH EVERY DAY, Disp: 30 tablet, Rfl: 3   levothyroxine (SYNTHROID) 88 MCG tablet, TAKE 1 TABLET EVERY DAY ON EMPTY STOMACHWITH A GLASS OF WATER AT LEAST 30-60 MINBEFORE BREAKFAST, Disp: 90 tablet, Rfl: 3   lisinopril (ZESTRIL) 2.5 MG tablet, TAKE 1 TABLET BY MOUTH DAILY, Disp: 30 tablet, Rfl: 6   Multiple Vitamin (MULTIVITAMIN) tablet, Take 1 tablet by mouth daily., Disp: , Rfl:    nadolol (CORGARD) 20 MG tablet, TAKE 1 TABLET BY MOUTH DAILY, Disp: 90 tablet, Rfl: 3   tolterodine (DETROL) 2 MG tablet, Take 1 tablet (2 mg total) by mouth 2 (two) times daily., Disp: 60 tablet, Rfl: 4  warfarin (COUMADIN) 3 MG tablet, TAKE 1 TABLET BY MOUTH DAILY, Disp: 30 tablet, Rfl: 6   warfarin (COUMADIN) 5 MG tablet, Take 1 tablet (5 mg total) by mouth daily., Disp: 30 tablet, Rfl: 3   No Known Allergies  ROS Review of Systems  Constitutional: Negative.   HENT: Negative.    Eyes: Negative.   Respiratory: Negative.    Cardiovascular: Negative.   Gastrointestinal: Negative.   Endocrine: Negative.   Genitourinary: Negative.   Musculoskeletal: Negative.   Skin: Negative.   Allergic/Immunologic: Negative.   Neurological: Negative.   Hematological: Negative.   Psychiatric/Behavioral: Negative.    All other systems reviewed and are negative.     Objective:    Physical Exam Vitals reviewed.  Constitutional:      Appearance: Normal appearance.  HENT:      Mouth/Throat:     Mouth: Mucous membranes are moist.  Eyes:     Pupils: Pupils are equal, round, and reactive to light.  Neck:     Vascular: No carotid bruit.  Cardiovascular:     Rate and Rhythm: Normal rate and regular rhythm.     Pulses: Normal pulses.     Heart sounds: Normal heart sounds.  Pulmonary:     Effort: Pulmonary effort is normal.     Breath sounds: Normal breath sounds.  Abdominal:     General: Bowel sounds are normal.     Palpations: Abdomen is soft. There is no hepatomegaly, splenomegaly or mass.     Tenderness: There is no abdominal tenderness.     Hernia: No hernia is present.  Musculoskeletal:     Cervical back: Neck supple.     Right lower leg: No edema.     Left lower leg: No edema.  Skin:    Findings: No rash.  Neurological:     Mental Status: He is alert and oriented to person, place, and time.     Motor: No weakness.  Psychiatric:        Mood and Affect: Mood normal.        Behavior: Behavior normal.     BP 131/86   Pulse 82   Ht 6' (1.829 m)   Wt 185 lb 4.8 oz (84.1 kg)   BMI 25.13 kg/m  Wt Readings from Last 3 Encounters:  11/25/21 185 lb 4.8 oz (84.1 kg)  11/18/21 185 lb 4.8 oz (84.1 kg)  10/14/21 176 lb 4.8 oz (80 kg)     Health Maintenance Due  Topic Date Due   Medicare Annual Wellness (AWV)  Never done   Zoster Vaccines- Shingrix (1 of 2) Never done   Pneumonia Vaccine 30+ Years old (1 - PCV) Never done    There are no preventive care reminders to display for this patient.  Lab Results  Component Value Date   TSH 0.72 12/05/2020   Lab Results  Component Value Date   WBC 7.4 11/18/2021   HGB 15.3 11/18/2021   HCT 45.8 11/18/2021   MCV 90.0 11/18/2021   PLT 141 11/18/2021   Lab Results  Component Value Date   NA 141 11/18/2021   K 4.6 11/18/2021   CO2 23 11/18/2021   GLUCOSE 76 11/18/2021   BUN 23 11/18/2021   CREATININE 1.60 (H) 11/18/2021   BILITOT 2.4 (H) 11/18/2021   ALKPHOS 52 10/08/2014   AST 34 11/18/2021    ALT 27 11/18/2021   PROT 7.1 11/18/2021   ALBUMIN 3.5 10/08/2014   CALCIUM 9.3 11/18/2021   ANIONGAP 6  12/23/2017   EGFR 40 (L) 11/18/2021   Lab Results  Component Value Date   CHOL 131 12/05/2020   Lab Results  Component Value Date   HDL 41 12/05/2020   Lab Results  Component Value Date   LDLCALC 71 12/05/2020   Lab Results  Component Value Date   TRIG 103 12/05/2020   Lab Results  Component Value Date   CHOLHDL 3.2 12/05/2020   No results found for: "HGBA1C"    Assessment & Plan:   Problem List Items Addressed This Visit       Cardiovascular and Mediastinum   Sick sinus syndrome ([place]) - Primary    Stable at the present time      Essential hypertension    Blood pressure is labile patient has orthostatic hypotension I checked his blood pressure standing it was 120/80        Endocrine   Hypothyroidism    Stable        Other   Unsteady gait    Patient was advised to walk with a stick       No orders of the defined types were placed in this encounter.   Follow-up: No follow-ups on file.    Cletis Athens, MD

## 2021-11-25 NOTE — Assessment & Plan Note (Signed)
Stable

## 2021-11-25 NOTE — Assessment & Plan Note (Signed)
Stable at the present time. 

## 2021-11-27 ENCOUNTER — Other Ambulatory Visit: Payer: Self-pay

## 2021-11-27 ENCOUNTER — Emergency Department
Admission: EM | Admit: 2021-11-27 | Discharge: 2021-11-27 | Disposition: A | Discharge status: Home / Self Care | Payer: Medicare PPO | Attending: Emergency Medicine | Admitting: Emergency Medicine

## 2021-11-27 DIAGNOSIS — Z79899 Other long term (current) drug therapy: Secondary | ICD-10-CM | POA: Diagnosis not present

## 2021-11-27 DIAGNOSIS — Z7901 Long term (current) use of anticoagulants: Secondary | ICD-10-CM | POA: Insufficient documentation

## 2021-11-27 DIAGNOSIS — R0902 Hypoxemia: Secondary | ICD-10-CM | POA: Diagnosis not present

## 2021-11-27 DIAGNOSIS — I10 Essential (primary) hypertension: Secondary | ICD-10-CM | POA: Insufficient documentation

## 2021-11-27 LAB — CBC WITH DIFFERENTIAL/PLATELET
Abs Immature Granulocytes: 0.02 10*3/uL (ref 0.00–0.07)
Basophils Absolute: 0.1 10*3/uL (ref 0.0–0.1)
Basophils Relative: 1 %
Eosinophils Absolute: 0.2 10*3/uL (ref 0.0–0.5)
Eosinophils Relative: 2 %
HCT: 47.8 % (ref 39.0–52.0)
Hemoglobin: 15.5 g/dL (ref 13.0–17.0)
Immature Granulocytes: 0 %
Lymphocytes Relative: 15 %
Lymphs Abs: 1.2 10*3/uL (ref 0.7–4.0)
MCH: 29.5 pg (ref 26.0–34.0)
MCHC: 32.4 g/dL (ref 30.0–36.0)
MCV: 91 fL (ref 80.0–100.0)
Monocytes Absolute: 0.7 10*3/uL (ref 0.1–1.0)
Monocytes Relative: 8 %
Neutro Abs: 6 10*3/uL (ref 1.7–7.7)
Neutrophils Relative %: 74 %
Platelets: 155 10*3/uL (ref 150–400)
RBC: 5.25 MIL/uL (ref 4.22–5.81)
RDW: 14.6 % (ref 11.5–15.5)
WBC: 8.1 10*3/uL (ref 4.0–10.5)
nRBC: 0 % (ref 0.0–0.2)

## 2021-11-27 LAB — COMPREHENSIVE METABOLIC PANEL
ALT: 27 U/L (ref 0–44)
AST: 41 U/L (ref 15–41)
Albumin: 4.3 g/dL (ref 3.5–5.0)
Alkaline Phosphatase: 136 U/L — ABNORMAL HIGH (ref 38–126)
Anion gap: 7 (ref 5–15)
BUN: 33 mg/dL — ABNORMAL HIGH (ref 8–23)
CO2: 27 mmol/L (ref 22–32)
Calcium: 9.4 mg/dL (ref 8.9–10.3)
Chloride: 106 mmol/L (ref 98–111)
Creatinine, Ser: 1.73 mg/dL — ABNORMAL HIGH (ref 0.61–1.24)
GFR, Estimated: 36 mL/min — ABNORMAL LOW (ref >60)
Glucose, Bld: 109 mg/dL — ABNORMAL HIGH (ref 70–99)
Potassium: 4.5 mmol/L (ref 3.5–5.1)
Sodium: 140 mmol/L (ref 135–145)
Total Bilirubin: 2.8 mg/dL — ABNORMAL HIGH (ref 0.3–1.2)
Total Protein: 7.8 g/dL (ref 6.5–8.1)

## 2021-11-27 LAB — PROTIME-INR
INR: 2.2 — ABNORMAL HIGH (ref 0.8–1.2)
Prothrombin Time: 24.2 seconds — ABNORMAL HIGH (ref 11.4–15.2)

## 2021-11-27 LAB — TSH: TSH: 3.946 u[IU]/mL (ref 0.350–4.500)

## 2021-11-27 NOTE — Discharge Instructions (Addendum)
Please follow-up with your doctor, Dr. Rebecka Apley in the next few days.  I am going to increase your now naldololto 40 mg or 2 pills a day.  This may work.  You may need some hydrochlorothiazide or something similar on top of it.  I want to go very slowly to not make your blood pressure get too low especially when you stand up.  Please return for any further problems.

## 2021-11-27 NOTE — ED Provider Notes (Signed)
[facility] Provider Note    Event Date/Time   First MD Initiated Contact with Patient 11/27/21 2107     (approximate)   History   Hypertension   HPI  Gregrey Bloyd is a 88 y.o. male who comes in complaining of hypertension.  He says he does not take anything for his blood pressure but reviewing his old record shows that he is actually on lisinopril 2.5 and now naldalal 20 every day.  Additionally he takes Coumadin and Lasix.  Patient denies any shortness of breath or chest pain or any other pain or discomfort.     Physical Exam   Triage Vital Signs: ED Triage Vitals  Enc Vitals Group     BP 11/27/21 2105 (!) 158/111     Pulse Rate 11/27/21 2105 75     Resp 11/27/21 2105 16     Temp 11/27/21 2105 97.6 F (36.4 C)     Temp Source 11/27/21 2105 Oral     SpO2 11/27/21 2105 100 %     Weight --      Height --      Head Circumference --      Peak Flow --      Pain Score 11/27/21 2108 0     Pain Loc --      Pain Edu? --      Excl. in [place]? --     Most recent vital signs: Vitals:   11/27/21 2300 11/27/21 2300  BP: (!) 150/103   Pulse: 71   Resp: 18   Temp:  97.8 F (36.6 C)  SpO2: 100%      General: Awake, no distress.  CV:  Good peripheral perfusion.  Heart regular rate and rhythm no audible murmurs Resp:  Normal effort.  Lungs are clear Abd:  No distention.  Soft and nontender Remedies with no edema   ED Results / Procedures / Treatments   Labs (all labs ordered are listed, but only abnormal results are displayed) Labs Reviewed  COMPREHENSIVE METABOLIC PANEL - Abnormal; Notable for the following components:      Result Value   Glucose, Bld 109 (*)    BUN 33 (*)    Creatinine, Ser 1.73 (*)    Alkaline Phosphatase 136 (*)    Total Bilirubin 2.8 (*)    GFR, Estimated 36 (*)    All other components within normal limits  PROTIME-INR - Abnormal; Notable for the following components:   Prothrombin Time 24.2 (*)    INR 2.2  (*)    All other components within normal limits  CBC WITH DIFFERENTIAL/PLATELET  TSH     EKG  KG read interpreted by me shows normal sinus rhythm rate of 71 left axis right bundle branch block no change from previous EKG of August of this year.   RADIOLOGY    PROCEDURES:  Critical Care performed:   Procedures   MEDICATIONS ORDERED IN ED: Medications - No data to display   IMPRESSION / MDM / [place] / ED COURSE  I reviewed the triage vital signs and the nursing notes. Patient's creatinine is 1.7.  Instead of increasing the lisinopril I will increase his nadolol to 40.  If this is not enough and it probably will not be he will be in good position to try some HCTZ along with the nadolol to decrease his blood pressure.  I will have him follow-up with his primary care provider.  He will have to be careful  as he does have orthostatic changes by history.  Differential diagnosis includes, but is not limited to, essential hypertension, secondary hypertension from any 1 of a number of causes or possibly from anxiety.  Patient's presentation is most consistent with acute complicated illness / injury requiring diagnostic workup.  The patient is on the cardiac monitor to evaluate for evidence of arrhythmia and/or significant heart rate changes none have been seen   FINAL CLINICAL IMPRESSION(S) / ED DIAGNOSES   Final diagnoses:  Primary hypertension     Rx / DC Orders   ED Discharge Orders     None        Note:  This document was prepared using Dragon voice recognition software and may include unintentional dictation errors.   Arnaldo Natal, MD 11/28/21 0005

## 2021-11-27 NOTE — ED Notes (Signed)
Called Tim, neighbor, he will come pick up the patient. ETA 20 minutes. [phone]

## 2021-11-27 NOTE — ED Triage Notes (Signed)
Patient brought in by EMS for hypertension. Patient BP on scene was 170/119. Patient has a pacemaker. Hard of hearing.

## 2021-12-02 ENCOUNTER — Ambulatory Visit (INDEPENDENT_AMBULATORY_CARE_PROVIDER_SITE_OTHER): Payer: Medicare PPO | Admitting: Internal Medicine

## 2021-12-02 VITALS — BP 140/87

## 2021-12-02 DIAGNOSIS — E038 Other specified hypothyroidism: Secondary | ICD-10-CM

## 2021-12-02 DIAGNOSIS — E063 Autoimmune thyroiditis: Secondary | ICD-10-CM

## 2021-12-02 DIAGNOSIS — I10 Essential (primary) hypertension: Secondary | ICD-10-CM

## 2021-12-02 DIAGNOSIS — I4811 Longstanding persistent atrial fibrillation: Secondary | ICD-10-CM | POA: Diagnosis not present

## 2021-12-02 DIAGNOSIS — I495 Sick sinus syndrome: Secondary | ICD-10-CM | POA: Diagnosis not present

## 2021-12-02 NOTE — Assessment & Plan Note (Signed)
No history of passing out

## 2021-12-02 NOTE — Assessment & Plan Note (Signed)
Under control 

## 2021-12-02 NOTE — Progress Notes (Signed)
Established Patient Office Visit  Subjective:  Patient ID: Johnny Norman, male    DOB: August 30, 1925  Age: 88 y.o. MRN: [id number]  CC: No chief complaint on file.   HPI  Johnny Norman presents for pacer check  Past Medical History:  Diagnosis Date   A-fib [facility])    CHF (congestive heart failure) ([place])    Claudication ([place])    leg   Hyperlipidemia    Peripheral vascular disease ([place])    Presence of permanent cardiac pacemaker    Small bowel obstruction (HCC)    Thyroid disease     Past Surgical History:  Procedure Laterality Date   APPENDECTOMY     BOWEL RESECTION  10/12/2014   Procedure: SMALL BOWEL RESECTION;  Surgeon: Hubbard Robinson, MD;  Location: [facility] ORS;  Service: General;;   CHOLECYSTECTOMY     COLONOSCOPY     INSERT / REPLACE / REMOVE PACEMAKER  09/13/2008   SSS s/p pacemaker implant; Medtronic pacemaker,.   LAPAROTOMY N/A 10/12/2014   Procedure: EXPLORATORY LAPAROTOMY;  Surgeon: Hubbard Robinson, MD;  Location: [facility] ORS;  Service: General;  Laterality: N/A;   PACEMAKER INSERTION N/A 12/30/2017   Procedure: GENERATOR CHANGE OUT- DUAL CHAMBER;  Surgeon: Cletis Athens, MD;  Location: [facility] ORS;  Service: Cardiovascular;  Laterality: N/A;   PROSTATE SURGERY      Family History  Family history unknown: Yes    Social History   Socioeconomic History   Marital status: Widowed    Spouse name: Not on file   Number of children: Not on file   Years of education: Not on file   Highest education level: Not on file  Occupational History   Not on file  Tobacco Use   Smoking status: Never   Smokeless tobacco: Never  Vaping Use   Vaping Use: Never used  Substance and Sexual Activity   Alcohol use: No    Comment: occasional   Drug use: No   Sexual activity: Yes    Birth control/protection: None  Other Topics Concern   Not on file  Social History Narrative   Not on file   Social Determinants of Health   Financial Resource Strain: Not on  file  Food Insecurity: Not on file  Transportation Needs: Not on file  Physical Activity: Not on file  Stress: Not on file  Social Connections: Not on file  Intimate Partner Violence: Not on file     Current Outpatient Medications:    atorvastatin (LIPITOR) 10 MG tablet, TAKE ONE TABLET BY MOUTH EVERY DAY, Disp: 90 tablet, Rfl: 3   Blood Pressure Monitor KIT, 1 each by Does not apply route daily., Disp: 1 kit, Rfl: 0   Cholecalciferol (VITAMIN D) 50 MCG (2000 UT) CAPS, Take 2,000 Units by mouth daily. , Disp: , Rfl:    furosemide (LASIX) 20 MG tablet, TAKE ONE TABLET (20 MG) BY MOUTH EVERY DAY, Disp: 30 tablet, Rfl: 3   levothyroxine (SYNTHROID) 88 MCG tablet, TAKE 1 TABLET EVERY DAY ON EMPTY STOMACHWITH A GLASS OF WATER AT LEAST 30-60 MINBEFORE BREAKFAST, Disp: 90 tablet, Rfl: 3   lisinopril (ZESTRIL) 2.5 MG tablet, TAKE 1 TABLET BY MOUTH DAILY, Disp: 30 tablet, Rfl: 6   Multiple Vitamin (MULTIVITAMIN) tablet, Take 1 tablet by mouth daily., Disp: , Rfl:    nadolol (CORGARD) 20 MG tablet, TAKE 1 TABLET BY MOUTH DAILY, Disp: 90 tablet, Rfl: 3   tolterodine (DETROL) 2 MG tablet, Take 1 tablet (2 mg total) by  mouth 2 (two) times daily., Disp: 60 tablet, Rfl: 4   warfarin (COUMADIN) 3 MG tablet, TAKE 1 TABLET BY MOUTH DAILY, Disp: 30 tablet, Rfl: 6   warfarin (COUMADIN) 5 MG tablet, Take 1 tablet (5 mg total) by mouth daily., Disp: 30 tablet, Rfl: 3   No Known Allergies  ROS Review of Systems  Constitutional: Negative.   HENT: Negative.    Eyes: Negative.   Respiratory: Negative.    Cardiovascular: Negative.   Gastrointestinal: Negative.   Endocrine: Negative.   Genitourinary: Negative.   Musculoskeletal: Negative.   Skin: Negative.   Allergic/Immunologic: Negative.   Neurological: Negative.   Hematological: Negative.   Psychiatric/Behavioral: Negative.    All other systems reviewed and are negative.     Objective:    Physical Exam Vitals reviewed.  Constitutional:       Appearance: Normal appearance.  HENT:     Mouth/Throat:     Mouth: Mucous membranes are moist.  Eyes:     Pupils: Pupils are equal, round, and reactive to light.  Neck:     Vascular: No carotid bruit.  Cardiovascular:     Rate and Rhythm: Normal rate. Rhythm irregular.     Pulses: Normal pulses.     Heart sounds: Normal heart sounds.  Pulmonary:     Effort: Pulmonary effort is normal.     Breath sounds: Normal breath sounds.  Abdominal:     General: Bowel sounds are normal.     Palpations: Abdomen is soft. There is no hepatomegaly, splenomegaly or mass.     Tenderness: There is no abdominal tenderness.     Hernia: No hernia is present.  Musculoskeletal:     Cervical back: Neck supple.     Right lower leg: No edema.     Left lower leg: No edema.  Skin:    Findings: No rash.  Neurological:     Mental Status: He is alert and oriented to person, place, and time.     Motor: No weakness.  Psychiatric:        Mood and Affect: Mood normal.        Behavior: Behavior normal.     BP (!) 140/87  Wt Readings from Last 3 Encounters:  11/25/21 185 lb 4.8 oz (84.1 kg)  11/18/21 185 lb 4.8 oz (84.1 kg)  10/14/21 176 lb 4.8 oz (80 kg)     Health Maintenance Due  Topic Date Due   Medicare Annual Wellness (AWV)  Never done   Zoster Vaccines- Shingrix (1 of 2) Never done   Pneumonia Vaccine 29+ Years old (1 - PCV) Never done    There are no preventive care reminders to display for this patient.  Lab Results  Component Value Date   TSH 3.946 11/27/2021   Lab Results  Component Value Date   WBC 8.1 11/27/2021   HGB 15.5 11/27/2021   HCT 47.8 11/27/2021   MCV 91.0 11/27/2021   PLT 155 11/27/2021   Lab Results  Component Value Date   NA 140 11/27/2021   K 4.5 11/27/2021   CO2 27 11/27/2021   GLUCOSE 109 (H) 11/27/2021   BUN 33 (H) 11/27/2021   CREATININE 1.73 (H) 11/27/2021   BILITOT 2.8 (H) 11/27/2021   ALKPHOS 136 (H) 11/27/2021   AST 41 11/27/2021   ALT 27  11/27/2021   PROT 7.8 11/27/2021   ALBUMIN 4.3 11/27/2021   CALCIUM 9.4 11/27/2021   ANIONGAP 7 11/27/2021   EGFR 40 (L) 11/18/2021  Lab Results  Component Value Date   CHOL 131 12/05/2020   Lab Results  Component Value Date   HDL 41 12/05/2020   Lab Results  Component Value Date   LDLCALC 71 12/05/2020   Lab Results  Component Value Date   TRIG 103 12/05/2020   Lab Results  Component Value Date   CHOLHDL 3.2 12/05/2020   No results found for: "HGBA1C"    Assessment & Plan:   Problem List Items Addressed This Visit       Cardiovascular and Mediastinum   Atrial fibrillation ([place])    Under control      Sick sinus syndrome ([place]) - Primary    No history of passing out      Essential hypertension    Under control        Endocrine   Hypothyroidism    Stable at the present time      Note: Medical Device Follow-up  Patient pacemaker was interrogated by pacemakers analyzer, battery status is okay.  No programming changes were indicated after the review of the data.  Histogram shows no change since the last interrogation Atrial and ventricular sensing thresholds were found to be acceptable Impedance was checked and it was found to be normal.  Thresholds were found to be okay on evaluation of rhythm problem.  No high rate or low rate arrhythmia were noted.  Estimated battery longevity is9.1  yr. I have personally reviewed the device data and amended the report as necessary.   No orders of the defined types were placed in this encounter.   Follow-up: No follow-ups on file.    Cletis Athens, MD

## 2021-12-02 NOTE — Assessment & Plan Note (Signed)
Stable at the present time. 

## 2021-12-06 ENCOUNTER — Other Ambulatory Visit: Payer: Self-pay

## 2021-12-06 DIAGNOSIS — I4891 Unspecified atrial fibrillation: Secondary | ICD-10-CM

## 2021-12-06 MED ORDER — WARFARIN SODIUM 5 MG PO TABS: 5.0000 mg | ORAL_TABLET | Freq: Every day | ORAL | 1 refills | Status: DC

## 2021-12-12 ENCOUNTER — Ambulatory Visit (INDEPENDENT_AMBULATORY_CARE_PROVIDER_SITE_OTHER): Payer: Medicare PPO | Admitting: Nurse Practitioner

## 2021-12-12 VITALS — BP 116/79 | HR 73 | Temp 97.5°F | Resp 99 | Wt 181.3 lb

## 2021-12-12 DIAGNOSIS — N3281 Overactive bladder: Secondary | ICD-10-CM | POA: Diagnosis not present

## 2021-12-12 NOTE — Progress Notes (Signed)
Established Patient Office Visit  Subjective:  Patient ID: Johnny Norman, male    DOB: Nov 11, 1925  Age: 88 y.o. MRN: [id number]  CC:  Chief Complaint  Patient presents with   Over Active Bladder    Wants to know if he can double his medication.      HPI  Johnny Norman  88 year old male presents for over active bladder.   He is taking 2 mg once daily instead of 2 mg twice a day. He lives by himself and drove to the office by himself.  HPI   Past Medical History:  Diagnosis Date   A-fib [facility])    CHF (congestive heart failure) (HCC)    Claudication (HCC)    leg   Hyperlipidemia    Peripheral vascular disease (HCC)    Presence of permanent cardiac pacemaker    Small bowel obstruction (HCC)    Thyroid disease     Past Surgical History:  Procedure Laterality Date   APPENDECTOMY     BOWEL RESECTION  10/12/2014   Procedure: SMALL BOWEL RESECTION;  Surgeon: Hubbard Robinson, MD;  Location: [facility] ORS;  Service: General;;   CHOLECYSTECTOMY     COLONOSCOPY     INSERT / REPLACE / REMOVE PACEMAKER  09/13/2008   SSS s/p pacemaker implant; Medtronic pacemaker,.   LAPAROTOMY N/A 10/12/2014   Procedure: EXPLORATORY LAPAROTOMY;  Surgeon: Hubbard Robinson, MD;  Location: [facility] ORS;  Service: General;  Laterality: N/A;   PACEMAKER INSERTION N/A 12/30/2017   Procedure: GENERATOR CHANGE OUT- DUAL CHAMBER;  Surgeon: Cletis Athens, MD;  Location: [facility] ORS;  Service: Cardiovascular;  Laterality: N/A;   PROSTATE SURGERY      Family History  Family history unknown: Yes    Social History   Socioeconomic History   Marital status: Widowed    Spouse name: Not on file   Number of children: Not on file   Years of education: Not on file   Highest education level: Not on file  Occupational History   Not on file  Tobacco Use   Smoking status: Never   Smokeless tobacco: Never  Vaping Use   Vaping Use: Never used  Substance and Sexual Activity   Alcohol use: No     Comment: occasional   Drug use: No   Sexual activity: Yes    Birth control/protection: None  Other Topics Concern   Not on file  Social History Narrative   Not on file   Social Determinants of Health   Financial Resource Strain: Not on file  Food Insecurity: Not on file  Transportation Needs: Not on file  Physical Activity: Not on file  Stress: Not on file  Social Connections: Not on file  Intimate Partner Violence: Not on file     Outpatient Medications Prior to Visit  Medication Sig Dispense Refill   atorvastatin (LIPITOR) 10 MG tablet TAKE ONE TABLET BY MOUTH EVERY DAY 90 tablet 3   Blood Pressure Monitor KIT 1 each by Does not apply route daily. 1 kit 0   Cholecalciferol (VITAMIN D) 50 MCG (2000 UT) CAPS Take 2,000 Units by mouth daily.      furosemide (LASIX) 20 MG tablet TAKE ONE TABLET (20 MG) BY MOUTH EVERY DAY 30 tablet 3   levothyroxine (SYNTHROID) 88 MCG tablet TAKE 1 TABLET EVERY DAY ON EMPTY STOMACHWITH A GLASS OF WATER AT LEAST 30-60 MINBEFORE BREAKFAST 90 tablet 3   lisinopril (ZESTRIL) 2.5 MG tablet TAKE 1 TABLET BY MOUTH  DAILY 30 tablet 6   Multiple Vitamin (MULTIVITAMIN) tablet Take 1 tablet by mouth daily.     nadolol (CORGARD) 20 MG tablet TAKE 1 TABLET BY MOUTH DAILY 90 tablet 3   tolterodine (DETROL) 2 MG tablet Take 1 tablet (2 mg total) by mouth 2 (two) times daily. 60 tablet 4   warfarin (COUMADIN) 3 MG tablet TAKE 1 TABLET BY MOUTH DAILY 30 tablet 6   warfarin (COUMADIN) 5 MG tablet Take 1 tablet (5 mg total) by mouth daily. 90 tablet 1   No facility-administered medications prior to visit.    No Known Allergies  ROS Review of Systems  Constitutional: Negative.   HENT: Negative.    Eyes: Negative.   Respiratory: Negative.    Cardiovascular: Negative.   Gastrointestinal: Negative.   Endocrine: Negative.   Genitourinary: Negative.   Musculoskeletal: Negative.   Neurological: Negative.   Hematological: Negative.   Psychiatric/Behavioral:  Negative.        Objective:    Physical Exam Constitutional:      Appearance: Normal appearance. He is normal weight.  HENT:     Nose: Nose normal.  Eyes:     Conjunctiva/sclera: Conjunctivae normal.     Pupils: Pupils are equal, round, and reactive to light.  Cardiovascular:     Rate and Rhythm: Normal rate. Rhythm irregular.     Pulses: Normal pulses.  Pulmonary:     Effort: Pulmonary effort is normal.     Breath sounds: Normal breath sounds.  Abdominal:     General: Abdomen is flat. Bowel sounds are normal. There is no distension.     Palpations: Abdomen is soft.     Hernia: No hernia is present.  Skin:    General: Skin is warm.  Neurological:     General: No focal deficit present.     Mental Status: He is alert and oriented to person, place, and time. Mental status is at baseline.  Psychiatric:        Mood and Affect: Mood normal.        Behavior: Behavior normal.        Thought Content: Thought content normal.        Judgment: Judgment normal.     BP 116/79   Pulse 73   Temp (!) 97.5 F (36.4 C) (Temporal)   Resp (!) 99   Wt 181 lb 4.8 oz (82.2 kg)   BMI 24.59 kg/m  Wt Readings from Last 3 Encounters:  12/12/21 181 lb 4.8 oz (82.2 kg)  11/25/21 185 lb 4.8 oz (84.1 kg)  11/18/21 185 lb 4.8 oz (84.1 kg)     Health Maintenance  Topic Date Due   Medicare Annual Wellness (AWV)  Never done   Zoster Vaccines- Shingrix (1 of 2) Never done   Pneumonia Vaccine 57+ Years old (1 - PCV) Never done   COVID-19 Vaccine (4 - Pfizer series) 12/05/2022 (Originally 01/16/2020)   TETANUS/TDAP  12/05/2030   INFLUENZA VACCINE  Completed   HPV VACCINES  Aged Out    There are no preventive care reminders to display for this patient.  Lab Results  Component Value Date   TSH 3.946 11/27/2021   Lab Results  Component Value Date   WBC 8.1 11/27/2021   HGB 15.5 11/27/2021   HCT 47.8 11/27/2021   MCV 91.0 11/27/2021   PLT 155 11/27/2021   Lab Results  Component  Value Date   NA 140 11/27/2021   K 4.5 11/27/2021   CO2 27  11/27/2021   GLUCOSE 109 (H) 11/27/2021   BUN 33 (H) 11/27/2021   CREATININE 1.73 (H) 11/27/2021   BILITOT 2.8 (H) 11/27/2021   ALKPHOS 136 (H) 11/27/2021   AST 41 11/27/2021   ALT 27 11/27/2021   PROT 7.8 11/27/2021   ALBUMIN 4.3 11/27/2021   CALCIUM 9.4 11/27/2021   ANIONGAP 7 11/27/2021   EGFR 40 (L) 11/18/2021   Lab Results  Component Value Date   CHOL 131 12/05/2020   Lab Results  Component Value Date   HDL 41 12/05/2020   Lab Results  Component Value Date   LDLCALC 71 12/05/2020   Lab Results  Component Value Date   TRIG 103 12/05/2020   Lab Results  Component Value Date   CHOLHDL 3.2 12/05/2020   No results found for: "HGBA1C"    Assessment & Plan:   Problem List Items Addressed This Visit   None    No orders of the defined types were placed in this encounter.    Follow-up: No follow-ups on file.    Theresia Lo, NP

## 2021-12-13 ENCOUNTER — Other Ambulatory Visit: Payer: Self-pay | Admitting: Internal Medicine

## 2021-12-30 ENCOUNTER — Ambulatory Visit: Payer: Medicare PPO | Admitting: Internal Medicine

## 2022-01-09 ENCOUNTER — Encounter: Payer: Self-pay | Admitting: Nurse Practitioner

## 2022-01-09 DIAGNOSIS — N3281 Overactive bladder: Secondary | ICD-10-CM | POA: Insufficient documentation

## 2022-01-09 NOTE — Assessment & Plan Note (Signed)
Patient was taking 2 mg of Detrol once a day. Advised patient to take 2 mg twice a day. Will continue to monitor.

## 2022-01-10 ENCOUNTER — Telehealth: Payer: Self-pay

## 2022-01-10 NOTE — Telephone Encounter (Signed)
Patient has increased her detrol to 4 pills a day on his own and is still having accidents at night. His daughter called because he is trying to increase it to 6 pills. She has been instructed not to increase dose until hearing back. She has been advised rx is for two daily. He will need a refill because he is taking more than prescribed but it is not working for him. Please advise.

## 2022-02-03 ENCOUNTER — Encounter: Payer: Self-pay | Admitting: Internal Medicine

## 2022-02-03 ENCOUNTER — Other Ambulatory Visit: Payer: Self-pay | Admitting: Internal Medicine

## 2022-02-03 ENCOUNTER — Ambulatory Visit: Payer: Medicare PPO | Admitting: Internal Medicine

## 2022-02-03 VITALS — BP 132/78 | Ht 72.0 in | Wt 177.0 lb

## 2022-02-03 DIAGNOSIS — E063 Autoimmune thyroiditis: Secondary | ICD-10-CM | POA: Diagnosis not present

## 2022-02-03 DIAGNOSIS — N3942 Incontinence without sensory awareness: Secondary | ICD-10-CM

## 2022-02-03 DIAGNOSIS — R351 Nocturia: Secondary | ICD-10-CM | POA: Diagnosis not present

## 2022-02-03 DIAGNOSIS — I495 Sick sinus syndrome: Secondary | ICD-10-CM

## 2022-02-03 DIAGNOSIS — I10 Essential (primary) hypertension: Secondary | ICD-10-CM

## 2022-02-03 DIAGNOSIS — R972 Elevated prostate specific antigen [PSA]: Secondary | ICD-10-CM | POA: Diagnosis not present

## 2022-02-03 DIAGNOSIS — E038 Other specified hypothyroidism: Secondary | ICD-10-CM | POA: Diagnosis not present

## 2022-02-03 LAB — POCT INR
POC INR: 2.6
PT: 27.9

## 2022-02-03 NOTE — Assessment & Plan Note (Signed)
Stable at the present time patient pro time is 2.6 he was advised to continue same dose of Coumadin.

## 2022-02-03 NOTE — Assessment & Plan Note (Signed)
Refer to urologist, Flomax not ordered because the patient dizziness, patient is on Detrol 1 twice a day 2 mg

## 2022-02-03 NOTE — Assessment & Plan Note (Signed)
Refer to urologist 

## 2022-02-03 NOTE — Progress Notes (Signed)
Established Patient Office Visit  Subjective:  Patient ID: Johnny Norman, male    DOB: 19-Aug-1925  Age: 89 y.o. MRN: [id number]  CC:  Chief Complaint  Patient presents with   Follow-up   Nocturia   Dizziness    Dizziness Pertinent negatives include no arthralgias or chest pain.    Johnny Norman presents for check up  Past Medical History:  Diagnosis Date   A-fib [facility])    CHF (congestive heart failure) (HCC)    Claudication (HCC)    leg   Hyperlipidemia    Peripheral vascular disease (HCC)    Presence of permanent cardiac pacemaker    Small bowel obstruction (HCC)    Thyroid disease     Past Surgical History:  Procedure Laterality Date   APPENDECTOMY     BOWEL RESECTION  10/12/2014   Procedure: SMALL BOWEL RESECTION;  Surgeon: Gladis Riffle, MD;  Location: [facility] ORS;  Service: General;;   CHOLECYSTECTOMY     COLONOSCOPY     INSERT / REPLACE / REMOVE PACEMAKER  09/13/2008   SSS s/p pacemaker implant; Medtronic pacemaker,.   LAPAROTOMY N/A 10/12/2014   Procedure: EXPLORATORY LAPAROTOMY;  Surgeon: Gladis Riffle, MD;  Location: [facility] ORS;  Service: General;  Laterality: N/A;   PACEMAKER INSERTION N/A 12/30/2017   Procedure: GENERATOR CHANGE OUT- DUAL CHAMBER;  Surgeon: Corky Downs, MD;  Location: [facility] ORS;  Service: Cardiovascular;  Laterality: N/A;   PROSTATE SURGERY      Family History  Family history unknown: Yes    Social History   Socioeconomic History   Marital status: Widowed    Spouse name: Not on file   Number of children: Not on file   Years of education: Not on file   Highest education level: Not on file  Occupational History   Not on file  Tobacco Use   Smoking status: Never   Smokeless tobacco: Never  Vaping Use   Vaping Use: Never used  Substance and Sexual Activity   Alcohol use: No    Comment: occasional   Drug use: No   Sexual activity: Yes    Birth control/protection: None  Other Topics Concern   Not on  file  Social History Narrative   Not on file   Social Determinants of Health   Financial Resource Strain: Not on file  Food Insecurity: Not on file  Transportation Needs: Not on file  Physical Activity: Not on file  Stress: Not on file  Social Connections: Not on file  Intimate Partner Violence: Not on file     Current Outpatient Medications:    atorvastatin (LIPITOR) 10 MG tablet, TAKE ONE TABLET BY MOUTH EVERY DAY, Disp: 90 tablet, Rfl: 3   Blood Pressure Monitor KIT, 1 each by Does not apply route daily., Disp: 1 kit, Rfl: 0   Cholecalciferol (VITAMIN D) 50 MCG (2000 UT) CAPS, Take 2,000 Units by mouth daily. , Disp: , Rfl:    furosemide (LASIX) 20 MG tablet, TAKE ONE TABLET (20 MG) BY MOUTH EVERY DAY, Disp: 30 tablet, Rfl: 3   levothyroxine (SYNTHROID) 88 MCG tablet, TAKE 1 TABLET EVERY DAY ON EMPTY STOMACHWITH A GLASS OF WATER AT LEAST 30-60 MINBEFORE BREAKFAST, Disp: 90 tablet, Rfl: 3   lisinopril (ZESTRIL) 2.5 MG tablet, TAKE 1 TABLET BY MOUTH DAILY, Disp: 90 tablet, Rfl: 1   Multiple Vitamin (MULTIVITAMIN) tablet, Take 1 tablet by mouth daily., Disp: , Rfl:    nadolol (CORGARD) 20 MG tablet, TAKE 1 TABLET  BY MOUTH DAILY, Disp: 90 tablet, Rfl: 3   tolterodine (DETROL) 2 MG tablet, TAKE ONE TABLET BY MOUTH TWICE DAILY, Disp: 60 tablet, Rfl: 4   warfarin (COUMADIN) 3 MG tablet, TAKE 1 TABLET BY MOUTH DAILY, Disp: 30 tablet, Rfl: 6   warfarin (COUMADIN) 5 MG tablet, Take 1 tablet (5 mg total) by mouth daily., Disp: 90 tablet, Rfl: 1   No Known Allergies  ROS Review of Systems  Eyes:  Negative for itching.  Respiratory:  Negative for chest tightness.   Cardiovascular:  Negative for chest pain and leg swelling.  Gastrointestinal:  Negative for abdominal distention.  Genitourinary:  Positive for difficulty urinating. Negative for penile pain.  Musculoskeletal:  Negative for arthralgias.  Neurological:  Positive for dizziness.  Psychiatric/Behavioral:  Negative for dysphoric  mood.       Objective:    Physical Exam Vitals reviewed.  Constitutional:      Appearance: Normal appearance.  HENT:     Mouth/Throat:     Mouth: Mucous membranes are moist.  Eyes:     Pupils: Pupils are equal, round, and reactive to light.  Neck:     Vascular: No carotid bruit.  Cardiovascular:     Rate and Rhythm: Normal rate. Rhythm irregular.     Pulses: Normal pulses.     Heart sounds: Normal heart sounds.  Pulmonary:     Effort: Pulmonary effort is normal.     Breath sounds: Normal breath sounds.  Abdominal:     General: Bowel sounds are normal.     Palpations: Abdomen is soft. There is no hepatomegaly, splenomegaly or mass.     Tenderness: There is no abdominal tenderness.     Hernia: No hernia is present.  Musculoskeletal:     Cervical back: Neck supple.     Right lower leg: No edema.     Left lower leg: No edema.  Skin:    Findings: No rash.  Neurological:     Mental Status: He is alert and oriented to person, place, and time.     Motor: No weakness.  Psychiatric:        Mood and Affect: Mood normal.        Behavior: Behavior normal.     BP 132/78   Ht 6' (1.829 m)   Wt 177 lb (80.3 kg)   BMI 24.01 kg/m  Wt Readings from Last 3 Encounters:  02/03/22 177 lb (80.3 kg)  12/12/21 181 lb 4.8 oz (82.2 kg)  11/25/21 185 lb 4.8 oz (84.1 kg)     Health Maintenance Due  Topic Date Due   Medicare Annual Wellness (AWV)  Never done   Zoster Vaccines- Shingrix (1 of 2) Never done   Pneumonia Vaccine 70+ Years old (1 - PCV) Never done    There are no preventive care reminders to display for this patient.  Lab Results  Component Value Date   TSH 3.946 11/27/2021   Lab Results  Component Value Date   WBC 8.1 11/27/2021   HGB 15.5 11/27/2021   HCT 47.8 11/27/2021   MCV 91.0 11/27/2021   PLT 155 11/27/2021   Lab Results  Component Value Date   NA 140 11/27/2021   K 4.5 11/27/2021   CO2 27 11/27/2021   GLUCOSE 109 (H) 11/27/2021   BUN 33 (H)  11/27/2021   CREATININE 1.73 (H) 11/27/2021   BILITOT 2.8 (H) 11/27/2021   ALKPHOS 136 (H) 11/27/2021   AST 41 11/27/2021   ALT 27 11/27/2021  PROT 7.8 11/27/2021   ALBUMIN 4.3 11/27/2021   CALCIUM 9.4 11/27/2021   ANIONGAP 7 11/27/2021   EGFR 40 (L) 11/18/2021   Lab Results  Component Value Date   CHOL 131 12/05/2020   Lab Results  Component Value Date   HDL 41 12/05/2020   Lab Results  Component Value Date   LDLCALC 71 12/05/2020   Lab Results  Component Value Date   TRIG 103 12/05/2020   Lab Results  Component Value Date   CHOLHDL 3.2 12/05/2020   No results found for: "HGBA1C"    Assessment & Plan:   Problem List Items Addressed This Visit       Cardiovascular and Mediastinum   Sick sinus syndrome ([place]) - Primary    Stable at the present time patient pro time is 2.6 he was advised to continue same dose of Coumadin.      Relevant Orders   Ambulatory referral to Urology   POCT INR (Completed)   Essential hypertension    Blood pressure is stable      Relevant Orders   Ambulatory referral to Urology   POCT INR (Completed)     Endocrine   Hypothyroidism    Taking medicine for thing in the morning empty stomach      Relevant Orders   Ambulatory referral to Urology   POCT INR (Completed)     Other   Elevated prostate specific antigen (PSA)    Refer to urologist      Nocturia    Refer to urologist, Flomax not ordered because the patient dizziness, patient is on Detrol 1 twice a day 2 mg      Relevant Orders   Ambulatory referral to Urology    No orders of the defined types were placed in this encounter.   Follow-up: No follow-ups on file.    Cletis Athens, MD

## 2022-02-03 NOTE — Assessment & Plan Note (Signed)
Blood pressure is stable 

## 2022-02-03 NOTE — Assessment & Plan Note (Signed)
Taking medicine for thing in the morning empty stomach

## 2022-02-21 ENCOUNTER — Other Ambulatory Visit: Payer: Self-pay | Admitting: Internal Medicine

## 2022-02-21 DIAGNOSIS — I4891 Unspecified atrial fibrillation: Secondary | ICD-10-CM

## 2022-02-25 ENCOUNTER — Other Ambulatory Visit: Payer: Self-pay | Admitting: Internal Medicine

## 2022-03-12 ENCOUNTER — Ambulatory Visit (INDEPENDENT_AMBULATORY_CARE_PROVIDER_SITE_OTHER): Payer: Medicare PPO | Admitting: Urology

## 2022-03-12 VITALS — BP 122/85 | HR 80 | Ht 76.0 in | Wt 180.0 lb

## 2022-03-12 DIAGNOSIS — N3281 Overactive bladder: Secondary | ICD-10-CM | POA: Diagnosis not present

## 2022-03-12 DIAGNOSIS — Z125 Encounter for screening for malignant neoplasm of prostate: Secondary | ICD-10-CM | POA: Diagnosis not present

## 2022-03-12 DIAGNOSIS — R351 Nocturia: Secondary | ICD-10-CM

## 2022-03-12 LAB — BLADDER SCAN AMB NON-IMAGING: SCA Result: 6

## 2022-03-12 MED ORDER — MIRABEGRON ER 25 MG PO TB24: 25.0000 mg | ORAL_TABLET | Freq: Every day | ORAL | 3 refills | Status: DC

## 2022-03-12 NOTE — Progress Notes (Signed)
03/12/22 1:18 PM   Rikki Spearing [place]ough [birth year] [id number]  CC: Urinary symptoms, nocturia, PSA screening  HPI: 89 year old male here today for urinary symptoms and nocturia referred by PCP.  I actually saw him previously in 2019 and 2020.  He had previously been followed by Dr. Jacqlyn Larsen and underwent a TURP over 20 years ago.  He had been on Myrbetriq 25 mg twice daily for OAB symptoms, but that was ultimately discontinued.  He was doing well at our last visit PVR was normal at 71m.  Most of the history is obtained from his son and daughter who are with him in clinic today.  He really denies any urinary complaints today, but sounds like he was having some frequency and nocturia that prompted his PCP to start him on Detrol.  PCP also checked a PSA which was normal at 1.3.  PVR today is normal at 654m  He is having nocturia 1 or 2 times at night.  He does have some lower extremity edema.   PMH: Past Medical History:  Diagnosis Date   A-fib ([facility]   CHF (congestive heart failure) (HCC)    Claudication (HCC)    leg   Hyperlipidemia    Peripheral vascular disease (HCC)    Presence of permanent cardiac pacemaker    Small bowel obstruction (HCC)    Thyroid disease     Surgical History: Past Surgical History:  Procedure Laterality Date   APPENDECTOMY     BOWEL RESECTION  10/12/2014   Procedure: SMALL BOWEL RESECTION;  Surgeon: CaHubbard RobinsonMD;  Location: [facility] ORS;  Service: General;;   CHOLECYSTECTOMY     COLONOSCOPY     INSERT / REPLACE / REMOVE PACEMAKER  09/13/2008   SSS s/p pacemaker implant; Medtronic pacemaker,.   LAPAROTOMY N/A 10/12/2014   Procedure: EXPLORATORY LAPAROTOMY;  Surgeon: CaHubbard RobinsonMD;  Location: [facility] ORS;  Service: General;  Laterality: N/A;   PACEMAKER INSERTION N/A 12/30/2017   Procedure: GENERATOR CHANGE OUT- DUAL CHAMBER;  Surgeon: MaCletis AthensMD;  Location: [facility] ORS;  Service: Cardiovascular;  Laterality: N/A;   PROSTATE SURGERY       Family History: Family History  Family history unknown: Yes    Social History:  reports that he has never smoked. He has never used smokeless tobacco. He reports that he does not drink alcohol and does not use drugs.  Physical Exam: BP 122/85   Pulse 80   Ht 6' 4"$  (1.93 m)   Wt 180 lb (81.6 kg)   BMI 21.91 kg/m    Constitutional:  Alert and oriented, No acute distress. Cardiovascular: No clubbing, cyanosis, or edema. Respiratory: Normal respiratory effort, no increased work of breathing. GI: Abdomen is soft, nontender, nondistended, no abdominal masses   Assessment & Plan:   [age]ear old male with distant history of TURP with Dr. CoJacqlyn Larsenver 20 years ago, mild symptoms of urinary frequency and nocturia, previously well-managed on Myrbetriq.  Was recently started on anticholinergic Detrol by PCP which has improved some of his urinary frequency and nocturia, but he is also had some increased confusion.  Would not recommend anticholinergics with his age and high risk for falls/confusion, and recommended changing back to the Myrbetriq 25 mg daily.  We also reviewed behavioral strategies including avoiding bladder irritants, minimizing fluids prior to bedtime, and wearing compression socks/elevating the legs in the mid afternoon.  I think at age [age] also need to have some reasonable expectations regarding his urinary symptoms, and he  is emptying completely.  No further PSA screening needed based on his age and normal PSA.  Stop Detrol, replaced with Myrbetriq 25 mg daily.  Okay to increase to Myrbetriq 50 mg daily if persistent frequency/nocturia RTC 4 months PVR, sooner if problems  Nickolas Madrid, MD 03/12/2022  [facility] [address], [place], [place] [address] [phone]

## 2022-04-03 ENCOUNTER — Encounter: Payer: Self-pay | Admitting: Cardiovascular Disease

## 2022-04-04 NOTE — Telephone Encounter (Signed)
Will check with provider.    Brynda Peon, RN  You2 hours ago (8:04 AM)    Good Morning Pam, That would be up to Dr Rockey Situ.  You would need to check with him to see if he is agreeable to assume care of this pt's INR without him re-establishing first.  Normally we do not do that, but since the pt has been seen in the past it would be up to Dr Rockey Situ.  Would need to document in pt's chart if he is agreeable to that and then you could have pt come into lab for PT/INR and send results to [place] and we can get pt established into our Coumadin [facility].  Or if there is an extender available to see this pt sooner that would be preferred.Sounds like this pt probably needs to get a new PCP if Dr Paticia Stack office has been closed for 30 days.Marland KitchenMarland KitchenMarland Kitchen

## 2022-04-04 NOTE — Telephone Encounter (Signed)
Attempted to call Dr. Lavera Guise office but had to leave a message.   Patient last seen in our office was 10/05/2017

## 2022-04-07 NOTE — Telephone Encounter (Signed)
Called pt's son, Bolton to schedule pt a New Coumadin appt with [facility] in [place]. No answer. Left message on voicemail with call back number.

## 2022-04-08 ENCOUNTER — Telehealth: Payer: Self-pay | Admitting: Cardiovascular Disease

## 2022-04-08 NOTE — Telephone Encounter (Signed)
Pt's son returned call and stated pt's PCP,  Dr Lavera Guise is now back in his office and pt has an appt with him tomorrow. Pt wishes to continue to have his INR checked with Dr Lavera Guise at this time since he is already established there. Made him aware that it is important that pt keeps upcoming appt with Dr Rockey Situ in May, 2024 and he can call back is anything changes concerning his Warfarin/INR management.

## 2022-04-08 NOTE — Telephone Encounter (Signed)
Spoke with patient's son and informed him that the 3 appointments are as follows:  1. With cardiologist (Dr. Rockey Situ, already scheduled) 2. With EP (scheduling tried to reach but left voicemail) 3. With INR management (scheduling tried to reach but left voicemail)  Son stated that they have a cardiologist appointment 04/09/22 with Dr. Lavera Guise and depending on what new instruction/recommendation comes from that appointment he will call back to schedule EP and INR management appointments.

## 2022-04-08 NOTE — Telephone Encounter (Signed)
Pt son called in confused on what 3 appt he needs. Informed him that I see Dr. Rockey Situ mentioned EP, not sure if RN was going to put in a referral for that or not.   He also stated that pt's insurance is not in network, so pt cannot have INR managed there anymore. He would need to sch new coumadin here.   Pt son asked that RN he spoke to earlier [employer]) call him back.

## 2022-04-19 ENCOUNTER — Other Ambulatory Visit: Payer: Self-pay | Admitting: Nurse Practitioner

## 2022-04-19 DIAGNOSIS — I4891 Unspecified atrial fibrillation: Secondary | ICD-10-CM

## 2022-04-22 NOTE — Telephone Encounter (Signed)
I called the patient and LVM for him to call back, to tell him we received a refill request for coumadin and he needs to call the [facility] office to get a refill on his coumadin until he sees [place] at this office.  Sota Hetz,cma

## 2022-04-23 NOTE — Telephone Encounter (Signed)
Tried to call patient line busy

## 2022-06-17 ENCOUNTER — Ambulatory Visit: Payer: Medicare PPO | Admitting: Cardiovascular Disease

## 2022-07-16 ENCOUNTER — Encounter: Payer: Self-pay | Admitting: Urology

## 2022-07-16 ENCOUNTER — Ambulatory Visit: Payer: Medicare PPO | Admitting: Urology

## 2022-07-16 VITALS — BP 123/82 | HR 83 | Wt 180.0 lb

## 2022-07-16 DIAGNOSIS — N3281 Overactive bladder: Secondary | ICD-10-CM

## 2022-07-16 DIAGNOSIS — N3944 Nocturnal enuresis: Secondary | ICD-10-CM | POA: Diagnosis not present

## 2022-07-16 LAB — BLADDER SCAN AMB NON-IMAGING

## 2022-07-16 MED ORDER — MIRABEGRON ER 50 MG PO TB24: 50.0000 mg | ORAL_TABLET | Freq: Every day | ORAL | 11 refills | Status: DC

## 2022-07-16 NOTE — Progress Notes (Signed)
   07/16/2022 12:30 PM   Johnny Norman [birth year] [id number]  Reason for visit: Follow up nocturia, morning incontinence, OAB  HPI: Frail 89 year old male who I saw in February 2024 for nocturia, and incontinence overnight requiring a depends.  His daughter is here with him again today who helps provide a lot of the history.  He had previously been trialed on Detrol by PCP, and I discontinued that medication with his high risk for confusion and falls.  He has nocturia typically 1 or 2 times overnight in addition to waking up with some damp depends.  He has been on Myrbetriq 25 mg daily which she thinks may be helpful.  PVRs have been normal, including normal at 6ml today.  He denies any urinary complaints at all during the day.  He continues to have some lower extremity edema especially in the evenings that likely contributes to his nocturia, and we again reviewed behavioral strategies including compression socks during the day, elevation of the legs.  We also reviewed minimizing fluids prior to bedtime, and double voiding prior to bed.  PCP checked PSA in October 2023 which was normal at 1.3.  I had a long conversation with them about realistic expectations with his age and frailty.  I think it is reasonable to increase the Myrbetriq to 50 mg daily, he could also try taking this in the evening instead of the morning to see if it helps with his overnight incontinence and nocturia 1-2 times.  Behavioral strategies again discussed at length including compression socks during the day, elevation of the legs in the afternoon, and minimizing fluids prior to bedtime.  Could consider changing Myrbetriq to [facility] in the future if persistent nocturia or incontinence overnight in the future.  RTC with PA 6 to 8 weeks PVR and symptom check  Sondra Come, MD  [facility] [address], [address] [place], [place] [address] [phone]

## 2022-07-16 NOTE — Patient Instructions (Signed)

## 2022-08-02 ENCOUNTER — Other Ambulatory Visit: Payer: Self-pay | Admitting: Nurse Practitioner

## 2022-08-08 NOTE — Telephone Encounter (Signed)
Please inform the pharmacy to send the refill to  Dr. Juel Burrow.

## 2022-08-27 ENCOUNTER — Other Ambulatory Visit: Payer: Self-pay | Admitting: Nurse Practitioner

## 2022-08-27 DIAGNOSIS — I4891 Unspecified atrial fibrillation: Secondary | ICD-10-CM

## 2022-08-28 NOTE — Telephone Encounter (Signed)
Pt is Dr. Fredna Dow pt please advise pharmacy to send the medication to dr. Fredna Dow office for refill.

## 2022-09-11 ENCOUNTER — Ambulatory Visit: Payer: Medicare PPO | Admitting: Physician Assistant

## 2022-09-11 VITALS — BP 132/88 | HR 87 | Ht 76.0 in | Wt 176.5 lb

## 2022-09-11 DIAGNOSIS — N3281 Overactive bladder: Secondary | ICD-10-CM | POA: Diagnosis not present

## 2022-09-11 LAB — BLADDER SCAN AMB NON-IMAGING: Scan Result: 0

## 2022-09-11 MED ORDER — GEMTESA 75 MG PO TABS: 75.0000 mg | ORAL_TABLET | Freq: Every day | ORAL | Status: DC

## 2022-09-11 NOTE — Progress Notes (Signed)
09/11/2022 11:35 AM   Marrianne Mood Shawhan Jan 19, 1926 [id number]  CC: Chief Complaint  Patient presents with   Over Active Bladder   HPI: Johnny Norman is a 89 y.o. male with PMH nocturia/nocturnal enuresis who presents today for him to recheck on Myrbetriq 50 mg.   Today he reports he has not noticed a significant change in his symptoms since increasing Myrbetriq from 25 to 50 mg daily.  He continues to complain of wet pajamas in the morning.  PVR 0 mL.  PMH: Past Medical History:  Diagnosis Date   A-fib (HCC)    CHF (congestive heart failure) (HCC)    Claudication (HCC)    leg   Hyperlipidemia    Peripheral vascular disease (HCC)    Presence of permanent cardiac pacemaker    Small bowel obstruction (HCC)    Thyroid disease     Surgical History: Past Surgical History:  Procedure Laterality Date   APPENDECTOMY     BOWEL RESECTION  10/12/2014   Procedure: SMALL BOWEL RESECTION;  Surgeon: Gladis Riffle, MD;  Location: [facility] ORS;  Service: General;;   CHOLECYSTECTOMY     COLONOSCOPY     INSERT / REPLACE / REMOVE PACEMAKER  09/13/2008   SSS s/p pacemaker implant; Medtronic pacemaker,.   LAPAROTOMY N/A 10/12/2014   Procedure: EXPLORATORY LAPAROTOMY;  Surgeon: Gladis Riffle, MD;  Location: [facility] ORS;  Service: General;  Laterality: N/A;   PACEMAKER INSERTION N/A 12/30/2017   Procedure: GENERATOR CHANGE OUT- DUAL CHAMBER;  Surgeon: Corky Downs, MD;  Location: [facility] ORS;  Service: Cardiovascular;  Laterality: N/A;   PROSTATE SURGERY      Home Medications:  Allergies as of 09/11/2022   No Known Allergies      Medication List        Accurate as of September 11, 2022 11:35 AM. If you have any questions, ask your nurse or doctor.          STOP taking these medications    mirabegron ER 50 MG Tb24 tablet Commonly known as: MYRBETRIQ   Vitamin D 50 MCG (2000 UT) Caps       TAKE these medications    atorvastatin 10 MG tablet Commonly known  as: LIPITOR TAKE ONE TABLET BY MOUTH EVERY DAY   Blood Pressure Monitor Kit 1 each by Does not apply route daily.   furosemide 20 MG tablet Commonly known as: LASIX TAKE ONE TABLET (20 MG) BY MOUTH EVERY DAY   Gemtesa 75 MG Tabs Generic drug: Vibegron Take 1 tablet (75 mg total) by mouth daily.   levothyroxine 88 MCG tablet Commonly known as: SYNTHROID TAKE 1 TABLET EVERY DAY ON EMPTY STOMACHWITH A GLASS OF WATER AT LEAST 30-60 MINBEFORE BREAKFAST   lisinopril 2.5 MG tablet Commonly known as: ZESTRIL TAKE 1 TABLET BY MOUTH DAILY   multivitamin tablet Take 1 tablet by mouth daily.   nadolol 20 MG tablet Commonly known as: CORGARD TAKE 1 TABLET BY MOUTH DAILY   warfarin 5 MG tablet Commonly known as: COUMADIN Take 1 tablet (5 mg total) by mouth daily. What changed: Another medication with the same name was removed. Continue taking this medication, and follow the directions you see here.        Allergies:  No Known Allergies  Family History: Family History  Family history unknown: Yes    Social History:   reports that he has never smoked. He has never been exposed to tobacco smoke. He has never used smokeless tobacco. He  reports that he does not drink alcohol and does not use drugs.  Physical Exam: BP 132/88   Pulse 87   Ht 6\' 4"  (1.93 m)   Wt 176 lb 8 oz (80.1 kg)   BMI 21.48 kg/m   Constitutional:  Alert, no acute distress, nontoxic appearing HEENT: [place], AT, hard of hearing Cardiovascular: No clubbing, cyanosis, or edema Respiratory: Normal respiratory effort, no increased work of breathing Skin: No rashes, bruises or suspicious lesions Neurologic: Grossly intact, no focal deficits, moving all 4 extremities Psychiatric: Normal mood and affect  Laboratory Data: Results for orders placed or performed in visit on 09/11/22  Bladder Scan (Post Void Residual) in office  Result Value Ref Range   Scan Result 0 ml    Assessment & Plan:   1. OAB (overactive  bladder) No symptomatic improvement after increasing Myrbetriq to 50 mg.  Will switch to Gemtesa to see if he has better therapeutic with this and see him back in clinic in 6 weeks for symptom recheck and PVR.  He is in agreement with this plan.  I do not recommend desmopressin with his age and frailty.  May consider PTNS in the future. - Bladder Scan (Post Void Residual) in office - Vibegron (GEMTESA) 75 MG TABS; Take 1 tablet (75 mg total) by mouth daily.  Return in about 6 weeks (around 10/23/2022) for Symptom recheck with PVR.  Carman Ching, PA-C  [facility] [place] [address] [place], [place] [address] [phone]

## 2022-10-01 DIAGNOSIS — H903 Sensorineural hearing loss, bilateral: Secondary | ICD-10-CM | POA: Diagnosis not present

## 2022-10-22 DIAGNOSIS — E039 Hypothyroidism, unspecified: Secondary | ICD-10-CM | POA: Diagnosis not present

## 2022-10-22 DIAGNOSIS — E785 Hyperlipidemia, unspecified: Secondary | ICD-10-CM | POA: Diagnosis not present

## 2022-10-22 DIAGNOSIS — I4892 Unspecified atrial flutter: Secondary | ICD-10-CM | POA: Diagnosis not present

## 2022-10-22 DIAGNOSIS — I4891 Unspecified atrial fibrillation: Secondary | ICD-10-CM | POA: Diagnosis not present

## 2022-10-27 ENCOUNTER — Ambulatory Visit: Payer: Medicare PPO | Admitting: Physician Assistant

## 2022-10-29 ENCOUNTER — Ambulatory Visit: Payer: Medicare PPO | Admitting: Physician Assistant

## 2022-10-29 ENCOUNTER — Encounter: Payer: Self-pay | Admitting: Physician Assistant

## 2022-10-29 VITALS — BP 132/88 | HR 85 | Ht 76.0 in | Wt 180.0 lb

## 2022-10-29 DIAGNOSIS — N3281 Overactive bladder: Secondary | ICD-10-CM

## 2022-10-29 LAB — BLADDER SCAN AMB NON-IMAGING

## 2022-10-29 MED ORDER — GEMTESA 75 MG PO TABS: 75.0000 mg | ORAL_TABLET | Freq: Every day | ORAL | 11 refills | Status: DC

## 2022-10-29 NOTE — Progress Notes (Signed)
10/29/2022 12:23 PM   Gerlene Burdock Alden Server Shearn 02/17/1925 [id number]  CC: Chief Complaint  Patient presents with   Follow-up   Over Active Bladder    6 week follow-up   HPI: Johnny Norman is a 89 y.o. male with PMH OAB wet with nocturia who previously failed Myrbetriq who presents today for symptom recheck on Gemtesa.  He is accompanied today by his son, who contributes to HPI.  Today he reports significant symptomatic improvement on Gemtesa.  His urinary leakage has completely resolved.  He is very pleased with his progress and wishes to continue the medication.  PVR 0 mL.  PMH: Past Medical History:  Diagnosis Date   A-fib (HCC)    CHF (congestive heart failure) (HCC)    Claudication (HCC)    leg   Hyperlipidemia    Peripheral vascular disease (HCC)    Presence of permanent cardiac pacemaker    Small bowel obstruction (HCC)    Thyroid disease     Surgical History: Past Surgical History:  Procedure Laterality Date   APPENDECTOMY     BOWEL RESECTION  10/12/2014   Procedure: SMALL BOWEL RESECTION;  Surgeon: Gladis Riffle, MD;  Location: [facility] OR[facility];  Service: General;;   CHOLECYSTECTOMY     COLONOSCOPY     INSERT / REPLACE / REMOVE PACEMAKER  09/13/2008   SSS s/p pacemaker implant; Medtronic pacemaker,.   LAPAROTOMY N/A 10/12/2014   Procedure: EXPLORATORY LAPAROTOMY;  Surgeon: Gladis Riffle, MD;  Location: [facility] OR[facility];  Service: General;  Laterality: N/A;   PACEMAKER INSERTION N/A 12/30/2017   Procedure: GENERATOR CHANGE OUT- DUAL CHAMBER;  Surgeon: Corky Downs, MD;  Location: [facility] OR[facility];  Service: Cardiovascular;  Laterality: N/A;   PROSTATE SURGERY      Home Medications:  Allergies as of 10/29/2022   No Known Allergies      Medication List        Accurate as of October 29, 2022 12:23 PM. If you have any questions, ask your nurse or doctor.          atorvastatin 10 MG tablet Commonly known as: LIPITOR TAKE ONE TABLET BY MOUTH EVERY DAY    Blood Pressure Monitor Kit 1 each by Does not apply route daily.   furosemide 20 MG tablet Commonly known as: LASIX TAKE ONE TABLET (20 MG) BY MOUTH EVERY DAY   Gemtesa 75 MG Tabs Generic drug: Vibegron Take 1 tablet (75 mg total) by mouth daily.   levothyroxine 88 MCG tablet Commonly known as: SYNTHROID TAKE 1 TABLET EVERY DAY ON EMPTY STOMACHWITH A GLASS OF WATER AT LEAST 30-60 MINBEFORE BREAKFAST   lisinopril 2.5 MG tablet Commonly known as: ZESTRIL TAKE 1 TABLET BY MOUTH DAILY   multivitamin tablet Take 1 tablet by mouth daily.   nadolol 20 MG tablet Commonly known as: CORGARD TAKE 1 TABLET BY MOUTH DAILY   warfarin 5 MG tablet Commonly known as: COUMADIN Take 1 tablet (5 mg total) by mouth daily.        Allergies:  No Known Allergies  Family History: Family History  Family history unknown: Yes    Social History:   reports that he has never smoked. He has never been exposed to tobacco smoke. He has never used smokeless tobacco. He reports that he does not drink alcohol and does not use drugs.  Physical Exam: BP 132/88   Pulse 85   Ht 6\' 4"  (1.93 m)   Wt 180 lb (81.6 kg)   BMI 21.91 kg/m  Constitutional:  Alert and oriented, no acute distress, nontoxic appearing HEENT: [place], AT, hard of hearing Cardiovascular: No clubbing, cyanosis, or edema Respiratory: Normal respiratory effort, no increased work of breathing Skin: No rashes, bruises or suspicious lesions Neurologic: Grossly intact, no focal deficits, moving all 4 extremities Psychiatric: Normal mood and affect  Laboratory Data: Results for orders placed or performed in visit on 10/29/22  BLADDER SCAN AMB NON-IMAGING  Result Value Ref Range   Scan Result 0ml    Assessment & Plan:   1. OAB (overactive bladder) Significant symptomatic improvement and OAB on Gemtesa.  He is emptying appropriately and now completely dry.  Will plan to continue this, may offer samples if needed if it is cost  prohibitive. - BLADDER SCAN AMB NON-IMAGING - Vibegron (GEMTESA) 75 MG TABS; Take 1 tablet (75 mg total) by mouth daily.  Dispense: 30 tablet; Refill: 11  Return in about 1 year (around 10/29/2023) for Annual OAB f/u with PVR.  Carman Ching, PA-C  [facility] [place] [address], [address] [place], [place] [address] [phone]

## 2022-11-19 DIAGNOSIS — I10 Essential (primary) hypertension: Secondary | ICD-10-CM | POA: Diagnosis not present

## 2022-11-19 DIAGNOSIS — E785 Hyperlipidemia, unspecified: Secondary | ICD-10-CM | POA: Diagnosis not present

## 2022-11-19 DIAGNOSIS — I4892 Unspecified atrial flutter: Secondary | ICD-10-CM | POA: Diagnosis not present

## 2022-11-19 DIAGNOSIS — E039 Hypothyroidism, unspecified: Secondary | ICD-10-CM | POA: Diagnosis not present

## 2022-11-19 DIAGNOSIS — N400 Enlarged prostate without lower urinary tract symptoms: Secondary | ICD-10-CM | POA: Diagnosis not present

## 2022-11-19 DIAGNOSIS — Z950 Presence of cardiac pacemaker: Secondary | ICD-10-CM | POA: Diagnosis not present

## 2022-11-19 DIAGNOSIS — I4891 Unspecified atrial fibrillation: Secondary | ICD-10-CM | POA: Diagnosis not present

## 2022-11-19 DIAGNOSIS — R609 Edema, unspecified: Secondary | ICD-10-CM | POA: Diagnosis not present

## 2022-11-28 ENCOUNTER — Other Ambulatory Visit: Payer: Self-pay

## 2022-11-28 ENCOUNTER — Emergency Department
Admission: EM | Admit: 2022-11-28 | Discharge: 2022-11-29 | Disposition: A | Discharge status: Home / Self Care | Payer: Medicare PPO | Attending: Emergency Medicine | Admitting: Emergency Medicine

## 2022-11-28 ENCOUNTER — Emergency Department: Payer: Medicare PPO

## 2022-11-28 DIAGNOSIS — R531 Weakness: Secondary | ICD-10-CM | POA: Insufficient documentation

## 2022-11-28 DIAGNOSIS — I509 Heart failure, unspecified: Secondary | ICD-10-CM | POA: Insufficient documentation

## 2022-11-28 DIAGNOSIS — R29818 Other symptoms and signs involving the nervous system: Secondary | ICD-10-CM | POA: Diagnosis not present

## 2022-11-28 DIAGNOSIS — N390 Urinary tract infection, site not specified: Secondary | ICD-10-CM | POA: Insufficient documentation

## 2022-11-28 DIAGNOSIS — I110 Hypertensive heart disease with heart failure: Secondary | ICD-10-CM | POA: Diagnosis not present

## 2022-11-28 DIAGNOSIS — R29898 Other symptoms and signs involving the musculoskeletal system: Secondary | ICD-10-CM

## 2022-11-28 LAB — BASIC METABOLIC PANEL
Anion gap: 7 (ref 5–15)
BUN: 34 mg/dL — ABNORMAL HIGH (ref 8–23)
CO2: 23 mmol/L (ref 22–32)
Calcium: 8.9 mg/dL (ref 8.9–10.3)
Chloride: 107 mmol/L (ref 98–111)
Creatinine, Ser: 1.69 mg/dL — ABNORMAL HIGH (ref 0.61–1.24)
GFR, Estimated: 37 mL/min — ABNORMAL LOW (ref >60)
Glucose, Bld: 112 mg/dL — ABNORMAL HIGH (ref 70–99)
Potassium: 4.8 mmol/L (ref 3.5–5.1)
Sodium: 137 mmol/L (ref 135–145)

## 2022-11-28 LAB — CBC
HCT: 44.6 % (ref 39.0–52.0)
Hemoglobin: 14.5 g/dL (ref 13.0–17.0)
MCH: 30.2 pg (ref 26.0–34.0)
MCHC: 32.5 g/dL (ref 30.0–36.0)
MCV: 92.9 fL (ref 80.0–100.0)
Platelets: 135 10*3/uL — ABNORMAL LOW (ref 150–400)
RBC: 4.8 MIL/uL (ref 4.22–5.81)
RDW: 14.6 % (ref 11.5–15.5)
WBC: 6.7 10*3/uL (ref 4.0–10.5)
nRBC: 0 % (ref 0.0–0.2)

## 2022-11-28 NOTE — ED Triage Notes (Signed)
Pt here for feeling "staggery." Started in the last hour. Denies pain or weakness in arms, denies H/a and dizziness, just generalized weakness in both legs.

## 2022-11-28 NOTE — ED Triage Notes (Signed)
First nurse note: Pt here from [facility] clinic for issues with "not feeling right" and new onset of hypertension.

## 2022-11-28 NOTE — ED Provider Notes (Signed)
-----------------------------------------   11:09 PM on 11/28/2022 -----------------------------------------  Assuming care from Dr. Vicente Males.  In short, Johnny Norman is a 89 y.o. male with a chief complaint of unstable gait, now resolved.  Refer to the original H&P for additional details.  The current plan of care is to follow up CT scan and UA.  Reassess.   Clinical Course as of 11/29/22 0323  Sat Nov 29, 2022  1610 Patient ambulatory, walking around the department and asking if he can go home.  I viewed and interpreted the patient's head CT and I see no evidence of acute abnormality.  Labs are all essentially normal other than kidney disease but this seems stable.  He is tolerating good oral intake.  He said he feels fine and no longer feels unsteady.  His urinalysis shows a probable UTI so I am treating empirically and discussed this with him and his son.  They will follow-up as an outpatient and I gave my usual and customary return precautions. [CF]    Clinical Course User Index [CF] Loleta Rose, MD     Medications  cephALEXin [facility]) capsule 500 mg (has no administration in time range)     ED Discharge Orders          Ordered    cephALEXin (KEFLEX) 500 MG capsule  3 times daily        11/29/22 0315           Final diagnoses:  Weakness of both lower extremities  Urinary tract infection without hematuria, site unspecified     Loleta Rose, MD 11/29/22 [phone]

## 2022-11-28 NOTE — ED Provider Notes (Signed)
[facility] Provider Note   Event Date/Time   First MD Initiated Contact with Patient 11/28/22 2225     (approximate) History  Weakness  HPI Johnny Norman is a 89 y.o. male with past medical history of hyperlipidemia, chronic atrial fibrillation, CHF, and thyroid disease who presents after an episode of bilateral lower extremity weakness after getting up from a seated position.  Patient initially presented to [facility] clinic who sent the patient to the emergency room due to issues with "not feeling right" and new onset of hypertension.  Patient states that this episode of bilateral lower extremity weakness lasted approximately 1 hour and resolved spontaneously.  Patient states he has had symptoms similar to this in the past when he has gotten up from a seated position or got up from laying down however the symptoms have never lasted this long.  Patient denies any complaints at this time ROS: Patient currently denies any vision changes, tinnitus, difficulty speaking, facial droop, sore throat, chest pain, shortness of breath, abdominal pain, nausea/vomiting/diarrhea, dysuria, or numbness/paresthesias in any extremity   Physical Exam  Triage Vital Signs: ED Triage Vitals  Encounter Vitals Group     BP 11/28/22 1846 (!) 162/105     Systolic BP Percentile --      Diastolic BP Percentile --      Pulse Rate 11/28/22 1846 73     Resp 11/28/22 1846 18     Temp 11/28/22 1846 (!) 97.5 F (36.4 C)     Temp Source 11/28/22 1846 Oral     SpO2 11/28/22 1846 99 %     Weight 11/28/22 1847 175 lb (79.4 kg)     Height 11/28/22 1847 6\' 4"  (1.93 m)     Head Circumference --      Peak Flow --      Pain Score 11/28/22 1855 0     Pain Loc --      Pain Education --      Exclude from Growth Chart --    Most recent vital signs: Vitals:   11/28/22 1846  BP: (!) 162/105  Pulse: 73  Resp: 18  Temp: (!) 97.5 F (36.4 C)  SpO2: 99%   General: Awake, oriented  x4. CV:  Good peripheral perfusion.  Resp:  Normal effort.  Abd:  No distention.  Other:  Elderly well-developed, well-nourished Caucasian male resting comfortably in no acute distress.  NIHSS 0 ED Results / Procedures / Treatments  Labs (all labs ordered are listed, but only abnormal results are displayed) Labs Reviewed  BASIC METABOLIC PANEL - Abnormal; Notable for the following components:      Result Value   Glucose, Bld 112 (*)    BUN 34 (*)    Creatinine, Ser 1.69 (*)    GFR, Estimated 37 (*)    All other components within normal limits  CBC - Abnormal; Notable for the following components:   Platelets 135 (*)    All other components within normal limits  URINALYSIS, ROUTINE W REFLEX MICROSCOPIC  CBG MONITORING, ED   EKG ED ECG REPORT I, Merwyn Katos, the attending physician, personally viewed and interpreted this ECG. Date: 11/28/2022 EKG Time: 1853 Rate: 71 Rhythm: normal sinus rhythm QRS Axis: normal Intervals: normal ST/T Wave abnormalities: normal Narrative Interpretation: Ventricular paced.  No evidence of acute ischemia RADIOLOGY ED MD interpretation: CT head pending -Agree with radiology assessment Official radiology report(s): No results found. PROCEDURES: Critical Care performed: No Procedures MEDICATIONS ORDERED IN  ED: Medications - No data to display IMPRESSION / MDM / ASSESSMENT AND PLAN / ED COURSE  I reviewed the triage vital signs and the nursing notes.                             The patient is on the cardiac monitor to evaluate for evidence of arrhythmia and/or significant heart rate changes. Patient's presentation is most consistent with acute presentation with potential threat to life or bodily function. You have been evaluated today for general weakness.  Patient's symptoms and work-up not consistent with a stroke and therefore they will be discharged from the ED.  Patient has currently been stabilized in the emergency  department.  Patient received an head CT that was pending at end of shift  Care of this patient will be signed out to the oncoming physician at the end of my shift.  All pertinent patient information conveyed and all questions answered.  All further care and disposition decisions will be made by the oncoming physician.   FINAL CLINICAL IMPRESSION(S) / ED DIAGNOSES   Final diagnoses:  Weakness of both lower extremities   Rx / DC Orders   ED Discharge Orders     None      Note:  This document was prepared using Dragon voice recognition software and may include unintentional dictation errors.   Merwyn Katos, MD 11/28/22 [phone]

## 2022-11-29 LAB — URINALYSIS, ROUTINE W REFLEX MICROSCOPIC
Bilirubin Urine: NEGATIVE
Glucose, UA: NEGATIVE mg/dL
Hgb urine dipstick: NEGATIVE
Ketones, ur: NEGATIVE mg/dL
Nitrite: NEGATIVE
Protein, ur: 30 mg/dL — AB
Specific Gravity, Urine: 1.023 (ref 1.005–1.030)
pH: 5 (ref 5.0–8.0)

## 2022-11-29 MED ORDER — CEPHALEXIN 500 MG PO CAPS: 500.0000 mg | ORAL_CAPSULE | Freq: Three times a day (TID) | ORAL | 0 refills | Status: AC

## 2022-11-29 MED ORDER — CEPHALEXIN 500 MG PO CAPS
500.0000 mg | ORAL_CAPSULE | Freq: Once | ORAL | Status: AC
  Administered 2022-11-29: 500 mg via ORAL
  Filled 2022-11-29: qty 1

## 2022-11-29 NOTE — ED Notes (Signed)
Pt declined DC VS

## 2022-11-29 NOTE — Discharge Instructions (Signed)
Your evaluation was generally reassuring, but it appears you have a urinary tract infection which could be making you feel unsteady.  Please take the full course of antibiotics as prescribed and follow-up with your primary care doctor at the next available opportunity.  Please continue on your regular medications as well.

## 2022-12-01 LAB — URINE CULTURE: Culture: 30000 — AB

## 2022-12-24 DIAGNOSIS — Z0000 Encounter for general adult medical examination without abnormal findings: Secondary | ICD-10-CM | POA: Diagnosis not present

## 2023-01-01 ENCOUNTER — Encounter: Payer: Self-pay | Admitting: Physician Assistant

## 2023-01-01 ENCOUNTER — Ambulatory Visit: Payer: Medicare PPO | Admitting: Physician Assistant

## 2023-01-01 VITALS — BP 141/91 | HR 85 | Ht 76.0 in | Wt 181.0 lb

## 2023-01-01 DIAGNOSIS — N3281 Overactive bladder: Secondary | ICD-10-CM

## 2023-01-01 DIAGNOSIS — R351 Nocturia: Secondary | ICD-10-CM | POA: Diagnosis not present

## 2023-01-01 LAB — MICROSCOPIC EXAMINATION

## 2023-01-01 LAB — URINALYSIS, COMPLETE
Bilirubin, UA: NEGATIVE
Glucose, UA: NEGATIVE
Ketones, UA: NEGATIVE
Leukocytes,UA: NEGATIVE
Nitrite, UA: NEGATIVE
Specific Gravity, UA: 1.02 (ref 1.005–1.030)
Urobilinogen, Ur: 1 mg/dL (ref 0.2–1.0)
pH, UA: 5.5 (ref 5.0–7.5)

## 2023-01-01 LAB — BLADDER SCAN AMB NON-IMAGING: Scan Result: 31

## 2023-01-01 MED ORDER — TROSPIUM CHLORIDE 20 MG PO TABS: 20.0000 mg | ORAL_TABLET | Freq: Every day | ORAL | 11 refills | Status: DC

## 2023-01-01 NOTE — Progress Notes (Signed)
01/01/2023 10:43 AM   Johnny Norman 28-Oct-1925 [id number]  CC: Chief Complaint  Patient presents with   Over Active Bladder   HPI: Johnny Norman is a 89 y.o. male with PMH OAB wet with nocturia who previously failed Myrbetriq now on Gemtesa who presents today to discuss nighttime symptoms.  He is accompanied today by his son, who contributes to HPI.  When I last saw him in clinic 2 months ago, he had complete resolution in urinary leakage on Gemtesa 75 mg daily.  Today they report that he has been having worsening urinary leakage, especially overnight.  He has been taking 2 Gemtesa daily and this resolves his symptoms.  Over the last month, they have noticed some issues with his blood pressure and wonder if this could be related.   He denies a history of constipation, dry mouth, or dry eye.  He has a cardiac pacemaker.  In-office UA today positive for trace intact blood and 1+ protein; urine microscopy pan negative. PVR 31mL.  PMH: Past Medical History:  Diagnosis Date   A-fib (HCC)    CHF (congestive heart failure) (HCC)    Claudication (HCC)    leg   Hyperlipidemia    Peripheral vascular disease (HCC)    Presence of permanent cardiac pacemaker    Small bowel obstruction (HCC)    Thyroid disease     Surgical History: Past Surgical History:  Procedure Laterality Date   APPENDECTOMY     BOWEL RESECTION  10/12/2014   Procedure: SMALL BOWEL RESECTION;  Surgeon: Gladis Riffle, MD;  Location: [facility] ORS;  Service: General;;   CHOLECYSTECTOMY     COLONOSCOPY     INSERT / REPLACE / REMOVE PACEMAKER  09/13/2008   SSS s/p pacemaker implant; Medtronic pacemaker,.   LAPAROTOMY N/A 10/12/2014   Procedure: EXPLORATORY LAPAROTOMY;  Surgeon: Gladis Riffle, MD;  Location: [facility] ORS;  Service: General;  Laterality: N/A;   PACEMAKER INSERTION N/A 12/30/2017   Procedure: GENERATOR CHANGE OUT- DUAL CHAMBER;  Surgeon: Corky Downs, MD;  Location: [facility] ORS;   Service: Cardiovascular;  Laterality: N/A;   PROSTATE SURGERY      Home Medications:  Allergies as of 01/01/2023   No Known Allergies      Medication List        Accurate as of January 01, 2023 10:43 AM. If you have any questions, ask your nurse or doctor.          atorvastatin 10 MG tablet Commonly known as: LIPITOR TAKE ONE TABLET BY MOUTH EVERY DAY   Blood Pressure Monitor Kit 1 each by Does not apply route daily.   furosemide 20 MG tablet Commonly known as: LASIX TAKE ONE TABLET (20 MG) BY MOUTH EVERY DAY   Gemtesa 75 MG Tabs Generic drug: Vibegron Take 1 tablet (75 mg total) by mouth daily.   levothyroxine 88 MCG tablet Commonly known as: SYNTHROID TAKE 1 TABLET EVERY DAY ON EMPTY STOMACHWITH A GLASS OF WATER AT LEAST 30-60 MINBEFORE BREAKFAST   lisinopril 2.5 MG tablet Commonly known as: ZESTRIL TAKE 1 TABLET BY MOUTH DAILY   multivitamin tablet Take 1 tablet by mouth daily.   nadolol 20 MG tablet Commonly known as: CORGARD TAKE 1 TABLET BY MOUTH DAILY   trospium 20 MG tablet Commonly known as: SANCTURA Take 1 tablet (20 mg total) by mouth at bedtime. Started by: Carman Ching   warfarin 5 MG tablet Commonly known as: COUMADIN Take 1 tablet (5 mg total) by mouth  daily.        Allergies:  No Known Allergies  Family History: Family History  Family history unknown: Yes    Social History:   reports that he has never smoked. He has never been exposed to tobacco smoke. He has never used smokeless tobacco. He reports that he does not drink alcohol and does not use drugs.  Physical Exam: BP (!) 141/91   Pulse 85   Ht 6\' 4"  (1.93 m)   Wt 181 lb (82.1 kg)   BMI 22.03 kg/m   Constitutional:  Alert and oriented, no acute distress, nontoxic appearing HEENT: [place], AT Cardiovascular: No clubbing, cyanosis, or edema Respiratory: Normal respiratory effort, no increased work of breathing Skin: No rashes, bruises or suspicious  lesions Neurologic: Grossly intact, no focal deficits, moving all 4 extremities Psychiatric: Normal mood and affect  Laboratory Data: Results for orders placed or performed in visit on 01/01/23  Microscopic Examination   Urine  Result Value Ref Range   WBC, UA 0-5 0 - 5 /hpf   RBC, Urine 0-2 0 - 2 /hpf   Epithelial Cells (non renal) 0-10 0 - 10 /hpf   Mucus, UA Present (A) Not Estab.   Bacteria, UA Few None seen/Few  Urinalysis, Complete  Result Value Ref Range   Specific Gravity, UA 1.020 1.005 - 1.030   pH, UA 5.5 5.0 - 7.5   Color, UA Yellow Yellow   Appearance Ur Clear Clear   Leukocytes,UA Negative Negative   Protein,UA 1+ (A) Negative/Trace   Glucose, UA Negative Negative   Ketones, UA Negative Negative   RBC, UA Trace (A) Negative   Bilirubin, UA Negative Negative   Urobilinogen, Ur 1.0 0.2 - 1.0 mg/dL   Nitrite, UA Negative Negative   Microscopic Examination See below:   Bladder Scan (Post Void Residual) in office  Result Value Ref Range   Scan Result 31    Assessment & Plan:   1. OAB (overactive bladder) No longer treatment goal on Gemtesa.  We discussed that the maximum dose is 1 capsule daily and he should not take more than this.  Will continue Gemtesa, since it seems to be helping to some degree.  We discussed options including augmenting with an additional medication versus consideration of PTNS.  I do not think he is a good candidate for Botox or InterStim given his age.  He would like to try additional pharmacotherapy, so we will augment with trospium 20 mg at bedtime and see him back in 6 weeks for symptom recheck and PVR.  If he is incompletely emptying or still not at treatment goal, will recommend pursuing PTNS instead at that time, however he will require cardiac clearance given his pacemaker. - Urinalysis, Complete - Bladder Scan (Post Void Residual) in office - trospium (SANCTURA) 20 MG tablet; Take 1 tablet (20 mg total) by mouth at bedtime.   Dispense: 30 tablet; Refill: 11  Return in about 6 weeks (around 02/12/2023) for Symptom recheck with PVR.  Carman Ching, PA-C  [facility] [place] [address] [place], [place] [address] [phone]

## 2023-01-07 DIAGNOSIS — Z950 Presence of cardiac pacemaker: Secondary | ICD-10-CM | POA: Diagnosis not present

## 2023-01-07 DIAGNOSIS — E785 Hyperlipidemia, unspecified: Secondary | ICD-10-CM | POA: Diagnosis not present

## 2023-01-07 DIAGNOSIS — I4892 Unspecified atrial flutter: Secondary | ICD-10-CM | POA: Diagnosis not present

## 2023-01-07 DIAGNOSIS — R609 Edema, unspecified: Secondary | ICD-10-CM | POA: Diagnosis not present

## 2023-01-07 DIAGNOSIS — I10 Essential (primary) hypertension: Secondary | ICD-10-CM | POA: Diagnosis not present

## 2023-01-07 DIAGNOSIS — I4891 Unspecified atrial fibrillation: Secondary | ICD-10-CM | POA: Diagnosis not present

## 2023-01-07 DIAGNOSIS — N400 Enlarged prostate without lower urinary tract symptoms: Secondary | ICD-10-CM | POA: Diagnosis not present

## 2023-01-07 DIAGNOSIS — E039 Hypothyroidism, unspecified: Secondary | ICD-10-CM | POA: Diagnosis not present

## 2023-01-30 DIAGNOSIS — I10 Essential (primary) hypertension: Secondary | ICD-10-CM | POA: Diagnosis not present

## 2023-01-30 DIAGNOSIS — N400 Enlarged prostate without lower urinary tract symptoms: Secondary | ICD-10-CM | POA: Diagnosis not present

## 2023-01-30 DIAGNOSIS — Z950 Presence of cardiac pacemaker: Secondary | ICD-10-CM | POA: Diagnosis not present

## 2023-01-30 DIAGNOSIS — J209 Acute bronchitis, unspecified: Secondary | ICD-10-CM | POA: Diagnosis not present

## 2023-01-30 DIAGNOSIS — I4892 Unspecified atrial flutter: Secondary | ICD-10-CM | POA: Diagnosis not present

## 2023-01-30 DIAGNOSIS — E785 Hyperlipidemia, unspecified: Secondary | ICD-10-CM | POA: Diagnosis not present

## 2023-01-30 DIAGNOSIS — I4891 Unspecified atrial fibrillation: Secondary | ICD-10-CM | POA: Diagnosis not present

## 2023-01-30 DIAGNOSIS — E039 Hypothyroidism, unspecified: Secondary | ICD-10-CM | POA: Diagnosis not present

## 2023-02-12 ENCOUNTER — Ambulatory Visit: Payer: Medicare PPO | Admitting: Physician Assistant

## 2023-02-12 VITALS — BP 133/89 | HR 82 | Ht 74.0 in | Wt 180.0 lb

## 2023-02-12 DIAGNOSIS — N3281 Overactive bladder: Secondary | ICD-10-CM | POA: Diagnosis not present

## 2023-02-12 DIAGNOSIS — R351 Nocturia: Secondary | ICD-10-CM | POA: Diagnosis not present

## 2023-02-12 LAB — BLADDER SCAN AMB NON-IMAGING: Scan Result: 117

## 2023-02-12 MED ORDER — TROSPIUM CHLORIDE 20 MG PO TABS: 20.0000 mg | ORAL_TABLET | Freq: Every day | ORAL | 11 refills | Status: DC

## 2023-02-12 MED ORDER — GEMTESA 75 MG PO TABS: 75.0000 mg | ORAL_TABLET | Freq: Every day | ORAL | 11 refills | Status: DC

## 2023-02-12 NOTE — Progress Notes (Signed)
02/12/2023 12:23 PM   Johnny Norman 04/03/1925 [id number]  CC: Chief Complaint  Patient presents with   Over Active Bladder   HPI: Johnny Norman is a 89 y.o. male with PMH OAB wet with nocturia who presents today for symptom recheck on Gemtesa and trospium.  He is accompanied today by his son, who contributes to HPI.   Today he reports that he is now completely dry on combination Gemtesa and trospium and is very pleased with his regimen.  He wishes to continue it.  They are taking Gemtesa at night and trospium in the morning.  PVR 117 mL.  PMH: Past Medical History:  Diagnosis Date   A-fib (HCC)    CHF (congestive heart failure) (HCC)    Claudication (HCC)    leg   Hyperlipidemia    Peripheral vascular disease (HCC)    Presence of permanent cardiac pacemaker    Small bowel obstruction (HCC)    Thyroid disease     Surgical History: Past Surgical History:  Procedure Laterality Date   APPENDECTOMY     BOWEL RESECTION  10/12/2014   Procedure: SMALL BOWEL RESECTION;  Surgeon: Gladis Riffle, MD;  Location: [facility];  Service: General;;   CHOLECYSTECTOMY     COLONOSCOPY     INSERT / REPLACE / REMOVE PACEMAKER  09/13/2008   SSS s/p pacemaker implant; Medtronic pacemaker,.   LAPAROTOMY N/A 10/12/2014   Procedure: EXPLORATORY LAPAROTOMY;  Surgeon: Gladis Riffle, MD;  Location: [facility];  Service: General;  Laterality: N/A;   PACEMAKER INSERTION N/A 12/30/2017   Procedure: GENERATOR CHANGE OUT- DUAL CHAMBER;  Surgeon: Corky Downs, MD;  Location: [facility] ORS;  Service: Cardiovascular;  Laterality: N/A;   PROSTATE SURGERY      Home Medications:  Allergies as of 02/12/2023   No Known Allergies      Medication List        Accurate as of February 12, 2023 12:23 PM. If you have any questions, ask your nurse or doctor.          atorvastatin 10 MG tablet Commonly known as: LIPITOR TAKE ONE TABLET BY MOUTH EVERY DAY   Blood Pressure Monitor  Kit 1 each by Does not apply route daily.   furosemide 20 MG tablet Commonly known as: LASIX TAKE ONE TABLET (20 MG) BY MOUTH EVERY DAY   Gemtesa 75 MG Tabs Generic drug: Vibegron Take 1 tablet (75 mg total) by mouth daily. Take in the evening. What changed: additional instructions   levothyroxine 88 MCG tablet Commonly known as: SYNTHROID TAKE 1 TABLET EVERY DAY ON EMPTY STOMACHWITH A GLASS OF WATER AT LEAST 30-60 MINBEFORE BREAKFAST   lisinopril 2.5 MG tablet Commonly known as: ZESTRIL TAKE 1 TABLET BY MOUTH DAILY   multivitamin tablet Take 1 tablet by mouth daily.   nadolol 20 MG tablet Commonly known as: CORGARD TAKE 1 TABLET BY MOUTH DAILY   trospium 20 MG tablet Commonly known as: SANCTURA Take 1 tablet (20 mg total) by mouth daily. Take in the morning. What changed:  when to take this additional instructions   warfarin 5 MG tablet Commonly known as: COUMADIN Take 1 tablet (5 mg total) by mouth daily.        Allergies:  No Known Allergies  Family History: Family History  Family history unknown: Yes    Social History:   reports that he has never smoked. He has never been exposed to tobacco smoke. He has never used smokeless tobacco. He  reports that he does not drink alcohol and does not use drugs.  Physical Exam: BP 133/89   Pulse 82   Ht 6\' 2"  (1.88 m)   Wt 180 lb (81.6 kg)   BMI 23.11 kg/m   Constitutional:  Alert and oriented, no acute distress, nontoxic appearing HEENT: [place], AT Cardiovascular: No clubbing, cyanosis, or edema Respiratory: Normal respiratory effort, no increased work of breathing Skin: No rashes, bruises or suspicious lesions Neurologic: Grossly intact, no focal deficits, moving all 4 extremities Psychiatric: Normal mood and affect  Laboratory Data: Results for orders placed or performed in visit on 02/12/23  Bladder Scan (Post Void Residual) in office   Collection Time: 02/12/23 11:37 AM  Result Value Ref Range   Scan  Result 117    Assessment & Plan:   1. OAB (overactive bladder) (Primary) At treatment goal, emptying appropriately.  Will continue Gemtesa and trospium and see him back next year or sooner if needed. - Bladder Scan (Post Void Residual) in office - Vibegron (GEMTESA) 75 MG TABS; Take 1 tablet (75 mg total) by mouth daily. Take in the evening.  Dispense: 30 tablet; Refill: 11 - trospium (SANCTURA) 20 MG tablet; Take 1 tablet (20 mg total) by mouth daily. Take in the morning.  Dispense: 30 tablet; Refill: 11   Return in about 1 year (around 02/12/2024) for Annual OAB f/u with PVR.  Carman Ching, PA-C  [facility] [place] [address] [place], [place] [address] [phone]

## 2023-03-11 DIAGNOSIS — I4892 Unspecified atrial flutter: Secondary | ICD-10-CM | POA: Diagnosis not present

## 2023-03-11 DIAGNOSIS — N400 Enlarged prostate without lower urinary tract symptoms: Secondary | ICD-10-CM | POA: Diagnosis not present

## 2023-03-11 DIAGNOSIS — I4891 Unspecified atrial fibrillation: Secondary | ICD-10-CM | POA: Diagnosis not present

## 2023-03-11 DIAGNOSIS — S91114A Laceration without foreign body of right lesser toe(s) without damage to nail, initial encounter: Secondary | ICD-10-CM | POA: Diagnosis not present

## 2023-03-11 DIAGNOSIS — Z950 Presence of cardiac pacemaker: Secondary | ICD-10-CM | POA: Diagnosis not present

## 2023-03-11 DIAGNOSIS — E785 Hyperlipidemia, unspecified: Secondary | ICD-10-CM | POA: Diagnosis not present

## 2023-03-19 ENCOUNTER — Other Ambulatory Visit: Payer: Self-pay | Admitting: Physician Assistant

## 2023-03-19 DIAGNOSIS — N3281 Overactive bladder: Secondary | ICD-10-CM

## 2023-03-20 ENCOUNTER — Encounter: Payer: Self-pay | Admitting: Podiatry

## 2023-03-20 ENCOUNTER — Ambulatory Visit: Payer: Medicare PPO | Admitting: Podiatry

## 2023-03-20 DIAGNOSIS — M79675 Pain in left toe(s): Secondary | ICD-10-CM | POA: Diagnosis not present

## 2023-03-20 DIAGNOSIS — B351 Tinea unguium: Secondary | ICD-10-CM | POA: Diagnosis not present

## 2023-03-20 DIAGNOSIS — M79674 Pain in right toe(s): Secondary | ICD-10-CM | POA: Diagnosis not present

## 2023-03-20 NOTE — Progress Notes (Signed)
   Chief Complaint  Patient presents with   Nail Problem    "My toenails"  Son stated, "He has a couple of wounds that need to be checked." N - toenails L - 1-5 bilateral D - long time O - gradually worse C - thick, long A - none T - none    SUBJECTIVE Patient presents to office today complaining of elongated, thickened nails that cause pain while ambulating in shoes.  Patient is unable to trim their own nails. Patient is here for further evaluation and treatment.  Past Medical History:  Diagnosis Date   A-fib (HCC)    CHF (congestive heart failure) (HCC)    Claudication (HCC)    leg   Hyperlipidemia    Peripheral vascular disease (HCC)    Presence of permanent cardiac pacemaker    Small bowel obstruction (HCC)    Thyroid disease     No Known Allergies   OBJECTIVE General Patient is awake, alert, and oriented x 3 and in no acute distress. Derm Skin is dry and supple bilateral. Negative open lesions or macerations. Remaining integument unremarkable. Nails are tender, long, thickened and dystrophic with subungual debris, consistent with onychomycosis, 1-5 bilateral. No signs of infection noted. Vasc  DP and PT pedal pulses palpable bilaterally. Temperature gradient within normal limits.  Neuro Epicritic and protective threshold sensation grossly intact bilaterally.  Musculoskeletal Exam No symptomatic pedal deformities noted bilateral. Muscular strength within normal limits.  ASSESSMENT 1.  Pain due to onychomycosis of toenails both  PLAN OF CARE 1. Patient evaluated today.  2. Instructed to maintain good pedal hygiene and foot care.  3. Mechanical debridement of nails 1-5 bilaterally performed using a nail nipper. Filed with dremel without incident.  4. Return to clinic in 4 months routine footcare   Felecia Shelling, DPM [facility]  Dr. Felecia Shelling, DPM    [address] [place], [place] [address]                 Office [phone]  Fax [phone]

## 2023-03-31 DIAGNOSIS — H903 Sensorineural hearing loss, bilateral: Secondary | ICD-10-CM | POA: Diagnosis not present

## 2023-04-01 DIAGNOSIS — H6123 Impacted cerumen, bilateral: Secondary | ICD-10-CM | POA: Diagnosis not present

## 2023-04-01 DIAGNOSIS — H903 Sensorineural hearing loss, bilateral: Secondary | ICD-10-CM | POA: Diagnosis not present

## 2023-06-16 ENCOUNTER — Ambulatory Visit: Admitting: Nurse Practitioner

## 2023-06-16 ENCOUNTER — Encounter: Payer: Self-pay | Admitting: Nurse Practitioner

## 2023-06-16 VITALS — BP 110/86 | HR 86 | Temp 98.3°F | Resp 16 | Ht 74.0 in | Wt 174.6 lb

## 2023-06-16 DIAGNOSIS — N138 Other obstructive and reflux uropathy: Secondary | ICD-10-CM

## 2023-06-16 DIAGNOSIS — Z950 Presence of cardiac pacemaker: Secondary | ICD-10-CM | POA: Diagnosis not present

## 2023-06-16 DIAGNOSIS — E063 Autoimmune thyroiditis: Secondary | ICD-10-CM | POA: Diagnosis not present

## 2023-06-16 DIAGNOSIS — I4811 Longstanding persistent atrial fibrillation: Secondary | ICD-10-CM | POA: Diagnosis not present

## 2023-06-16 DIAGNOSIS — N3281 Overactive bladder: Secondary | ICD-10-CM | POA: Diagnosis not present

## 2023-06-16 DIAGNOSIS — N1832 Chronic kidney disease, stage 3b: Secondary | ICD-10-CM | POA: Diagnosis not present

## 2023-06-16 DIAGNOSIS — N401 Enlarged prostate with lower urinary tract symptoms: Secondary | ICD-10-CM | POA: Diagnosis not present

## 2023-06-16 DIAGNOSIS — D229 Melanocytic nevi, unspecified: Secondary | ICD-10-CM

## 2023-06-16 DIAGNOSIS — E782 Mixed hyperlipidemia: Secondary | ICD-10-CM

## 2023-06-16 DIAGNOSIS — I10 Essential (primary) hypertension: Secondary | ICD-10-CM

## 2023-06-16 LAB — POCT INR
INR: 2.4 (ref 2.0–3.0)
Prothrombin Time: 28.9

## 2023-06-16 MED ORDER — LEVOTHYROXINE SODIUM 88 MCG PO TABS: 88.0000 ug | ORAL_TABLET | Freq: Every day | ORAL | 3 refills | Status: AC

## 2023-06-16 MED ORDER — WARFARIN SODIUM 5 MG PO TABS: ORAL_TABLET | ORAL | 1 refills | Status: DC

## 2023-06-16 MED ORDER — NADOLOL 20 MG PO TABS: 20.0000 mg | ORAL_TABLET | Freq: Every day | ORAL | 3 refills | Status: AC

## 2023-06-16 MED ORDER — ATORVASTATIN CALCIUM 10 MG PO TABS: 10.0000 mg | ORAL_TABLET | Freq: Every day | ORAL | 3 refills | Status: AC

## 2023-06-16 MED ORDER — FUROSEMIDE 20 MG PO TABS: 20.0000 mg | ORAL_TABLET | Freq: Every day | ORAL | 3 refills | Status: DC

## 2023-06-16 MED ORDER — LISINOPRIL 2.5 MG PO TABS: 2.5000 mg | ORAL_TABLET | Freq: Every day | ORAL | 1 refills | Status: DC

## 2023-06-16 NOTE — Progress Notes (Signed)
 [facility] [address] [place], [place] [address]  Internal MEDICINE  Office Visit Note  Patient Name: Johnny Norman  [id number]  [id number]  Date of Service: 06/16/2023   Complaints/HPI Pt is here for establishment of PCP. Chief Complaint  Patient presents with   New Patient (Initial Visit)    Inr checked     HPI Deylan presents for a new patient visit to establish care.  Well-appearing 89 y.o. male with high cholesterol, AFIB, SSS, permanent pacemaker, hypothyroidism, hypertension, BPH, overactive bladder and some urinary incontinence.  Work: retired  Home: lives at home alone but son is there most of the time.  Diet: healthy diet, getting more picky about what he eats.  Exercise: not regularly  Tobacco use: none  Alcohol use: none  Illicit drug use: none  Routine CRC screening: discontinued  Labs:  due for routine labs  New or worsening pain: none  Has a permanent pacemaker due to AFIB and SSS.  Has decreased kidney function on prior labs-- CKD stage 3b Sees urology for BPH and overactive bladder -- takes gemtesa  and trospium .  INR check today -- has been 8 weeks. INR is in therapeutic range at 2.4. He takes warfarin 5 mg on Saturdays and Sundays and he takes 2.5 mg of warfarin daily on Monday through Friday.   Current Medication: Outpatient Encounter Medications as of 06/16/2023  Medication Sig   Blood Pressure Monitor KIT 1 each by Does not apply route daily.   Multiple Vitamin (MULTIVITAMIN) tablet Take 1 tablet by mouth daily.   trospium  (SANCTURA ) 20 MG tablet Take 1 tablet (20 mg total) by mouth daily. Take in the morning.   Vibegron  (GEMTESA ) 75 MG TABS Take 1 tablet (75 mg total) by mouth daily. Take in the evening.   [DISCONTINUED] atorvastatin  (LIPITOR) 10 MG tablet TAKE ONE TABLET BY MOUTH EVERY DAY   [DISCONTINUED] furosemide  (LASIX ) 20 MG tablet TAKE ONE TABLET (20 MG) BY MOUTH EVERY DAY   [DISCONTINUED] levothyroxine  (SYNTHROID ) 88  MCG tablet TAKE 1 TABLET EVERY DAY ON EMPTY STOMACHWITH A GLASS OF WATER AT LEAST 30-60 MINBEFORE BREAKFAST   [DISCONTINUED] lisinopril  (ZESTRIL ) 2.5 MG tablet TAKE 1 TABLET BY MOUTH DAILY   [DISCONTINUED] nadolol  (CORGARD ) 20 MG tablet TAKE 1 TABLET BY MOUTH DAILY   [DISCONTINUED] warfarin (COUMADIN ) 5 MG tablet Take 1 tablet (5 mg total) by mouth daily.   atorvastatin  (LIPITOR) 10 MG tablet Take 1 tablet (10 mg total) by mouth daily.   furosemide  (LASIX ) 20 MG tablet Take 1 tablet (20 mg total) by mouth daily.   levothyroxine  (SYNTHROID ) 88 MCG tablet Take 1 tablet (88 mcg total) by mouth daily before breakfast.   lisinopril  (ZESTRIL ) 2.5 MG tablet Take 1 tablet (2.5 mg total) by mouth daily.   nadolol  (CORGARD ) 20 MG tablet Take 1 tablet (20 mg total) by mouth daily.   warfarin (COUMADIN ) 5 MG tablet Take 5 mg by mouth on Saturday and Sunday, and take 2.5 mg by mouth daily on Monday, Tuesday, Wednesday, Thursday and Friday.   No facility-administered encounter medications on file as of 06/16/2023.    Surgical History: Past Surgical History:  Procedure Laterality Date   APPENDECTOMY     BOWEL RESECTION  10/12/2014   Procedure: SMALL BOWEL RESECTION;  Surgeon: Kandis Ormond, MD;  Location: [facility] ORS;  Service: General;;   CHOLECYSTECTOMY     COLONOSCOPY     INSERT / REPLACE / REMOVE PACEMAKER  09/13/2008   SSS s/p pacemaker  implant; Medtronic pacemaker,.   LAPAROTOMY N/A 10/12/2014   Procedure: EXPLORATORY LAPAROTOMY;  Surgeon: Kandis Ormond, MD;  Location: [facility] ORS;  Service: General;  Laterality: N/A;   PACEMAKER INSERTION N/A 12/30/2017   Procedure: GENERATOR CHANGE OUT- DUAL CHAMBER;  Surgeon: Theron Flavin, MD;  Location: [facility] ORS;  Service: Cardiovascular;  Laterality: N/A;   PROSTATE SURGERY      Medical History: Past Medical History:  Diagnosis Date   A-fib (HCC)    CHF (congestive heart failure) (HCC)    Claudication (HCC)    leg   Hyperlipidemia    Peripheral  vascular disease (HCC)    Presence of permanent cardiac pacemaker    Small bowel obstruction (HCC)    Thyroid  disease     Family History: Family History  Family history unknown: Yes    Social History   Socioeconomic History   Marital status: Widowed    Spouse name: Not on file   Number of children: Not on file   Years of education: Not on file   Highest education level: Not on file  Occupational History   Not on file  Tobacco Use   Smoking status: Never    Passive exposure: Never   Smokeless tobacco: Never  Vaping Use   Vaping status: Never Used  Substance and Sexual Activity   Alcohol use: No    Comment: occasional   Drug use: No   Sexual activity: Yes    Birth control/protection: None  Other Topics Concern   Not on file  Social History Narrative   Not on file   Social Drivers of Health   Financial Resource Strain: Not on file  Food Insecurity: Not on file  Transportation Needs: Not on file  Physical Activity: Not on file  Stress: Not on file  Social Connections: Not on file  Intimate Partner Violence: Not on file     Review of Systems  Constitutional:  Negative for chills, fatigue and unexpected weight change.  HENT:  Negative for congestion, postnasal drip, rhinorrhea, sneezing and sore throat.   Eyes:  Negative for redness.  Respiratory: Negative.  Negative for cough, chest tightness, shortness of breath and wheezing.   Cardiovascular: Negative.  Negative for chest pain and palpitations.  Gastrointestinal:  Negative for abdominal pain, constipation, diarrhea, nausea and vomiting.  Genitourinary:  Positive for difficulty urinating and frequency. Negative for dysuria.  Musculoskeletal:  Negative for arthralgias, back pain, joint swelling and neck pain.  Skin:  Negative for rash.  Neurological: Negative.  Negative for tremors and numbness.  Hematological:  Negative for adenopathy. Does not bruise/bleed easily.  Psychiatric/Behavioral:  Negative for  behavioral problems (Depression), sleep disturbance and suicidal ideas. The patient is not nervous/anxious.     Vital Signs: BP 110/86   Pulse 86   Temp 98.3 F (36.8 C)   Resp 16   Ht 6\' 2"  (1.88 m)   Wt 174 lb 9.6 oz (79.2 kg)   SpO2 97%   BMI 22.42 kg/m    Physical Exam Vitals reviewed.  Constitutional:      General: He is not in acute distress.    Appearance: Normal appearance. He is normal weight. He is not ill-appearing.  HENT:     Head: Normocephalic and atraumatic.  Eyes:     Pupils: Pupils are equal, round, and reactive to light.  Cardiovascular:     Rate and Rhythm: Normal rate and regular rhythm.     Heart sounds: Normal heart sounds. No murmur heard. Pulmonary:  Effort: Pulmonary effort is normal. No respiratory distress.     Breath sounds: Normal breath sounds. No wheezing.  Skin:    General: Skin is warm and dry.     Capillary Refill: Capillary refill takes less than 2 seconds.  Neurological:     Mental Status: He is alert and oriented to person, place, and time.  Psychiatric:        Mood and Affect: Mood normal.        Behavior: Behavior normal.       Assessment/Plan: 1. Longstanding persistent atrial fibrillation (HCC) (Primary) Referred to cardiology, continue warfarin and nadolol  as prescribed.  - Ambulatory referral to Cardiology - warfarin (COUMADIN ) 5 MG tablet; Take 5 mg by mouth on Saturday and Sunday, and take 2.5 mg by mouth daily on Monday, Tuesday, Wednesday, Thursday and Friday.  Dispense: 90 tablet; Refill: 1 - nadolol  (CORGARD ) 20 MG tablet; Take 1 tablet (20 mg total) by mouth daily.  Dispense: 90 tablet; Refill: 3  2. Essential hypertension Stable, continue nadolol , lisinopril  and furosemide  as prescribed.  - nadolol  (CORGARD ) 20 MG tablet; Take 1 tablet (20 mg total) by mouth daily.  Dispense: 90 tablet; Refill: 3 - lisinopril  (ZESTRIL ) 2.5 MG tablet; Take 1 tablet (2.5 mg total) by mouth daily.  Dispense: 90 tablet; Refill:  1 - furosemide  (LASIX ) 20 MG tablet; Take 1 tablet (20 mg total) by mouth daily.  Dispense: 30 tablet; Refill: 3  3. Stage 3b chronic kidney disease (HCC) Routine labs ordered  - CBC with Differential/Platelet - CMP14+EGFR - Lipid Profile  4. Hypothyroidism due to Hashimoto thyroiditis Routine labs ordered. Continue levothyroxine  as prescribed.  - CBC with Differential/Platelet - CMP14+EGFR - Lipid Profile - levothyroxine  (SYNTHROID ) 88 MCG tablet; Take 1 tablet (88 mcg total) by mouth daily before breakfast.  Dispense: 90 tablet; Refill: 3 - TSH + free T4  5. Mixed hyperlipidemia Continue atorvastatin  as prescribed. Routine labs ordered.  - CBC with Differential/Platelet - CMP14+EGFR - Lipid Profile - atorvastatin  (LIPITOR) 10 MG tablet; Take 1 tablet (10 mg total) by mouth daily.  Dispense: 90 tablet; Refill: 3  6. Benign prostatic hyperplasia with urinary obstruction Continue trospium  as prescribed. Sees urology  7. Overactive bladder Sees urology, continue gemtesa  as prescribed.   8. Multiple atypical skin moles Referred to dermatology - Ambulatory referral to Dermatology  9. Presence of permanent cardiac pacemaker Referred to cardiology. INR checked, in therapeutic range. Continue warfarin as prescribed.  - Ambulatory referral to Cardiology - POCT INR    General Counseling: Keyler verbalizes understanding of the findings of todays visit and agrees with plan of treatment. I have discussed any further diagnostic evaluation that may be needed or ordered today. We also reviewed his medications today. he has been encouraged to call the office with any questions or concerns that should arise related to todays visit.    Orders Placed This Encounter  Procedures   CBC with Differential/Platelet   CMP14+EGFR   Lipid Profile   TSH + free T4   Ambulatory referral to Cardiology   Ambulatory referral to Dermatology    Meds ordered this encounter  Medications    warfarin (COUMADIN ) 5 MG tablet    Sig: Take 5 mg by mouth on Saturday and Sunday, and take 2.5 mg by mouth daily on Monday, Tuesday, Wednesday, Thursday and Friday.    Dispense:  90 tablet    Refill:  1   nadolol  (CORGARD ) 20 MG tablet    Sig: Take 1 tablet (20  mg total) by mouth daily.    Dispense:  90 tablet    Refill:  3    PT IS REQUESTING REFILLS. PLEASE SEND NEW RX IF ALLOWED   levothyroxine  (SYNTHROID ) 88 MCG tablet    Sig: Take 1 tablet (88 mcg total) by mouth daily before breakfast.    Dispense:  90 tablet    Refill:  3    PATIENT IS REQUESTING A REFILL THANK YOU   lisinopril  (ZESTRIL ) 2.5 MG tablet    Sig: Take 1 tablet (2.5 mg total) by mouth daily.    Dispense:  90 tablet    Refill:  1   furosemide  (LASIX ) 20 MG tablet    Sig: Take 1 tablet (20 mg total) by mouth daily.    Dispense:  30 tablet    Refill:  3   atorvastatin  (LIPITOR) 10 MG tablet    Sig: Take 1 tablet (10 mg total) by mouth daily.    Dispense:  90 tablet    Refill:  3    Return in about 1 month (around 07/17/2023) for AWV, Kydan Shanholtzer PCP, and review labs and check INR in same visit. .  Time spent:30 Minutes Time spent with patient included reviewing progress notes, labs, imaging studies, and discussing plan for follow up.   [place] Controlled Substance Database was reviewed by me for overdose risk score (ORS)   This patient was seen by Laurence Pons, FNP-C in collaboration with Dr. Verneta Gone as a part of collaborative care agreement.   Dwight Adamczak R. Bobbi Burow, MSN, FNP-C Internal Medicine

## 2023-06-17 ENCOUNTER — Telehealth: Payer: Self-pay | Admitting: Nurse Practitioner

## 2023-06-17 NOTE — Telephone Encounter (Signed)
 Awaiting 06/16/23 office notes for Dermatology referral-Johnny Norman

## 2023-06-27 ENCOUNTER — Encounter: Payer: Self-pay | Admitting: Nurse Practitioner

## 2023-06-30 ENCOUNTER — Telehealth: Payer: Self-pay | Admitting: Nurse Practitioner

## 2023-06-30 NOTE — Telephone Encounter (Signed)
Dermatology referral sent via [facility] to [facility]. Notified patient. Gave pt telephone # [phone]

## 2023-07-17 ENCOUNTER — Ambulatory Visit (INDEPENDENT_AMBULATORY_CARE_PROVIDER_SITE_OTHER): Payer: Medicare PPO | Admitting: Podiatry

## 2023-07-17 ENCOUNTER — Encounter: Payer: Self-pay | Admitting: Podiatry

## 2023-07-17 VITALS — Ht 74.0 in | Wt 174.6 lb

## 2023-07-17 DIAGNOSIS — B351 Tinea unguium: Secondary | ICD-10-CM

## 2023-07-17 DIAGNOSIS — M79674 Pain in right toe(s): Secondary | ICD-10-CM | POA: Diagnosis not present

## 2023-07-17 DIAGNOSIS — E063 Autoimmune thyroiditis: Secondary | ICD-10-CM | POA: Diagnosis not present

## 2023-07-17 DIAGNOSIS — M79675 Pain in left toe(s): Secondary | ICD-10-CM | POA: Diagnosis not present

## 2023-07-17 DIAGNOSIS — E782 Mixed hyperlipidemia: Secondary | ICD-10-CM | POA: Diagnosis not present

## 2023-07-17 DIAGNOSIS — N1832 Chronic kidney disease, stage 3b: Secondary | ICD-10-CM | POA: Diagnosis not present

## 2023-07-18 LAB — CBC WITH DIFFERENTIAL/PLATELET
Basophils Absolute: 0 10*3/uL (ref 0.0–0.2)
Basos: 1 %
EOS (ABSOLUTE): 0.1 10*3/uL (ref 0.0–0.4)
Eos: 2 %
Hematocrit: 45.8 % (ref 37.5–51.0)
Hemoglobin: 14.7 g/dL (ref 13.0–17.7)
Immature Grans (Abs): 0 10*3/uL (ref 0.0–0.1)
Immature Granulocytes: 0 %
Lymphocytes Absolute: 1 10*3/uL (ref 0.7–3.1)
Lymphs: 16 %
MCH: 30.3 pg (ref 26.6–33.0)
MCHC: 32.1 g/dL (ref 31.5–35.7)
MCV: 94 fL (ref 79–97)
Monocytes Absolute: 0.6 10*3/uL (ref 0.1–0.9)
Monocytes: 9 %
Neutrophils Absolute: 4.5 10*3/uL (ref 1.4–7.0)
Neutrophils: 72 %
Platelets: 143 10*3/uL — ABNORMAL LOW (ref 150–450)
RBC: 4.85 x10E6/uL (ref 4.14–5.80)
RDW: 12.6 % (ref 11.6–15.4)
WBC: 6.2 10*3/uL (ref 3.4–10.8)

## 2023-07-18 LAB — CMP14+EGFR
ALT: 26 IU/L (ref 0–44)
AST: 37 IU/L (ref 0–40)
Albumin: 4.2 g/dL (ref 3.6–4.6)
Alkaline Phosphatase: 148 IU/L — ABNORMAL HIGH (ref 44–121)
BUN/Creatinine Ratio: 17 (ref 10–24)
BUN: 28 mg/dL (ref 10–36)
Bilirubin Total: 2.3 mg/dL — ABNORMAL HIGH (ref 0.0–1.2)
CO2: 22 mmol/L (ref 20–29)
Calcium: 9.3 mg/dL (ref 8.6–10.2)
Chloride: 104 mmol/L (ref 96–106)
Creatinine, Ser: 1.62 mg/dL — ABNORMAL HIGH (ref 0.76–1.27)
Globulin, Total: 2.5 g/dL (ref 1.5–4.5)
Glucose: 97 mg/dL (ref 70–99)
Potassium: 4.4 mmol/L (ref 3.5–5.2)
Sodium: 139 mmol/L (ref 134–144)
Total Protein: 6.7 g/dL (ref 6.0–8.5)
eGFR: 39 mL/min/{1.73_m2} — ABNORMAL LOW (ref >59)

## 2023-07-18 LAB — LIPID PANEL
Chol/HDL Ratio: 2.4 ratio (ref 0.0–5.0)
Cholesterol, Total: 105 mg/dL (ref 100–199)
HDL: 43 mg/dL (ref >39)
LDL Chol Calc (NIH): 47 mg/dL (ref 0–99)
Triglycerides: 74 mg/dL (ref 0–149)
VLDL Cholesterol Cal: 15 mg/dL (ref 5–40)

## 2023-07-18 LAB — TSH+FREE T4
Free T4: 1.45 ng/dL (ref 0.82–1.77)
TSH: 1.73 u[IU]/mL (ref 0.450–4.500)

## 2023-07-21 ENCOUNTER — Ambulatory Visit: Admitting: Nurse Practitioner

## 2023-07-21 ENCOUNTER — Encounter: Payer: Self-pay | Admitting: Nurse Practitioner

## 2023-07-21 ENCOUNTER — Encounter: Payer: Self-pay | Admitting: Podiatry

## 2023-07-21 VITALS — BP 126/78 | HR 75 | Temp 98.1°F | Resp 16 | Ht 74.0 in | Wt 182.2 lb

## 2023-07-21 DIAGNOSIS — E782 Mixed hyperlipidemia: Secondary | ICD-10-CM

## 2023-07-21 DIAGNOSIS — N1832 Chronic kidney disease, stage 3b: Secondary | ICD-10-CM

## 2023-07-21 DIAGNOSIS — E063 Autoimmune thyroiditis: Secondary | ICD-10-CM | POA: Diagnosis not present

## 2023-07-21 DIAGNOSIS — I4811 Longstanding persistent atrial fibrillation: Secondary | ICD-10-CM | POA: Diagnosis not present

## 2023-07-21 DIAGNOSIS — I10 Essential (primary) hypertension: Secondary | ICD-10-CM | POA: Diagnosis not present

## 2023-07-21 LAB — POCT INR
INR: 4.6 — AB (ref 2.0–3.0)
Prothrombin Time: 55.7

## 2023-07-21 NOTE — Progress Notes (Signed)
  Subjective:  Patient ID: Johnny Norman, male    DOB: 02-02-1925,  MRN: [id number]  89 y.o. male presents painful thick toenails that are difficult to trim. Pain interferes with ambulation. Aggravating factors include wearing enclosed shoe gear. Pain is relieved with periodic professional debridement.  Chief Complaint  Patient presents with   Nail Problem    Pt is here for [facility] PCP is Dr Liana and LOV was in May.    New problem(s): None   PCP is Liana Fish, NP. No Known Allergies  Review of Systems: Negative except as noted in the HPI.   Objective:  Johnny Norman is a pleasant 89 y.o. male in NAD. AAO x 3.  Vascular Examination: Vascular status intact b/l with palpable pedal pulses. CFT immediate b/l. Pedal hair present. No edema. No pain with calf compression b/l. Skin temperature gradient WNL b/l. No varicosities noted. No cyanosis or clubbing noted.  Neurological Examination: Sensation grossly intact b/l with 10 gram monofilament. Vibratory sensation intact b/l.  Dermatological Examination: Pedal skin with normal turgor, texture and tone b/l. No open wounds nor interdigital macerations noted. Toenails 1-5 b/l thick, discolored, elongated with subungual debris and pain on dorsal palpation. No hyperkeratotic lesions noted b/l.   Musculoskeletal Examination: Muscle strength 5/5 to b/l LE.  No pain, crepitus noted b/l. No gross pedal deformities. Patient ambulates independently without assistive aids.   Radiographs: None  Last A1c:       No data to display           Assessment:   1. Pain due to onychomycosis of toenails of both feet    Plan:  Patient was evaluated and treated. All patient's and/or POA's questions/concerns addressed on today's visit. Toenails 1-5 debrided in length and girth without incident. Continue soft, supportive shoe gear daily. Report any pedal injuries to medical professional. Call office if there are any  questions/concerns. -Patient/POA to call should there be question/concern in the interim.  No follow-ups on file.  Delon LITTIE Merlin, DPM      [place] LOCATION: [address] [place] [address]                   Office [phone]   [facility] LOCATION: [address] [place], [place] [address] Office [phone]

## 2023-07-21 NOTE — Progress Notes (Signed)
 [facility] [address] [place], [place] [address]  Internal MEDICINE  Office Visit Note  Patient Name: Johnny Norman  [id number]  [id number]  Date of Service: 07/21/2023  Chief Complaint  Patient presents with   Hyperlipidemia   Follow-up    HPI Johnny Norman presents for a follow-up visit for lab results, high cholesterol, warfarin therapy, INR check, hypertension and hypothyroidism.  Decreased kidney function, periodically monitoring Warfarin therapy -- elevated at 4.6, skip today and tomorrow's doses.  Hypertension -- blood pressure is good Bilirubin remains elevated but slightly improved Cholesterol levels are normal, on atrovastatin.  Thyroid  labs are good, taking levothyroxine .     Current Medication: Outpatient Encounter Medications as of 07/21/2023  Medication Sig   atorvastatin  (LIPITOR) 10 MG tablet Take 1 tablet (10 mg total) by mouth daily.   Blood Pressure Monitor KIT 1 each by Does not apply route daily.   furosemide  (LASIX ) 20 MG tablet Take 1 tablet (20 mg total) by mouth daily.   levothyroxine  (SYNTHROID ) 88 MCG tablet Take 1 tablet (88 mcg total) by mouth daily before breakfast.   lisinopril  (ZESTRIL ) 2.5 MG tablet Take 1 tablet (2.5 mg total) by mouth daily.   Multiple Vitamin (MULTIVITAMIN) tablet Take 1 tablet by mouth daily.   nadolol  (CORGARD ) 20 MG tablet Take 1 tablet (20 mg total) by mouth daily.   trospium  (SANCTURA ) 20 MG tablet Take 1 tablet (20 mg total) by mouth daily. Take in the morning.   Vibegron  (GEMTESA ) 75 MG TABS Take 1 tablet (75 mg total) by mouth daily. Take in the evening.   warfarin (COUMADIN ) 5 MG tablet Take 5 mg by mouth on Saturday and Sunday, and take 2.5 mg by mouth daily on Monday, Tuesday, Wednesday, Thursday and Friday.   No facility-administered encounter medications on file as of 07/21/2023.    Surgical History: Past Surgical History:  Procedure Laterality Date   APPENDECTOMY     BOWEL RESECTION   10/12/2014   Procedure: SMALL BOWEL RESECTION;  Surgeon: Dorothyann LITTIE Husk, MD;  Location: [facility] ORS;  Service: General;;   CHOLECYSTECTOMY     COLONOSCOPY     INSERT / REPLACE / REMOVE PACEMAKER  09/13/2008   SSS s/p pacemaker implant; Medtronic pacemaker,.   LAPAROTOMY N/A 10/12/2014   Procedure: EXPLORATORY LAPAROTOMY;  Surgeon: Dorothyann LITTIE Husk, MD;  Location: [facility] ORS;  Service: General;  Laterality: N/A;   PACEMAKER INSERTION N/A 12/30/2017   Procedure: GENERATOR CHANGE OUT- DUAL CHAMBER;  Surgeon: Britta King, MD;  Location: [facility] ORS;  Service: Cardiovascular;  Laterality: N/A;   PROSTATE SURGERY      Medical History: Past Medical History:  Diagnosis Date   A-fib (HCC)    CHF (congestive heart failure) (HCC)    Claudication (HCC)    leg   Hyperlipidemia    Peripheral vascular disease (HCC)    Presence of permanent cardiac pacemaker    Small bowel obstruction (HCC)    Thyroid  disease     Family History: Family History  Family history unknown: Yes    Social History   Socioeconomic History   Marital status: Widowed    Spouse name: Not on file   Number of children: Not on file   Years of education: Not on file   Highest education level: Not on file  Occupational History   Not on file  Tobacco Use   Smoking status: Never    Passive exposure: Never   Smokeless tobacco: Never  Vaping Use   Vaping  status: Never Used  Substance and Sexual Activity   Alcohol use: No    Comment: occasional   Drug use: No   Sexual activity: Yes    Birth control/protection: None  Other Topics Concern   Not on file  Social History Narrative   Not on file   Social Drivers of Health   Financial Resource Strain: Not on file  Food Insecurity: Not on file  Transportation Needs: Not on file  Physical Activity: Not on file  Stress: Not on file  Social Connections: Not on file  Intimate Partner Violence: Not on file      Review of Systems  Constitutional:  Negative for  chills, fatigue and unexpected weight change.  HENT:  Negative for congestion, postnasal drip, rhinorrhea, sneezing and sore throat.   Eyes:  Negative for redness.  Respiratory: Negative.  Negative for cough, chest tightness, shortness of breath and wheezing.   Cardiovascular: Negative.  Negative for chest pain and palpitations.  Gastrointestinal:  Negative for abdominal pain, constipation, diarrhea, nausea and vomiting.  Genitourinary:  Positive for difficulty urinating and frequency. Negative for dysuria.  Musculoskeletal:  Negative for arthralgias, back pain, joint swelling and neck pain.  Skin:  Negative for rash.  Neurological: Negative.  Negative for tremors and numbness.  Hematological:  Negative for adenopathy. Does not bruise/bleed easily.  Psychiatric/Behavioral:  Negative for behavioral problems (Depression), sleep disturbance and suicidal ideas. The patient is not nervous/anxious.     Vital Signs: BP 126/78   Pulse 75   Temp 98.1 F (36.7 C)   Resp 16   Ht 6' 2 (1.88 m)   Wt 182 lb 3.2 oz (82.6 kg)   SpO2 96%   BMI 23.39 kg/m    Physical Exam Vitals reviewed.  Constitutional:      General: He is not in acute distress.    Appearance: Normal appearance. He is normal weight. He is not ill-appearing.  HENT:     Head: Normocephalic and atraumatic.  Eyes:     Pupils: Pupils are equal, round, and reactive to light.  Cardiovascular:     Rate and Rhythm: Normal rate and regular rhythm.     Heart sounds: Normal heart sounds. No murmur heard. Pulmonary:     Effort: Pulmonary effort is normal. No respiratory distress.     Breath sounds: Normal breath sounds. No wheezing.  Skin:    General: Skin is warm and dry.     Capillary Refill: Capillary refill takes less than 2 seconds.  Neurological:     Mental Status: He is alert and oriented to person, place, and time.  Psychiatric:        Mood and Affect: Mood normal.        Behavior: Behavior normal.         Assessment/Plan: 1. Longstanding persistent atrial fibrillation (HCC) (Primary) Skip warfarin dose for today and tomorrow. Nurse visit for repeat INR check in 1 week.  - POCT INR  2. Essential hypertension Stable, continue nadolol , furosemide , and lisinopril  as prescribed.   3. Stage 3b chronic kidney disease (HCC) Continue to monitor labs periodically.   4. Hypothyroidism due to Hashimoto thyroiditis Continue levothyroxine  as prescribed.   5. Mixed hyperlipidemia Continue atorvastatin  as prescribed.    General Counseling: Johnny Norman verbalizes understanding of the findings of todays visit and agrees with plan of treatment. I have discussed any further diagnostic evaluation that may be needed or ordered today. We also reviewed his medications today. he has been encouraged to call  the office with any questions or concerns that should arise related to todays visit.    Orders Placed This Encounter  Procedures   POCT INR    No orders of the defined types were placed in this encounter.   Return for INR check in 1 week nurse visit. follow up in 2 months with Cary Lothrop pcp.   Total time spent:30 Minutes Time spent includes review of chart, medications, test results, and follow up plan with the patient.   [place] Controlled Substance Database was reviewed by me.  This patient was seen by Mardy Maxin, FNP-C in collaboration with Dr. Sigrid Bathe as a part of collaborative care agreement.   Odena Mcquaid R. Maxin, MSN, FNP-C Internal medicine

## 2023-07-28 ENCOUNTER — Ambulatory Visit (INDEPENDENT_AMBULATORY_CARE_PROVIDER_SITE_OTHER): Admitting: Nurse Practitioner

## 2023-07-28 DIAGNOSIS — I4811 Longstanding persistent atrial fibrillation: Secondary | ICD-10-CM

## 2023-07-28 LAB — POCT INR
INR: 4.9 — AB (ref 2.0–3.0)
Prothrombin Time: 59.2

## 2023-07-28 NOTE — Progress Notes (Signed)
 Patient INR today in office was 4.9, Per Alyssa he is to skip Tuesday and Wednesday, Start back taking warfarin on Thursday 2.5 mg daily and we will recheck in 1 week.

## 2023-08-04 ENCOUNTER — Ambulatory Visit (INDEPENDENT_AMBULATORY_CARE_PROVIDER_SITE_OTHER): Admitting: Nurse Practitioner

## 2023-08-04 DIAGNOSIS — I4811 Longstanding persistent atrial fibrillation: Secondary | ICD-10-CM

## 2023-08-04 LAB — POCT INR
INR: 2 (ref 2.0–3.0)
Prothrombin Time: 24.5

## 2023-08-04 NOTE — Progress Notes (Signed)
 Pt INR 2.0 as per alyssa advised son that continue coumadin  2.5 mg daily and follow in 2 weeks

## 2023-08-11 NOTE — Progress Notes (Unsigned)
  Electrophysiology Office Note:    Date:  08/11/2023   ID:  Johnny Norman, DOB 29-Nov-1925, MRN [id number]  [facility] Cardiologist:  None  [facility] Electrophysiologist:  OLE ONEIDA HOLTS, MD   Referring MD: Liana Fish, NP   Chief Complaint: Sick sinus syndrome and permanent pacemaker in situ  History of Present Illness:    Johnny Norman is a 89 year old man who I am seeing today to establish care for his permanent pacemaker.  His pacemaker was implanted for sick sinus syndrome and atrial fibrillation.  He previously saw Dr. Gollan in 2019.  Previously saw Dr. Delena for pacemaker follow-up.      Their past medical, social and family history was reviewed.   ROS:   Please see the history of present illness.    All other systems reviewed and are negative.  EKGs/Labs/Other Studies Reviewed:    The following studies were reviewed today:  August 12, 2023 in-clinic device interrogation personally reviewed ***  December 24, 2017 2 view chest x-ray reviewed.  Dual-chamber permanent pacemaker present in the left prepectoral position.  Leads in acceptable positions.  December 01, 2022 EKG shows paced rhythm.        Physical Exam:    VS:  There were no vitals taken for this visit.    Wt Readings from Last 3 Encounters:  07/21/23 182 lb 3.2 oz (82.6 kg)  07/17/23 174 lb 9.6 oz (79.2 kg)  06/16/23 174 lb 9.6 oz (79.2 kg)     GEN: no distress CARD: RRR, No MRG RESP: No IWOB. CTAB.        ASSESSMENT AND PLAN:    No diagnosis found.  #Sick sinus syndrome #Permanent pacemaker in situ Will establish the patient for remote monitoring in our clinic. Obtain baseline echocardiogram given chronic RV pacing***  #Atrial fibrillation On Coumadin  for stroke prophylaxis  Follow-up 1 year with EP APP      Signed, OLE T. HOLTS, MD, [facility], [facility] 08/11/2023 10:52 AM    Electrophysiology [facility]

## 2023-08-12 ENCOUNTER — Encounter: Payer: Self-pay | Admitting: Cardiology

## 2023-08-12 ENCOUNTER — Ambulatory Visit: Attending: Cardiology | Admitting: Cardiology

## 2023-08-12 VITALS — BP 120/78 | HR 82 | Ht 74.0 in | Wt 181.5 lb

## 2023-08-12 DIAGNOSIS — R011 Cardiac murmur, unspecified: Secondary | ICD-10-CM | POA: Diagnosis not present

## 2023-08-12 DIAGNOSIS — I495 Sick sinus syndrome: Secondary | ICD-10-CM

## 2023-08-12 DIAGNOSIS — Z950 Presence of cardiac pacemaker: Secondary | ICD-10-CM | POA: Diagnosis not present

## 2023-08-12 DIAGNOSIS — I4891 Unspecified atrial fibrillation: Secondary | ICD-10-CM | POA: Diagnosis not present

## 2023-08-12 NOTE — Patient Instructions (Addendum)
 Medication Instructions:  Your physician recommends that you continue on your current medications as directed. Please refer to the Current Medication list given to you today.  *If you need a refill on your cardiac medications before your next appointment, please call your pharmacy*  Lab Work: None ordered.  You may go to any [facility] Location for your lab work:  [facility] - [address] ([facility]) - [address] [phone] [address]  [place] - [address]   [place]  - [address]   [place] - [address] - [address] [employer]  - [address] ([facility]   If you have labs (blood work) drawn today and your tests are completely normal, you will receive your results only by: [employer] (if you have MyChart)  If you have any lab test that is abnormal or we need to change your treatment, we will call you or send a MyChart message to review the results.  Testing/Procedures: Your physician has requested that you have an echocardiogram. Echocardiography is a painless test that uses sound waves to create images of your heart. It provides your doctor with information about the size and shape of your heart and how well your heart's chambers and valves are working. This procedure takes approximately one hour. There are no restrictions for this procedure. Please do NOT wear cologne, perfume, aftershave, or lotions (deodorant is allowed). Please arrive 15 minutes prior to your appointment time.  Please note: We ask at that you not bring children with you during ultrasound (echo/ vascular) testing. Due to room size and safety concerns, children are not allowed in the ultrasound rooms during exams. Our front office staff cannot provide observation of children in our lobby area while testing is being conducted. An adult accompanying a patient  to their appointment will only be allowed in the ultrasound room at the discretion of the ultrasound technician under special circumstances. We apologize for any inconvenience.   Follow-Up: At [facility], you and your health needs are our priority.  As part of our continuing mission to provide you with exceptional heart care, we have created designated Provider Care Teams.  These Care Teams include your primary Cardiologist (physician) and Advanced Practice Providers (APPs -  Physician Assistants and Nurse Practitioners) who all work together to provide you with the care you need, when you need it.  We recommend signing up for the patient portal called MyChart.  Sign up information is provided on this After Visit Summary.  MyChart is used to connect with patients for Virtual Visits (Telemedicine).  Patients are able to view lab/test results, encounter notes, upcoming appointments, etc.  Non-urgent messages can be sent to your provider as well.   To learn more about what you can do with MyChart, go to [email or web address].    Your next appointment:   1 year(s)  The format for your next appointment:   In Person  Provider:   Ole Holts, MD or one of the following Advanced Practice Providers on your designated Care Team:   Charlies Arthur, PA-C Michael Andy Tillery, PA-C Brandy Ollis, NP  Note: Remote monitoring is used to monitor your Pacemaker/ ICD from home. This monitoring reduces the number of office visits required to check your device to one time per year. It allows us  to keep an eye on the functioning  of your device to ensure it is working properly.

## 2023-08-12 NOTE — Addendum Note (Signed)
 Addended by: Avaline Stillson N on: 08/12/2023 12:00 PM   Modules accepted: Orders

## 2023-08-18 ENCOUNTER — Ambulatory Visit (INDEPENDENT_AMBULATORY_CARE_PROVIDER_SITE_OTHER)

## 2023-08-18 ENCOUNTER — Telehealth: Payer: Self-pay | Admitting: Nurse Practitioner

## 2023-08-18 DIAGNOSIS — I4811 Longstanding persistent atrial fibrillation: Secondary | ICD-10-CM

## 2023-08-18 LAB — POCT INR
INR: 2.7 (ref 2.0–3.0)
Prothrombin Time: 32.3

## 2023-08-18 NOTE — Progress Notes (Signed)
 Pt INR 2.7 and  PT 32.3 as per dr fernand  advised son that continue coumadin  2.5 mg daily and follow up in 2 weeks

## 2023-08-18 NOTE — Telephone Encounter (Signed)
 Dermatology appointment 09/16/2023 @ [facility]

## 2023-08-24 ENCOUNTER — Encounter: Payer: Self-pay | Admitting: Nurse Practitioner

## 2023-08-24 DIAGNOSIS — N1832 Chronic kidney disease, stage 3b: Secondary | ICD-10-CM | POA: Insufficient documentation

## 2023-09-01 ENCOUNTER — Ambulatory Visit (INDEPENDENT_AMBULATORY_CARE_PROVIDER_SITE_OTHER): Admitting: Nurse Practitioner

## 2023-09-01 DIAGNOSIS — Z7901 Long term (current) use of anticoagulants: Secondary | ICD-10-CM | POA: Diagnosis not present

## 2023-09-01 LAB — POCT INR
INR: 2.3 (ref 2.0–3.0)
Prothrombin Time: 30.1

## 2023-09-01 NOTE — Progress Notes (Signed)
 Patient's INR today is 2.3, he is taking 2.5mg  Warfarin daily. Per AA, continue the same regimen and we will recheck in 1 month.

## 2023-09-04 ENCOUNTER — Telehealth: Payer: Self-pay

## 2023-09-04 NOTE — Progress Notes (Signed)
   09/04/2023  Patient ID: Johnny Norman, male   DOB: Jan 25, 1926, 89 y.o.   MRN: [id number]  This patient is appearing on a report for being at risk of failing the adherence measure for identified medications this calendar year.   Medication Adherence Summary (STAR/HEDIS Monitoring): Adherence Category: cholesterol (statin)    Drug Name: Atorvastatin  10 mg  Sold Date:07/10/2023 Days' Supply: 90      Notes: ? Adherence data was not pulled from pharmacy claims portal Dr. Annemarie. I called [facility] for the above information.  ? Reviewed barriers to adherence: N/A. ? Plan: Will follow up prior to next fill in September  Dorcas Solian, PharmD Clinical Pharmacist Cell: [phone]

## 2023-09-15 DIAGNOSIS — H903 Sensorineural hearing loss, bilateral: Secondary | ICD-10-CM | POA: Diagnosis not present

## 2023-09-16 DIAGNOSIS — D0439 Carcinoma in situ of skin of other parts of face: Secondary | ICD-10-CM | POA: Diagnosis not present

## 2023-09-16 DIAGNOSIS — D2272 Melanocytic nevi of left lower limb, including hip: Secondary | ICD-10-CM | POA: Diagnosis not present

## 2023-09-16 DIAGNOSIS — L538 Other specified erythematous conditions: Secondary | ICD-10-CM | POA: Diagnosis not present

## 2023-09-16 DIAGNOSIS — D2262 Melanocytic nevi of left upper limb, including shoulder: Secondary | ICD-10-CM | POA: Diagnosis not present

## 2023-09-16 DIAGNOSIS — D2271 Melanocytic nevi of right lower limb, including hip: Secondary | ICD-10-CM | POA: Diagnosis not present

## 2023-09-16 DIAGNOSIS — L2989 Other pruritus: Secondary | ICD-10-CM | POA: Diagnosis not present

## 2023-09-16 DIAGNOSIS — D225 Melanocytic nevi of trunk: Secondary | ICD-10-CM | POA: Diagnosis not present

## 2023-09-16 DIAGNOSIS — D2261 Melanocytic nevi of right upper limb, including shoulder: Secondary | ICD-10-CM | POA: Diagnosis not present

## 2023-09-16 DIAGNOSIS — L820 Inflamed seborrheic keratosis: Secondary | ICD-10-CM | POA: Diagnosis not present

## 2023-09-16 DIAGNOSIS — L821 Other seborrheic keratosis: Secondary | ICD-10-CM | POA: Diagnosis not present

## 2023-09-16 DIAGNOSIS — C4441 Basal cell carcinoma of skin of scalp and neck: Secondary | ICD-10-CM | POA: Diagnosis not present

## 2023-09-24 ENCOUNTER — Ambulatory Visit

## 2023-09-24 DIAGNOSIS — R011 Cardiac murmur, unspecified: Secondary | ICD-10-CM

## 2023-09-24 LAB — ECHOCARDIOGRAM COMPLETE: S' Lateral: 2.78 cm

## 2023-09-29 ENCOUNTER — Ambulatory Visit: Payer: Self-pay | Admitting: Cardiology

## 2023-09-29 DIAGNOSIS — I361 Nonrheumatic tricuspid (valve) insufficiency: Secondary | ICD-10-CM

## 2023-10-02 ENCOUNTER — Ambulatory Visit (INDEPENDENT_AMBULATORY_CARE_PROVIDER_SITE_OTHER): Admitting: Nurse Practitioner

## 2023-10-02 DIAGNOSIS — Z7901 Long term (current) use of anticoagulants: Secondary | ICD-10-CM

## 2023-10-02 DIAGNOSIS — I4891 Unspecified atrial fibrillation: Secondary | ICD-10-CM

## 2023-10-02 LAB — POCT INR
POC INR: 2.1
Prothrombin Time: 25.7

## 2023-10-02 NOTE — Progress Notes (Signed)
 Patient's INR today was 2.1 and 25.7. Patient currently taking 2.5mg  Warfarin daily, per AA, no change, follow-up in 1 month.

## 2023-10-05 ENCOUNTER — Telehealth: Payer: Self-pay

## 2023-10-05 NOTE — Progress Notes (Signed)
   10/05/2023  Patient ID: Johnny Norman, male   DOB: 06-11-1925, 89 y.o.   MRN: [id number]  This patient is appearing on a report for being at risk of failing the adherence measure for identified medications this calendar year.   Medication Adherence Summary (STAR/HEDIS Monitoring): Adherence Category: cholesterol (statin)    Drug Name: Atorvastatin  10 mg  Sold Date:07/27/2023 (was previously told 07/10/2023) Days' Supply: 90      Notes: ? Adherence data was not pulled from pharmacy claims portal Dr. Annemarie. I called [facility] for the above information.  ? Reviewed barriers to adherence: N/A. ? Plan: Will follow up prior to next fill at the end of September  Dorcas Solian, PharmD Clinical Pharmacist Cell: [phone]

## 2023-10-08 ENCOUNTER — Telehealth: Payer: Self-pay

## 2023-10-08 NOTE — Telephone Encounter (Signed)
 PA has been submitted to [facility]. Waiting on response from insurance company.  R Ellenberger (Key: [id number])

## 2023-10-19 ENCOUNTER — Telehealth: Payer: Self-pay | Admitting: Nurse Practitioner

## 2023-10-19 NOTE — Telephone Encounter (Addendum)
 Lvm & sent mychart msg to move 10/20/23 appointment to -[facility]

## 2023-10-20 ENCOUNTER — Ambulatory Visit: Admitting: Nurse Practitioner

## 2023-10-21 DIAGNOSIS — H903 Sensorineural hearing loss, bilateral: Secondary | ICD-10-CM | POA: Diagnosis not present

## 2023-10-28 ENCOUNTER — Ambulatory Visit: Payer: Medicare PPO | Admitting: Physician Assistant

## 2023-10-29 ENCOUNTER — Encounter: Payer: Self-pay | Admitting: Podiatry

## 2023-10-29 ENCOUNTER — Ambulatory Visit: Admitting: Podiatry

## 2023-10-29 DIAGNOSIS — M79674 Pain in right toe(s): Secondary | ICD-10-CM | POA: Diagnosis not present

## 2023-10-29 DIAGNOSIS — M79675 Pain in left toe(s): Secondary | ICD-10-CM | POA: Diagnosis not present

## 2023-10-29 DIAGNOSIS — B351 Tinea unguium: Secondary | ICD-10-CM

## 2023-10-29 NOTE — Progress Notes (Signed)
  Subjective:  Patient ID: Johnny Norman, male    DOB: 03-Jun-1925,  MRN: [id number]  89 y.o. male presents painful mycotic toenails of both feet that are difficult to trim. Pain interferes with daily activities and wearing enclosed shoe gear comfortably. He is accompanied by his son on today's visit. His son was seated in the lobby. Chief Complaint  Patient presents with   Toe Pain    RFC. Patient has no other concerns   New problem(s): None   PCP is Liana Fish, NP.  No Known Allergies  Review of Systems: Negative except as noted in the HPI.   Objective:  Akul Leggette is a pleasant 89 y.o. male in NAD. AAO x 3.  Vascular Examination: Capillary refill time immediate b/l. Palpable DP pulses b/l. Diminished PT pulses b/l. Pedal hair absent b/l. Pedal edema +1 left foot. No pain with calf compression b/l. Skin temperature gradient WNL b/l. No cyanosis or clubbing b/l. No ischemia or gangrene noted b/l.   Neurological Examination: Sensation grossly intact b/l with 10 gram monofilament. Vibratory sensation intact b/l.   Dermatological Examination: Pedal skin with normal turgor, texture and tone b/l.  No open wounds. No interdigital macerations.   Toenails 1-5 b/l thick, discolored, elongated with subungual debris and pain on dorsal palpation.   No corns, calluses, nor porokeratotic lesions.  Musculoskeletal Examination: Muscle strength 5/5 to all lower extremity muscle groups bilaterally. No pain, crepitus or joint limitation noted with ROM bilateral LE. Pes planus deformity noted right lower extremity. Pes planovalgus deformity noted left lower extremity. Utilizes cane for ambulation assistance.  Radiographs: None Assessment:   1. Pain due to onychomycosis of toenails of both feet    Plan:  -Consent given for treatment as described below: -Examined patient. -Patient to continue soft, supportive shoe gear daily. -Mycotic toenails 1-5 bilaterally were  debrided in length and girth with sterile nail nippers and dremel. Pinpoint bleeding of left great toe addressed with Lumicain Hemostatic Solution, cleansed with alcohol. Triple antibiotic ointment applied. No further treatment required by patient/caregiver. -Patient/POA to call should there be question/concern in the interim.  Return in about 3 months (around 01/29/2024).  Delon LITTIE Merlin, DPM      [place] LOCATION: [address] [place] [address]                   Office [phone]   [facility] LOCATION: [address] [place], [place] [address] Office [phone]

## 2023-10-30 ENCOUNTER — Ambulatory Visit

## 2023-10-30 ENCOUNTER — Ambulatory Visit: Admitting: Nurse Practitioner

## 2023-10-30 ENCOUNTER — Encounter: Payer: Self-pay | Admitting: Nurse Practitioner

## 2023-10-30 VITALS — BP 134/86 | HR 93 | Temp 98.4°F | Resp 16 | Ht 74.0 in | Wt 182.0 lb

## 2023-10-30 DIAGNOSIS — I4811 Longstanding persistent atrial fibrillation: Secondary | ICD-10-CM

## 2023-10-30 DIAGNOSIS — E063 Autoimmune thyroiditis: Secondary | ICD-10-CM

## 2023-10-30 DIAGNOSIS — Z950 Presence of cardiac pacemaker: Secondary | ICD-10-CM

## 2023-10-30 DIAGNOSIS — I10 Essential (primary) hypertension: Secondary | ICD-10-CM

## 2023-10-30 DIAGNOSIS — Z7901 Long term (current) use of anticoagulants: Secondary | ICD-10-CM

## 2023-10-30 DIAGNOSIS — N1832 Chronic kidney disease, stage 3b: Secondary | ICD-10-CM

## 2023-10-30 LAB — POCT INR
POC INR: 1.9
Prothrombin Time: 22.5

## 2023-10-30 MED ORDER — FUROSEMIDE 20 MG PO TABS: 20.0000 mg | ORAL_TABLET | Freq: Every day | ORAL | 3 refills | Status: DC

## 2023-10-30 MED ORDER — LISINOPRIL 2.5 MG PO TABS: 2.5000 mg | ORAL_TABLET | Freq: Every day | ORAL | 1 refills | Status: AC

## 2023-10-30 NOTE — Progress Notes (Signed)
 [facility] [address] [place], [place] [address]  Internal MEDICINE  Office Visit Note  Patient Name: Johnny Norman  [id number]  [id number]  Date of Service: 10/30/2023  Chief Complaint  Patient presents with   Follow-up   Hyperlipidemia   Quality Metric Gaps    AWV, Pneumonia and Shingles vaccines    HPI Johnny Norman presents for a follow-up visit for INR check, warfarin therapy, hypertension, atrial fibrillation, high cholesterol, and hypothyroidism.  Due for AWV  Longstanding persistent AFIB -- INR is 1.9 today, and PT is 22.5 seconds. He takes warfarin 2.5 mg daily.  Requesting handicap placard form to be filled out.  Hypertension -- controlled with furosemide , nadolol , and lisinopril  High cholesterol -- takes atorvastatin  daily.  Hypothyroidism -- takes levothyroxine  daily.    Current Medication: Outpatient Encounter Medications as of 10/30/2023  Medication Sig   atorvastatin  (LIPITOR) 10 MG tablet Take 1 tablet (10 mg total) by mouth daily.   Blood Pressure Monitor KIT 1 each by Does not apply route daily.   levothyroxine  (SYNTHROID ) 88 MCG tablet Take 1 tablet (88 mcg total) by mouth daily before breakfast.   Multiple Vitamin (MULTIVITAMIN) tablet Take 1 tablet by mouth daily.   nadolol  (CORGARD ) 20 MG tablet Take 1 tablet (20 mg total) by mouth daily.   trospium  (SANCTURA ) 20 MG tablet Take 1 tablet (20 mg total) by mouth daily. Take in the morning.   Vibegron  (GEMTESA ) 75 MG TABS Take 1 tablet (75 mg total) by mouth daily. Take in the evening.   warfarin (COUMADIN ) 5 MG tablet Take 5 mg by mouth on Saturday and Sunday, and take 2.5 mg by mouth daily on Monday, Tuesday, Wednesday, Thursday and Friday.   [DISCONTINUED] furosemide  (LASIX ) 20 MG tablet Take 1 tablet (20 mg total) by mouth daily.   [DISCONTINUED] lisinopril  (ZESTRIL ) 2.5 MG tablet Take 1 tablet (2.5 mg total) by mouth daily.   furosemide  (LASIX ) 20 MG tablet Take 1 tablet (20 mg total)  by mouth daily.   lisinopril  (ZESTRIL ) 2.5 MG tablet Take 1 tablet (2.5 mg total) by mouth daily.   No facility-administered encounter medications on file as of 10/30/2023.    Surgical History: Past Surgical History:  Procedure Laterality Date   APPENDECTOMY     BOWEL RESECTION  10/12/2014   Procedure: SMALL BOWEL RESECTION;  Surgeon: Dorothyann LITTIE Husk, MD;  Location: [facility] ORS;  Service: General;;   CHOLECYSTECTOMY     COLONOSCOPY     INSERT / REPLACE / REMOVE PACEMAKER  09/13/2008   SSS s/p pacemaker implant; [employer] pacemaker,.   LAPAROTOMY N/A 10/12/2014   Procedure: EXPLORATORY LAPAROTOMY;  Surgeon: Dorothyann LITTIE Husk, MD;  Location: [facility];  Service: General;  Laterality: N/A;   PACEMAKER INSERTION N/A 12/30/2017   Procedure: GENERATOR CHANGE OUT- DUAL CHAMBER;  Surgeon: Britta King, MD;  Location: [facility] OR[facility];  Service: Cardiovascular;  Laterality: N/A;   PROSTATE SURGERY      Medical History: Past Medical History:  Diagnosis Date   A-fib (HCC)    CHF (congestive heart failure) (HCC)    Claudication    leg   Hyperlipidemia    Peripheral vascular disease    Presence of permanent cardiac pacemaker    Small bowel obstruction (HCC)    Thyroid  disease     Family History: Family History  Family history unknown: Yes    Social History   Socioeconomic History   Marital status: Widowed    Spouse name: Not on file  Number of children: Not on file   Years of education: Not on file   Highest education level: Not on file  Occupational History   Not on file  Tobacco Use   Smoking status: Never    Passive exposure: Never   Smokeless tobacco: Never  Vaping Use   Vaping status: Never Used  Substance and Sexual Activity   Alcohol use: No    Comment: occasional   Drug use: No   Sexual activity: Yes    Birth control/protection: None  Other Topics Concern   Not on file  Social History Narrative   Not on file   Social Drivers of Health   Financial Resource  Strain: Not on file  Food Insecurity: Not on file  Transportation Needs: Not on file  Physical Activity: Not on file  Stress: Not on file  Social Connections: Not on file  Intimate Partner Violence: Not on file      Review of Systems  Constitutional:  Negative for chills, fatigue and unexpected weight change.  HENT:  Negative for congestion, postnasal drip, rhinorrhea, sneezing and sore throat.   Eyes:  Negative for redness.  Respiratory: Negative.  Negative for cough, chest tightness, shortness of breath and wheezing.   Cardiovascular: Negative.  Negative for chest pain and palpitations.  Gastrointestinal:  Negative for abdominal pain, constipation, diarrhea, nausea and vomiting.  Genitourinary:  Positive for difficulty urinating and frequency. Negative for dysuria.  Musculoskeletal:  Negative for arthralgias, back pain, joint swelling and neck pain.  Skin:  Negative for rash.  Neurological: Negative.  Negative for tremors and numbness.  Hematological:  Negative for adenopathy. Does not bruise/bleed easily.  Psychiatric/Behavioral:  Negative for behavioral problems (Depression), sleep disturbance and suicidal ideas. The patient is not nervous/anxious.     Vital Signs: BP (!) 142/94   Pulse 93   Temp 98.4 F (36.9 C)   Resp 16   Ht 6' 2 (1.88 m)   Wt 182 lb (82.6 kg)   SpO2 94%   BMI 23.37 kg/m    Physical Exam Vitals reviewed.  Constitutional:      General: He is not in acute distress.    Appearance: Normal appearance. He is normal weight. He is not ill-appearing.  HENT:     Head: Normocephalic and atraumatic.  Eyes:     Pupils: Pupils are equal, round, and reactive to light.  Cardiovascular:     Rate and Rhythm: Normal rate and regular rhythm.     Heart sounds: Normal heart sounds. No murmur heard. Pulmonary:     Effort: Pulmonary effort is normal. No respiratory distress.     Breath sounds: Normal breath sounds. No wheezing.  Skin:    General: Skin is warm  and dry.     Capillary Refill: Capillary refill takes less than 2 seconds.  Neurological:     Mental Status: He is alert and oriented to person, place, and time.  Psychiatric:        Mood and Affect: Mood normal.        Behavior: Behavior normal.        Assessment/Plan: 1. Longstanding persistent atrial fibrillation (HCC) (Primary) INR is 1.9, but was previously within therapeutic range, no change in therapy for now, continue warfarin 2.5 daily and recheck INR at nurse visit in 1 month  - POCT INR  2. Essential hypertension Stable, continue lisinopril  and furosemide  as prescribed.  - lisinopril  (ZESTRIL ) 2.5 MG tablet; Take 1 tablet (2.5 mg total) by mouth daily.  Dispense: 90 tablet; Refill: 1 - furosemide  (LASIX ) 20 MG tablet; Take 1 tablet (20 mg total) by mouth daily.  Dispense: 30 tablet; Refill: 3  3. Stage 3b chronic kidney disease (HCC) Continue to monitor labs periodically. Continue furosemide  as prescribed.   4. Hypothyroidism due to Hashimoto thyroiditis Continue levothyroxine  as prescribed   5. Encounter for current long-term use of anticoagulants INR check, no change in warfarin therapy for now, recheck INR in 1 month  - POCT INR  6. Presence of permanent cardiac pacemaker Noted, history of sick sinus syndrome   General Counseling: Knoah verbalizes understanding of the findings of todays visit and agrees with plan of treatment. I have discussed any further diagnostic evaluation that may be needed or ordered today. We also reviewed his medications today. he has been encouraged to call the office with any questions or concerns that should arise related to todays visit.    Orders Placed This Encounter  Procedures   POCT INR    Meds ordered this encounter  Medications   lisinopril  (ZESTRIL ) 2.5 MG tablet    Sig: Take 1 tablet (2.5 mg total) by mouth daily.    Dispense:  90 tablet    Refill:  1   furosemide  (LASIX ) 20 MG tablet    Sig: Take 1 tablet (20  mg total) by mouth daily.    Dispense:  30 tablet    Refill:  3    Return in about 2 months (around 12/29/2023) for AWV, Jodee Wagenaar PCP and also needs a INR check nurse visit in 1 month. .   Total time spent:30 Minutes Time spent includes review of chart, medications, test results, and follow up plan with the patient.   [place] Controlled Substance Database was reviewed by me.  This patient was seen by Mardy Maxin, FNP-C in collaboration with Dr. Sigrid Bathe as a part of collaborative care agreement.   Shylyn Younce R. Maxin, MSN, FNP-C Internal medicine

## 2023-11-09 ENCOUNTER — Ambulatory Visit: Admitting: Cardiovascular Disease

## 2023-11-09 NOTE — Progress Notes (Deleted)
 NO SHOW

## 2023-11-10 ENCOUNTER — Ambulatory Visit: Attending: Cardiovascular Disease | Admitting: Cardiovascular Disease

## 2023-11-10 DIAGNOSIS — R011 Cardiac murmur, unspecified: Secondary | ICD-10-CM

## 2023-11-10 DIAGNOSIS — I495 Sick sinus syndrome: Secondary | ICD-10-CM

## 2023-11-10 DIAGNOSIS — I10 Essential (primary) hypertension: Secondary | ICD-10-CM

## 2023-11-10 DIAGNOSIS — Z950 Presence of cardiac pacemaker: Secondary | ICD-10-CM

## 2023-11-10 DIAGNOSIS — I361 Nonrheumatic tricuspid (valve) insufficiency: Secondary | ICD-10-CM

## 2023-11-10 DIAGNOSIS — N1832 Chronic kidney disease, stage 3b: Secondary | ICD-10-CM

## 2023-11-10 DIAGNOSIS — E782 Mixed hyperlipidemia: Secondary | ICD-10-CM

## 2023-11-10 DIAGNOSIS — I4811 Longstanding persistent atrial fibrillation: Secondary | ICD-10-CM

## 2023-11-11 ENCOUNTER — Encounter

## 2023-11-16 ENCOUNTER — Encounter: Payer: Self-pay | Admitting: Cardiovascular Disease

## 2023-12-01 ENCOUNTER — Telehealth: Payer: Self-pay

## 2023-12-01 ENCOUNTER — Ambulatory Visit (INDEPENDENT_AMBULATORY_CARE_PROVIDER_SITE_OTHER): Admitting: Internal Medicine

## 2023-12-01 DIAGNOSIS — Z7901 Long term (current) use of anticoagulants: Secondary | ICD-10-CM

## 2023-12-01 DIAGNOSIS — I4891 Unspecified atrial fibrillation: Secondary | ICD-10-CM

## 2023-12-01 LAB — POCT INR
INR: 5.1 — AB (ref 2.0–3.0)
Prothrombin Time: 60.7

## 2023-12-01 NOTE — Telephone Encounter (Signed)
 One week recheck pls

## 2023-12-01 NOTE — Progress Notes (Unsigned)
 Patient's INR today is 5.1. Patient currently takes 2.5mg  Coumadin  daily. Per DFK, skip today, continue as normal after today, and will follow-up in 1 week.

## 2023-12-04 ENCOUNTER — Encounter: Payer: Self-pay | Admitting: Nurse Practitioner

## 2023-12-08 ENCOUNTER — Ambulatory Visit (INDEPENDENT_AMBULATORY_CARE_PROVIDER_SITE_OTHER): Admitting: Nurse Practitioner

## 2023-12-08 DIAGNOSIS — Z7901 Long term (current) use of anticoagulants: Secondary | ICD-10-CM

## 2023-12-08 DIAGNOSIS — I4891 Unspecified atrial fibrillation: Secondary | ICD-10-CM

## 2023-12-08 LAB — POCT INR
INR: 3 (ref 2.0–3.0)
Prothrombin Time: 36.1

## 2023-12-08 NOTE — Progress Notes (Signed)
 Patient's INR today is 3.0. Patient currently taking 2.5mg  of Coumadin , skipped 1 day last week due to INR being high. Per AA, patient to continue 2.5mg  daily, follow-up for re-check in 2 weeks.

## 2023-12-14 NOTE — Progress Notes (Unsigned)
 Cardiology Office Note  Date:  12/15/2023   ID:  Alverto Shedd, DOB [birth year], MRN [id number]  PCP:  Liana Fish, NP   Chief Complaint  Patient presents with   New Patient (Initial Visit)    Ref by Fish Liana, NP to establish care; formerly seen by Dr. Britta for general cardiology & EP is followed by Dr. Cindie.  Patient denies chest pain or shortness of breath.     HPI:  Keno Caraway Paschalis a 89 y.o. malewith past medical history of: sick sinus syndrome, pacemaker 2010 atrial fibrillation,  Hyperlipidemia who presents by referral from Fish Liana for consultation of his  atrial fibrillation  Last seen by myself in clinic September 2019 Seen by Dr. Cindie July 2025 for pacemaker Son is interested in remote monitoring  not set up on prior clinic visit, does not have device at home  Sedentary, walks with a cane, no falls Some balance issues Denies shortness of breath or chest pain No regular exercise program  Previously followed by Dr. Britta who performed pacer checks   Labs reviewed  total chol 105, LDL 47 Cr 1.6 HGB 14  EKG personally reviewed by myself on todays visit EKG Interpretation Date/Time:  Tuesday December 15 2023 10:53:39 EST Ventricular Rate:  71 PR Interval:  258 QRS Duration:  184 QT Interval:  480 QTC Calculation: 521 R Axis:   -80  Text Interpretation: Atrial-sensed ventricular-paced rhythm with prolonged AV conduction When compared with ECG of 28-Nov-2022 18:53, No significant change was found Confirmed by Perla Lye [id number]) on 12/15/2023 11:04:21 AM   Previous treadmill stress test which showed no EKG changes concerning for ischemia.  Recent echocardiogram also done by Dr. Delena showed no significant valvular pathology, good ejection fraction     PMH:   has a past medical history of A-fib (HCC), CHF (congestive heart failure) (HCC), Claudication, Hyperlipidemia, Peripheral vascular disease, Presence of  permanent cardiac pacemaker, Small bowel obstruction (HCC), and Thyroid  disease.   PSH:    Past Surgical History:  Procedure Laterality Date   APPENDECTOMY     BOWEL RESECTION  10/12/2014   Procedure: SMALL BOWEL RESECTION;  Surgeon: Dorothyann LITTIE Husk, MD;  Location: [facility];  Service: General;;   CHOLECYSTECTOMY     COLONOSCOPY     INSERT / REPLACE / REMOVE PACEMAKER  09/13/2008   SSS s/p pacemaker implant; [employer] pacemaker,.   LAPAROTOMY N/A 10/12/2014   Procedure: EXPLORATORY LAPAROTOMY;  Surgeon: Dorothyann LITTIE Husk, MD;  Location: [facility];  Service: General;  Laterality: N/A;   PACEMAKER INSERTION N/A 12/30/2017   Procedure: GENERATOR CHANGE OUT- DUAL CHAMBER;  Surgeon: Britta King, MD;  Location: [facility];  Service: Cardiovascular;  Laterality: N/A;   PROSTATE SURGERY      Current Outpatient Medications  Medication Sig Dispense Refill   atorvastatin  (LIPITOR) 10 MG tablet Take 1 tablet (10 mg total) by mouth daily. 90 tablet 3   Blood Pressure Monitor KIT 1 each by Does not apply route daily. 1 kit 0   furosemide  (LASIX ) 20 MG tablet Take 1 tablet (20 mg total) by mouth daily. 30 tablet 3   levothyroxine  (SYNTHROID ) 88 MCG tablet Take 1 tablet (88 mcg total) by mouth daily before breakfast. 90 tablet 3   lisinopril  (ZESTRIL ) 2.5 MG tablet Take 1 tablet (2.5 mg total) by mouth daily. 90 tablet 1   Multiple Vitamin (MULTIVITAMIN) tablet Take 1 tablet by mouth daily.     nadolol  (CORGARD ) 20 MG tablet Take 1 tablet (  20 mg total) by mouth daily. 90 tablet 3   trospium  (SANCTURA ) 20 MG tablet Take 1 tablet (20 mg total) by mouth daily. Take in the morning. 30 tablet 11   Vibegron  (GEMTESA ) 75 MG TABS Take 1 tablet (75 mg total) by mouth daily. Take in the evening. 30 tablet 11   warfarin (COUMADIN ) 5 MG tablet Take 5 mg by mouth on Saturday and Sunday, and take 2.5 mg by mouth daily on Monday, Tuesday, Wednesday, Thursday and Friday. 90 tablet 1   No current facility-administered  medications for this visit.     Allergies:   Patient has no known allergies.   Social History:  The patient  reports that he has never smoked. He has never been exposed to tobacco smoke. He has never used smokeless tobacco. He reports that he does not drink alcohol and does not use drugs.   Family History:   Family history is unknown by patient.    Review of Systems: Review of Systems  Constitutional: Negative.   HENT: Negative.    Respiratory: Negative.    Cardiovascular: Negative.   Gastrointestinal: Negative.   Musculoskeletal: Negative.   Neurological: Negative.   Psychiatric/Behavioral: Negative.    All other systems reviewed and are negative.   PHYSICAL EXAM: VS:  BP (!) 136/92 (BP Location: Left Arm, Patient Position: Sitting, Cuff Size: Normal)   Pulse 71   Ht 6' 4 (1.93 m)   Wt 183 lb 8 oz (83.2 kg)   SpO2 99%   BMI 22.34 kg/m  , BMI Body mass index is 22.34 kg/m. GEN: Well nourished, well developed, in no acute distress HEENT: normal, hard of hearing Neck: no JVD, carotid bruits, or masses Cardiac: RRR; no murmurs, rubs, or gallops,no edema  Respiratory:  clear to auscultation bilaterally, normal work of breathing GI: soft, nontender, nondistended, + BS MS: no deformity or atrophy Skin: warm and dry, no rash Neuro:  Strength and sensation are intact Psych: euthymic mood, full affect    Recent Labs: 07/17/2023: ALT 26; BUN 28; Creatinine, Ser 1.62; Hemoglobin 14.7; Platelets 143; Potassium 4.4; Sodium 139; TSH 1.730    Lipid Panel Lab Results  Component Value Date   CHOL 105 07/17/2023   HDL 43 07/17/2023   LDLCALC 47 07/17/2023   TRIG 74 07/17/2023      Wt Readings from Last 3 Encounters:  12/15/23 183 lb 8 oz (83.2 kg)  10/30/23 182 lb (82.6 kg)  08/12/23 181 lb 8 oz (82.3 kg)     ASSESSMENT AND PLAN:  Problem List Items Addressed This Visit       Cardiology Problems   Sick sinus syndrome (HCC)   Relevant Orders   EKG 12-Lead  (Completed)   Essential hypertension   Relevant Orders   EKG 12-Lead (Completed)   Atrial fibrillation (HCC) - Primary   Relevant Orders   EKG 12-Lead (Completed)   Hyperlipidemia     Other   Stage 3b chronic kidney disease (HCC)   Other Visit Diagnoses       Nonrheumatic tricuspid valve regurgitation       Relevant Orders   EKG 12-Lead (Completed)     Cardiac pacemaker in situ         Murmur       Relevant Orders   EKG 12-Lead (Completed)      Sick sinus syndrome Pacemaker, followed by EP We will reach out to them to determine if remote monitoring is possible even his age 73 years old  Paroxysmal atrial fibrillation Paced rhythm, on warfarin, Followed by primary care  Essential hypertension Blood pressure is well controlled on today's visit. No changes made to the medications.  Diastolic CHF Appears euvolemic, continue Lasix  20 daily lisinopril  nadolol   Hyperlipidemia Cholesterol is at goal on the current lipid regimen. No changes to the medications were made.     Signed, Velinda Lunger, M.D., Ph.D. [facility] [place], [place] [phone]

## 2023-12-15 ENCOUNTER — Encounter: Payer: Self-pay | Admitting: Cardiovascular Disease

## 2023-12-15 ENCOUNTER — Telehealth: Payer: Self-pay

## 2023-12-15 ENCOUNTER — Ambulatory Visit: Attending: Cardiovascular Disease | Admitting: Cardiovascular Disease

## 2023-12-15 VITALS — BP 136/92 | HR 71 | Ht 76.0 in | Wt 183.5 lb

## 2023-12-15 DIAGNOSIS — I10 Essential (primary) hypertension: Secondary | ICD-10-CM | POA: Diagnosis not present

## 2023-12-15 DIAGNOSIS — R011 Cardiac murmur, unspecified: Secondary | ICD-10-CM

## 2023-12-15 DIAGNOSIS — I361 Nonrheumatic tricuspid (valve) insufficiency: Secondary | ICD-10-CM

## 2023-12-15 DIAGNOSIS — Z950 Presence of cardiac pacemaker: Secondary | ICD-10-CM | POA: Diagnosis not present

## 2023-12-15 DIAGNOSIS — E782 Mixed hyperlipidemia: Secondary | ICD-10-CM | POA: Diagnosis not present

## 2023-12-15 DIAGNOSIS — I4891 Unspecified atrial fibrillation: Secondary | ICD-10-CM | POA: Diagnosis not present

## 2023-12-15 DIAGNOSIS — N1832 Chronic kidney disease, stage 3b: Secondary | ICD-10-CM

## 2023-12-15 DIAGNOSIS — I495 Sick sinus syndrome: Secondary | ICD-10-CM | POA: Diagnosis not present

## 2023-12-15 NOTE — Telephone Encounter (Signed)
 Spoke with patient's son.  Patient does not have a remote monitor.   I have ordered one through [employer]) today and gave patient's son our device clinic number to call once he receives in the mail.  It should take approximately 10-14 days to receive, son will call us  back if hasn't received in that time period.

## 2023-12-15 NOTE — Patient Instructions (Signed)

## 2023-12-15 NOTE — Telephone Encounter (Signed)
-----   Message from Nurse Doreatha C sent at 12/15/2023 11:23 AM EST ----- Hey! Patient saw Dr. Cindie in July and was supposed to be set up for remote monitoring. It doesn't look like this has been done. Can you all check on it? Its a [place] patient so will need to go to Dr. Kennyth now.  Thanks! Carly

## 2023-12-22 ENCOUNTER — Ambulatory Visit (INDEPENDENT_AMBULATORY_CARE_PROVIDER_SITE_OTHER): Admitting: Internal Medicine

## 2023-12-22 DIAGNOSIS — Z7901 Long term (current) use of anticoagulants: Secondary | ICD-10-CM

## 2023-12-22 DIAGNOSIS — I4891 Unspecified atrial fibrillation: Secondary | ICD-10-CM

## 2023-12-22 LAB — POCT INR
INR: 2.2 (ref 2.0–3.0)
Prothrombin Time: 26.3

## 2023-12-22 NOTE — Progress Notes (Signed)
 Patient's INR today is 2.2, 26.3 seconds. Patient currently takes 2.5mg  Coumadin  daily. Per DFK, no change, follow-up in 1 month.

## 2023-12-29 ENCOUNTER — Encounter: Payer: Self-pay | Admitting: Nurse Practitioner

## 2023-12-29 ENCOUNTER — Ambulatory Visit (INDEPENDENT_AMBULATORY_CARE_PROVIDER_SITE_OTHER): Admitting: Nurse Practitioner

## 2023-12-29 VITALS — BP 138/80 | HR 72 | Temp 95.8°F | Resp 16 | Ht 76.0 in | Wt 184.8 lb

## 2023-12-29 DIAGNOSIS — Z950 Presence of cardiac pacemaker: Secondary | ICD-10-CM

## 2023-12-29 DIAGNOSIS — E063 Autoimmune thyroiditis: Secondary | ICD-10-CM | POA: Diagnosis not present

## 2023-12-29 DIAGNOSIS — I10 Essential (primary) hypertension: Secondary | ICD-10-CM

## 2023-12-29 DIAGNOSIS — N401 Enlarged prostate with lower urinary tract symptoms: Secondary | ICD-10-CM | POA: Diagnosis not present

## 2023-12-29 DIAGNOSIS — I4811 Longstanding persistent atrial fibrillation: Secondary | ICD-10-CM

## 2023-12-29 DIAGNOSIS — E782 Mixed hyperlipidemia: Secondary | ICD-10-CM | POA: Diagnosis not present

## 2023-12-29 DIAGNOSIS — Z23 Encounter for immunization: Secondary | ICD-10-CM

## 2023-12-29 DIAGNOSIS — Z0001 Encounter for general adult medical examination with abnormal findings: Secondary | ICD-10-CM | POA: Diagnosis not present

## 2023-12-29 DIAGNOSIS — N1832 Chronic kidney disease, stage 3b: Secondary | ICD-10-CM | POA: Diagnosis not present

## 2023-12-29 DIAGNOSIS — N138 Other obstructive and reflux uropathy: Secondary | ICD-10-CM

## 2023-12-29 MED ORDER — WARFARIN SODIUM 5 MG PO TABS: 2.5000 mg | ORAL_TABLET | Freq: Every day | ORAL | 1 refills | Status: AC

## 2023-12-29 MED ORDER — FUROSEMIDE 20 MG PO TABS: 20.0000 mg | ORAL_TABLET | Freq: Every day | ORAL | 3 refills | Status: AC

## 2023-12-29 MED ORDER — PNEUMOCOCCAL 20-VAL CONJ VACC 0.5 ML IM SUSY: 0.5000 mL | PREFILLED_SYRINGE | Freq: Once | INTRAMUSCULAR | 0 refills | Status: AC | PRN

## 2023-12-29 NOTE — Progress Notes (Signed)
 [facility] [address] [place], [place] [address]  Internal MEDICINE  Office Visit Note  Patient Name: Johnny Norman  [id number]  [id number]  Date of Service: 12/29/2023  Chief Complaint  Patient presents with   Hyperlipidemia   Medicare Wellness    HPI Johnny Norman presents for a medicare annual wellness visit. Well-appearing 89 y.o. male with high cholesterol, AFIB, SSS, permanent pacemaker, hypothyroidism, hypertension, BPH, overactive bladder and some urinary incontinence.  Routine CRC screening: discontinued, aged out Labs: up to date, labs were done in June and previously discussed.  New or worsening pain: none  Other concerns: took yesterday and today's medication this morning which is his gemtesa  and levothyroxine . He is ok to take his medications as prescribed tomorrow morning. The rest of his medications he takes in the evenings and all of his medications are once daily.  Last INR was on 12/22/23 and it was 2.2. He will have this rechecked near the end of the month.      12/29/2023   11:06 AM  MMSE - Mini Mental State Exam  Orientation to time 5  Orientation to Place 5  Registration 3  Attention/ Calculation 5  Recall 3  Language- name 2 objects 2  Language- repeat 1  Language- follow 3 step command 3  Language- read & follow direction 1  Write a sentence 1  Copy design 1  Total score 30    Functional Status Survey: Is the patient deaf or have difficulty hearing?: Yes Does the patient have difficulty seeing, even when wearing glasses/contacts?: No Does the patient have difficulty concentrating, remembering, or making decisions?: No Does the patient have difficulty walking or climbing stairs?: Yes Does the patient have difficulty dressing or bathing?: No Does the patient have difficulty doing errands alone such as visiting a doctor's office or shopping?: Yes     11/27/2021    9:09 PM 12/12/2021    1:59 PM 06/16/2023    1:53 PM 10/30/2023    10:38 AM 12/29/2023   11:05 AM  Fall Risk  Falls in the past year?  0 0 0 0  Was there an injury with Fall?  0  0   0  Fall Risk Category Calculator  0 0  0  Fall Risk Category (Retired)  Low      (RETIRED) Patient Fall Risk Level High fall risk       Patient at Risk for Falls Due to  Impaired balance/gait;Impaired vision No Fall Risks    Fall risk Follow up  Falls evaluation completed  Falls evaluation completed  Falls evaluation completed     Data saved with a previous flowsheet row definition       12/29/2023   11:05 AM  Depression screen PHQ 2/9  Decreased Interest 0  Down, Depressed, Hopeless 0  PHQ - 2 Score 0        No data to display            Current Medication: Outpatient Encounter Medications as of 12/29/2023  Medication Sig   pneumococcal 20-valent conjugate vaccine (PREVNAR 20) 0.5 ML injection Inject 0.5 mLs into the muscle once as needed for up to 1 dose for immunization.   atorvastatin  (LIPITOR) 10 MG tablet Take 1 tablet (10 mg total) by mouth daily.   Blood Pressure Monitor KIT 1 each by Does not apply route daily.   furosemide  (LASIX ) 20 MG tablet Take 1 tablet (20 mg total) by mouth daily.   levothyroxine  (SYNTHROID ) 88  MCG tablet Take 1 tablet (88 mcg total) by mouth daily before breakfast.   lisinopril  (ZESTRIL ) 2.5 MG tablet Take 1 tablet (2.5 mg total) by mouth daily.   Multiple Vitamin (MULTIVITAMIN) tablet Take 1 tablet by mouth daily.   nadolol  (CORGARD ) 20 MG tablet Take 1 tablet (20 mg total) by mouth daily.   trospium  (SANCTURA ) 20 MG tablet Take 1 tablet (20 mg total) by mouth daily. Take in the morning.   Vibegron  (GEMTESA ) 75 MG TABS Take 1 tablet (75 mg total) by mouth daily. Take in the evening.   warfarin (COUMADIN ) 5 MG tablet Take 0.5 tablets (2.5 mg total) by mouth daily with supper.   [DISCONTINUED] furosemide  (LASIX ) 20 MG tablet Take 1 tablet (20 mg total) by mouth daily.   [DISCONTINUED] warfarin (COUMADIN ) 5 MG tablet Take 5  mg by mouth on Saturday and Sunday, and take 2.5 mg by mouth daily on Monday, Tuesday, Wednesday, Thursday and Friday.   No facility-administered encounter medications on file as of 12/29/2023.    Surgical History: Past Surgical History:  Procedure Laterality Date   APPENDECTOMY     BOWEL RESECTION  10/12/2014   Procedure: SMALL BOWEL RESECTION;  Surgeon: Dorothyann LITTIE Husk, MD;  Location: [facility] ORS;  Service: General;;   CHOLECYSTECTOMY     COLONOSCOPY     INSERT / REPLACE / REMOVE PACEMAKER  09/13/2008   SSS s/p pacemaker implant; [employer] pacemaker,.   LAPAROTOMY N/A 10/12/2014   Procedure: EXPLORATORY LAPAROTOMY;  Surgeon: Dorothyann LITTIE Husk, MD;  Location: [facility] ORS;  Service: General;  Laterality: N/A;   PACEMAKER INSERTION N/A 12/30/2017   Procedure: GENERATOR CHANGE OUT- DUAL CHAMBER;  Surgeon: Britta King, MD;  Location: [facility] ORS;  Service: Cardiovascular;  Laterality: N/A;   PROSTATE SURGERY      Medical History: Past Medical History:  Diagnosis Date   A-fib (HCC)    CHF (congestive heart failure) (HCC)    Claudication    leg   Hyperlipidemia    Peripheral vascular disease    Presence of permanent cardiac pacemaker    Small bowel obstruction (HCC)    Thyroid  disease     Family History: Family History  Family history unknown: Yes    Social History   Socioeconomic History   Marital status: Widowed    Spouse name: Not on file   Number of children: Not on file   Years of education: Not on file   Highest education level: Not on file  Occupational History   Not on file  Tobacco Use   Smoking status: Never    Passive exposure: Never   Smokeless tobacco: Never  Vaping Use   Vaping status: Never Used  Substance and Sexual Activity   Alcohol use: No    Comment: occasional   Drug use: No   Sexual activity: Yes    Birth control/protection: None  Other Topics Concern   Not on file  Social History Narrative   Not on file   Social Drivers of Health    Financial Resource Strain: Not on file  Food Insecurity: Not on file  Transportation Needs: Not on file  Physical Activity: Not on file  Stress: Not on file  Social Connections: Not on file  Intimate Partner Violence: Not on file      Review of Systems  Constitutional:  Positive for appetite change and fatigue. Negative for activity change, chills, fever and unexpected weight change.  HENT: Negative.  Negative for congestion, ear pain, rhinorrhea, sore throat  and trouble swallowing.   Eyes: Negative.   Respiratory: Negative.  Negative for cough, chest tightness, shortness of breath and wheezing.   Cardiovascular: Negative.  Negative for chest pain.  Gastrointestinal: Negative.  Negative for abdominal pain, blood in stool, constipation, diarrhea, nausea and vomiting.  Endocrine: Negative.   Genitourinary: Negative.  Negative for difficulty urinating, dysuria, frequency, hematuria and urgency.  Musculoskeletal: Negative.  Negative for arthralgias, back pain, joint swelling, myalgias and neck pain.  Skin: Negative.  Negative for rash and wound.  Allergic/Immunologic: Negative.  Negative for immunocompromised state.  Neurological:  Positive for weakness. Negative for dizziness, seizures, numbness and headaches.  Hematological: Negative.   Psychiatric/Behavioral: Negative.  Negative for behavioral problems, self-injury and suicidal ideas. The patient is not nervous/anxious.     Vital Signs: BP 138/80   Pulse 72   Temp (!) 95.8 F (35.4 C)   Resp 16   Ht 6' 4 (1.93 m)   Wt 184 lb 12.8 oz (83.8 kg)   SpO2 98%   BMI 22.49 kg/m    Physical Exam Vitals reviewed.  Constitutional:      General: He is not in acute distress.    Appearance: Normal appearance. He is well-developed and normal weight. He is not ill-appearing or diaphoretic.  HENT:     Head: Normocephalic and atraumatic.     Right Ear: Tympanic membrane, ear canal and external ear normal.     Left Ear: Tympanic  membrane, ear canal and external ear normal.     Nose: Nose normal. No congestion or rhinorrhea.     Mouth/Throat:     Mouth: Mucous membranes are moist.     Pharynx: Oropharynx is clear. No oropharyngeal exudate or posterior oropharyngeal erythema.  Eyes:     Extraocular Movements: Extraocular movements intact.     Conjunctiva/sclera: Conjunctivae normal.     Pupils: Pupils are equal, round, and reactive to light.  Neck:     Thyroid : No thyromegaly.     Vascular: No JVD.     Trachea: No tracheal deviation.  Cardiovascular:     Rate and Rhythm: Normal rate and regular rhythm.     Pulses: Normal pulses.     Heart sounds: Normal heart sounds. No murmur heard.    No friction rub. No gallop.  Pulmonary:     Effort: Pulmonary effort is normal. No respiratory distress.     Breath sounds: Normal breath sounds. No wheezing or rales.  Chest:     Chest wall: No tenderness.  Abdominal:     General: Bowel sounds are normal.     Palpations: Abdomen is soft.  Musculoskeletal:        General: Normal range of motion.     Cervical back: Normal range of motion and neck supple.     Right lower leg: No edema.     Left lower leg: No edema.  Lymphadenopathy:     Cervical: No cervical adenopathy.  Skin:    General: Skin is warm and dry.     Capillary Refill: Capillary refill takes less than 2 seconds.     Coloration: Skin is not jaundiced or pale.     Findings: No bruising, erythema, lesion or rash.  Neurological:     Mental Status: He is alert and oriented to person, place, and time.     Cranial Nerves: No cranial nerve deficit.     Coordination: Coordination normal.     Gait: Gait normal.  Psychiatric:  Mood and Affect: Mood normal.        Behavior: Behavior normal.        Thought Content: Thought content normal.        Judgment: Judgment normal.        Assessment/Plan: 1. Encounter for Medicare annual examination with abnormal findings (Primary) .Age-appropriate preventive  screenings and vaccinations discussed. Routine labs for health maintenance are up to date, done in June this year. PHM updated.    2. Longstanding persistent atrial fibrillation (HCC) Continue warfarin 2.5 mg daily and repeat INR in late December.  - warfarin (COUMADIN ) 5 MG tablet; Take 0.5 tablets (2.5 mg total) by mouth daily with supper.  Dispense: 45 tablet; Refill: 1  3. Essential hypertension Continue nadolol , furosemide  and lisinopril  as prescribed.  - furosemide  (LASIX ) 20 MG tablet; Take 1 tablet (20 mg total) by mouth daily.  Dispense: 30 tablet; Refill: 3  4. Stage 3b chronic kidney disease (HCC) Continue low dose lisinopril  as prescribed.   5. Hypothyroidism due to Hashimoto thyroiditis Continue levothyroxine  as prescribed.   6. Benign prostatic hyperplasia with urinary obstruction Followed by urology. Continue trospium  and gemtesa  as prescribed   7. Mixed hyperlipidemia Continue atorvastatin  as prescribed.   8. Need for vaccination - pneumococcal 20-valent conjugate vaccine (PREVNAR 20) 0.5 ML injection; Inject 0.5 mLs into the muscle once as needed for up to 1 dose for immunization.  Dispense: 0.5 mL; Refill: 0  9. Presence of permanent cardiac pacemaker Followed by cardiology who monitors his pacemaker    General Counseling: Johnny Norman verbalizes understanding of the findings of todays visit and agrees with plan of treatment. I have discussed any further diagnostic evaluation that may be needed or ordered today. We also reviewed his medications today. he has been encouraged to call the office with any questions or concerns that should arise related to todays visit.    No orders of the defined types were placed in this encounter.   Meds ordered this encounter  Medications   pneumococcal 20-valent conjugate vaccine (PREVNAR 20) 0.5 ML injection    Sig: Inject 0.5 mLs into the muscle once as needed for up to 1 dose for immunization.    Dispense:  0.5 mL    Refill:   0    Due for prevnar 20   furosemide  (LASIX ) 20 MG tablet    Sig: Take 1 tablet (20 mg total) by mouth daily.    Dispense:  30 tablet    Refill:  3   warfarin (COUMADIN ) 5 MG tablet    Sig: Take 0.5 tablets (2.5 mg total) by mouth daily with supper.    Dispense:  45 tablet    Refill:  1    Return in about 3 months (around 03/28/2024) for  , F/U, Shawon Denzer PCP and keep INR check nurse visit schedule in late december. .   Total time spent:30 Minutes Time spent includes review of chart, medications, test results, and follow up plan with the patient.   [place] Controlled Substance Database was reviewed by me.  This patient was seen by Mardy Maxin, FNP-C in collaboration with Dr. Sigrid Bathe as a part of collaborative care agreement.  Caley Ciaramitaro R. Maxin, MSN, FNP-C Internal medicine

## 2024-01-12 ENCOUNTER — Ambulatory Visit: Admitting: Podiatry

## 2024-01-12 DIAGNOSIS — L600 Ingrowing nail: Secondary | ICD-10-CM

## 2024-01-12 NOTE — Patient Instructions (Signed)
 Long Term Care Instructions-Post Nail Surgery  You have had your ingrown toenail and root treated with a chemical.  This chemical causes a burn that will drain and ooze like a blister.  This can drain for 6-8 weeks or longer.  It is important to keep this area clean, covered, and follow the soaking instructions dispensed at the time of your surgery.  This area will eventually dry and form a scab.  Once the scab forms you no longer need to soak or apply a dressing.  If at any time you experience an increase in pain, redness, swelling, or drainage, you should contact the office as soon as possible.  Place 1/4 cup of epsom salts in a quart of warm tap water.  Submerge your foot or feet in the solution and soak for 10 minutes.  This soak should be done twice a day.  Next, remove your foot or feet from solution, blot dry the affected area. Apply ointment and cover if instructed by your doctor.   IF YOUR SKIN BECOMES IRRITATED WHILE USING THESE INSTRUCTIONS, IT IS OKAY TO SWITCH TO  WHITE VINEGAR AND WATER.  As another alternative soak, you may use antibacterial soap and water.  Monitor for any signs/symptoms of infection. Call the office immediately if any occur or go directly to the emergency room. Call with any questions/concerns.

## 2024-01-12 NOTE — Progress Notes (Signed)
 Subjective:  Patient ID: Johnny Norman, male    DOB: 1925-06-17,  MRN: [id number]  Chief Complaint  Patient presents with   Nail Problem    89 y.o. male presents with the above complaint.  Patient presents for right hallux medial border ingrown painful to touch is progressive gotten worse worse with ambulation or shoe pressure he would like to have it removed has not seen anyone else prior to seeing me denies any other acute complaints pain scale 7 out of 10 dull aching nature.   Review of Systems: Negative except as noted in the HPI. Denies N/V/F/Ch.  Past Medical History:  Diagnosis Date   A-fib [facility])    CHF (congestive heart failure) (HCC)    Claudication    leg   Hyperlipidemia    Peripheral vascular disease    Presence of permanent cardiac pacemaker    Small bowel obstruction (HCC)    Thyroid  disease    Current Medications[1]  Tobacco Use History[2]  Allergies[3] Objective:  There were no vitals filed for this visit. There is no height or weight on file to calculate BMI. Constitutional Well developed. Well nourished.  Vascular Dorsalis pedis pulses palpable bilaterally. Posterior tibial pulses palpable bilaterally. Capillary refill normal to all digits.  No cyanosis or clubbing noted. Pedal hair growth normal.  Neurologic Normal speech. Oriented to person, place, and time. Epicritic sensation to light touch grossly present bilaterally.  Dermatologic Painful ingrowing nail at medial nail borders of the hallux nail right. No other open wounds. No skin lesions.  Orthopedic: Normal joint ROM without pain or crepitus bilaterally. No visible deformities. No bony tenderness.   Radiographs: None Assessment:   1. Ingrown toenail of right foot    Plan:  Patient was evaluated and treated and all questions answered.  Ingrown Nail, right -Patient elects to proceed with minor surgery to remove ingrown toenail removal today. Consent reviewed and signed by  patient. -Ingrown nail excised. See procedure note. -Educated on post-procedure care including soaking. Written instructions provided and reviewed. -Patient to follow up in 2 weeks for nail check.  Procedure: Excision of Ingrown Toenail Location: Right 1st toe medial nail borders. Anesthesia: Lidocaine  1% plain; 1.5 mL and Marcaine 0.5% plain; 1.5 mL, digital block. Skin Prep: Betadine. Dressing: Silvadene; telfa; dry, sterile, compression dressing. Technique: Following skin prep, the toe was exsanguinated and a tourniquet was secured at the base of the toe. The affected nail border was freed, split with a nail splitter, and excised. Chemical matrixectomy was then performed with phenol and irrigated out with alcohol. The tourniquet was then removed and sterile dressing applied. Disposition: Patient tolerated procedure well. Patient to return in 2 weeks for follow-up.   No follow-ups on file.    [1]  Current Outpatient Medications:    atorvastatin  (LIPITOR) 10 MG tablet, Take 1 tablet (10 mg total) by mouth daily., Disp: 90 tablet, Rfl: 3   Blood Pressure Monitor KIT, 1 each by Does not apply route daily., Disp: 1 kit, Rfl: 0   furosemide  (LASIX ) 20 MG tablet, Take 1 tablet (20 mg total) by mouth daily., Disp: 30 tablet, Rfl: 3   levothyroxine  (SYNTHROID ) 88 MCG tablet, Take 1 tablet (88 mcg total) by mouth daily before breakfast., Disp: 90 tablet, Rfl: 3   lisinopril  (ZESTRIL ) 2.5 MG tablet, Take 1 tablet (2.5 mg total) by mouth daily., Disp: 90 tablet, Rfl: 1   Multiple Vitamin (MULTIVITAMIN) tablet, Take 1 tablet by mouth daily., Disp: , Rfl:    nadolol  (  CORGARD ) 20 MG tablet, Take 1 tablet (20 mg total) by mouth daily., Disp: 90 tablet, Rfl: 3   pneumococcal 20-valent conjugate vaccine (PREVNAR 20) 0.5 ML injection, Inject 0.5 mLs into the muscle once as needed for up to 1 dose for immunization., Disp: 0.5 mL, Rfl: 0   trospium  (SANCTURA ) 20 MG tablet, Take 1 tablet (20 mg total) by  mouth daily. Take in the morning., Disp: 30 tablet, Rfl: 11   Vibegron  (GEMTESA ) 75 MG TABS, Take 1 tablet (75 mg total) by mouth daily. Take in the evening., Disp: 30 tablet, Rfl: 11   warfarin (COUMADIN ) 5 MG tablet, Take 0.5 tablets (2.5 mg total) by mouth daily with supper., Disp: 45 tablet, Rfl: 1 [2]  Social History Tobacco Use  Smoking Status Never   Passive exposure: Never  Smokeless Tobacco Never  [3] No Known Allergies

## 2024-01-19 ENCOUNTER — Ambulatory Visit

## 2024-01-29 ENCOUNTER — Ambulatory Visit: Payer: Self-pay | Admitting: Physician Assistant

## 2024-02-04 ENCOUNTER — Ambulatory Visit: Admitting: Podiatry

## 2024-02-04 ENCOUNTER — Encounter: Payer: Self-pay | Admitting: Podiatry

## 2024-02-04 DIAGNOSIS — B351 Tinea unguium: Secondary | ICD-10-CM

## 2024-02-04 DIAGNOSIS — M79674 Pain in right toe(s): Secondary | ICD-10-CM | POA: Diagnosis not present

## 2024-02-04 DIAGNOSIS — M79675 Pain in left toe(s): Secondary | ICD-10-CM

## 2024-02-08 ENCOUNTER — Ambulatory Visit: Admitting: Physician Assistant

## 2024-02-09 ENCOUNTER — Ambulatory Visit (INDEPENDENT_AMBULATORY_CARE_PROVIDER_SITE_OTHER): Admitting: Nurse Practitioner

## 2024-02-09 DIAGNOSIS — I4811 Longstanding persistent atrial fibrillation: Secondary | ICD-10-CM | POA: Diagnosis not present

## 2024-02-09 LAB — POCT INR
INR: 2.1 (ref 2.0–3.0)
Prothrombin Time: 24.7

## 2024-02-09 NOTE — Progress Notes (Signed)
 Pt was here today for INR check his INR 2.1 and Prothrombin time 24.7 as per alyssa advised son to continue same med coumadin  2.5 mg daily and follow in 1 month

## 2024-02-10 ENCOUNTER — Encounter

## 2024-02-10 DIAGNOSIS — I495 Sick sinus syndrome: Secondary | ICD-10-CM

## 2024-02-10 LAB — CUP PACEART REMOTE DEVICE CHECK
Battery Remaining Longevity: 85 mo
Battery Voltage: 2.98 V
Brady Statistic AP VP Percent: 0 %
Brady Statistic AP VS Percent: 0 %
Brady Statistic AS VP Percent: 99.95 %
Brady Statistic AS VS Percent: 0.05 %
Brady Statistic RA Percent Paced: 0 %
Brady Statistic RV Percent Paced: 99.95 %
Date Time Interrogation Session: 20260114033006
Implantable Lead Connection Status: 753985
Implantable Lead Connection Status: 753985
Implantable Lead Implant Date: 20191204
Implantable Lead Implant Date: 20191204
Implantable Lead Location: 753859
Implantable Lead Location: 753860
Implantable Lead Model: 5092
Implantable Lead Model: 5092
Implantable Pulse Generator Implant Date: 20191204
Lead Channel Impedance Value: 323 Ohm
Lead Channel Impedance Value: 418 Ohm
Lead Channel Impedance Value: 456 Ohm
Lead Channel Impedance Value: 513 Ohm
Lead Channel Pacing Threshold Amplitude: 0.5 V
Lead Channel Pacing Threshold Pulse Width: 0.4 ms
Lead Channel Sensing Intrinsic Amplitude: 14.25 mV
Lead Channel Sensing Intrinsic Amplitude: 14.25 mV
Lead Channel Setting Pacing Amplitude: 2 V
Lead Channel Setting Pacing Pulse Width: 0.4 ms
Lead Channel Setting Sensing Sensitivity: 2 mV
Zone Setting Status: 755011

## 2024-02-10 NOTE — Telephone Encounter (Signed)
Transmission received.

## 2024-02-11 NOTE — Progress Notes (Signed)
"  °  Subjective:  Patient ID: Johnny Norman, male    DOB: 12/06/1925,  MRN: [id number]  89 y.o. male presents painful mycotic toenails of both feet that are difficult to trim. Pain interferes with daily activities and wearing enclosed shoe gear comfortably. He is accompanied by his son on today's visit.  Chief Complaint  Patient presents with   Nail Problem    Thick painful toenails, 3 month follow up - patient takes Coumadin     New problem(s): None   PCP is Liana Fish, NP.  No Known Allergies  Review of Systems: Negative except as noted in the HPI.   Objective:  Johnny Norman is a pleasant 89 y.o. male in NAD. AAO x 3.  Vascular Examination: Capillary refill time immediate b/l. Palpable DP pulses b/l. Diminished PT pulses b/l. Pedal hair absent b/l. Pedal edema +1 left foot. No pain with calf compression b/l. Skin temperature gradient WNL b/l. No cyanosis or clubbing b/l. No ischemia or gangrene noted b/l.   Neurological Examination: Sensation grossly intact b/l with 10 gram monofilament. Vibratory sensation intact b/l.   Dermatological Examination:  Evidence of partial matrixectomy medial border of right great toe.  Pedal skin with normal turgor, texture and tone b/l.  No open wounds. No interdigital macerations.   Toenails 1-5 b/l thick, discolored, elongated with subungual debris and pain on dorsal palpation.   No corns, calluses, nor porokeratotic lesions.  Musculoskeletal Examination: Muscle strength 5/5 to all lower extremity muscle groups bilaterally. No pain, crepitus or joint limitation noted with ROM bilateral LE. Pes planus deformity noted right lower extremity. Pes planovalgus deformity noted left lower extremity. Utilizes cane for ambulation assistance.  Radiographs: None  Assessment:   1. Pain due to onychomycosis of toenails of both feet    Plan:  -Consent given for treatment as described below: -Examined patient. -Patient to  continue soft, supportive shoe gear daily. -Mycotic toenails 1-5 bilaterally were debrided in length and girth with sterile nail nippers and dremel. Pinpoint bleeding of left great toe addressed with Lumicain Hemostatic Solution, cleansed with alcohol. Triple antibiotic ointment applied. No further treatment required by patient/caregiver. -Patient/POA to call should there be question/concern in the interim.  Return in about 3 months (around 05/04/2024).  Johnny Norman, DPM       LOCATION: [address] [place] [address]                   Office [phone]   [facility] LOCATION: [address] [place], [place] [address] Office [phone]  "

## 2024-02-12 ENCOUNTER — Ambulatory Visit: Admitting: Physician Assistant

## 2024-02-12 VITALS — BP 122/82 | HR 80 | Ht 76.0 in | Wt 183.0 lb

## 2024-02-12 DIAGNOSIS — R399 Unspecified symptoms and signs involving the genitourinary system: Secondary | ICD-10-CM

## 2024-02-12 DIAGNOSIS — N3281 Overactive bladder: Secondary | ICD-10-CM

## 2024-02-12 LAB — BLADDER SCAN AMB NON-IMAGING: Scan Result: 131

## 2024-02-12 MED ORDER — GEMTESA 75 MG PO TABS: 75.0000 mg | ORAL_TABLET | Freq: Every day | ORAL | 3 refills | Status: AC

## 2024-02-12 MED ORDER — FINASTERIDE 5 MG PO TABS: 5.0000 mg | ORAL_TABLET | Freq: Every day | ORAL | 3 refills | Status: AC

## 2024-02-12 MED ORDER — TROSPIUM CHLORIDE 20 MG PO TABS: 20.0000 mg | ORAL_TABLET | Freq: Every day | ORAL | 3 refills | Status: AC

## 2024-02-12 NOTE — Progress Notes (Signed)
 "  02/12/2024 10:52 AM   Johnny Norman 1925/11/09 [id number]  CC: Chief Complaint  Patient presents with   Follow-up   HPI: Johnny Norman is a 89 y.o. male with PMH BPH not on pharmacotherapy and OAB wet with nocturia on Gemtesa  and trospium  who presents today for annual follow-up.  He is accompanied today by his son, Raybon, who contributes to HPI.  Today he reports he remains on p.m. Gemtesa  and a.m. trospium  IR 20 mg and overall his symptoms are stable and well-controlled on this.  Today he mentions some chronic urinary intermittency and overnight dribbling.  He wonders why these are happening and if there is anything we can do to fix these things.  PVR , previously 117 mL.  PMH: Past Medical History:  Diagnosis Date   A-fib (HCC)    CHF (congestive heart failure) (HCC)    Claudication    leg   Hyperlipidemia    Peripheral vascular disease    Presence of permanent cardiac pacemaker    Small bowel obstruction (HCC)    Thyroid  disease     Surgical History: Past Surgical History:  Procedure Laterality Date   APPENDECTOMY     BOWEL RESECTION  10/12/2014   Procedure: SMALL BOWEL RESECTION;  Surgeon: Dorothyann LITTIE Husk, MD;  Location: [facility];  Service: General;;   CHOLECYSTECTOMY     COLONOSCOPY     INSERT / REPLACE / REMOVE PACEMAKER  09/13/2008   SSS s/p pacemaker implant; [employer] pacemaker,.   LAPAROTOMY N/A 10/12/2014   Procedure: EXPLORATORY LAPAROTOMY;  Surgeon: Dorothyann LITTIE Husk, MD;  Location: [facility];  Service: General;  Laterality: N/A;   PACEMAKER INSERTION N/A 12/30/2017   Procedure: GENERATOR CHANGE OUT- DUAL CHAMBER;  Surgeon: Britta King, MD;  Location: [facility];  Service: Cardiovascular;  Laterality: N/A;   PROSTATE SURGERY      Home Medications:  Allergies as of 02/12/2024   No Known Allergies      Medication List        Accurate as of February 12, 2024 10:52 AM. If you have any questions, ask your nurse or doctor.           aspirin  EC 81 MG tablet Take 81 mg by mouth.   atorvastatin  10 MG tablet Commonly known as: LIPITOR Take 1 tablet (10 mg total) by mouth daily.   Blood Pressure Monitor Kit 1 each by Does not apply route daily.   finasteride  5 MG tablet Commonly known as: PROSCAR  Take 1 tablet (5 mg total) by mouth daily.   furosemide  20 MG tablet Commonly known as: LASIX  Take 1 tablet (20 mg total) by mouth daily.   Gemtesa  75 MG Tabs Generic drug: Vibegron  Take 1 tablet (75 mg total) by mouth daily. Take in the evening.   levothyroxine  88 MCG tablet Commonly known as: SYNTHROID  Take 1 tablet (88 mcg total) by mouth daily before breakfast.   lisinopril  2.5 MG tablet Commonly known as: ZESTRIL  Take 1 tablet (2.5 mg total) by mouth daily.   multivitamin tablet Take 1 tablet by mouth daily.   nadolol  20 MG tablet Commonly known as: CORGARD  Take 1 tablet (20 mg total) by mouth daily.   pneumococcal 20-valent conjugate vaccine 0.5 ML injection Commonly known as: PREVNAR 20 Inject 0.5 mLs into the muscle once as needed for up to 1 dose for immunization.   trospium  20 MG tablet Commonly known as: SANCTURA  Take 1 tablet (20 mg total) by mouth daily. Take in the morning.  Vitamin D-1000 Max St 25 MCG (1000 UT) tablet Generic drug: Cholecalciferol Take by mouth.   warfarin 5 MG tablet Commonly known as: COUMADIN  Take 0.5 tablets (2.5 mg total) by mouth daily with supper.        Allergies:  Allergies[1]  Family History: Family History  Family history unknown: Yes    Social History:   reports that he has never smoked. He has never been exposed to tobacco smoke. He has never used smokeless tobacco. He reports that he does not drink alcohol and does not use drugs.  Physical Exam: BP 122/82   Pulse 80   Ht 6' 4 (1.93 m)   Wt 183 lb (83 kg)   BMI 22.28 kg/m   Constitutional:  Alert and oriented, no acute distress, nontoxic appearing HEENT: [place],  AT Cardiovascular: No clubbing, cyanosis, or edema Respiratory: Normal respiratory effort, no increased work of breathing Skin: No rashes, bruises or suspicious lesions Neurologic: Grossly intact, no focal deficits, moving all 4 extremities Psychiatric: Normal mood and affect  Laboratory Data: Results for orders placed or performed in visit on 02/12/24  Bladder Scan (Post Void Residual) in office   Collection Time: 02/12/24 10:37 AM  Result Value Ref Range   Scan Result 131    Assessment & Plan:   1. OAB (overactive bladder) (Primary) Emptying appropriately.  Symptoms well-controlled on Gemtesa  and trospium , will continue this. - Bladder Scan (Post Void Residual) in office - Vibegron  (GEMTESA ) 75 MG TABS; Take 1 tablet (75 mg total) by mouth daily. Take in the evening.  Dispense: 90 tablet; Refill: 3 - trospium  (SANCTURA ) 20 MG tablet; Take 1 tablet (20 mg total) by mouth daily. Take in the morning.  Dispense: 90 tablet; Refill: 3  2. Lower urinary tract symptoms (LUTS) Chronic intermittency and overnight dribbling with known prostatomegaly on prior imaging.  I hesitate to start him on Flomax due to risk for falls at his age.  Will start finasteride  as an alternative.  We discussed common side effects including gynecomastia and ED.  We discussed that I anticipate this will cause gradual changes in his voiding symptoms over the next several months. - finasteride  (PROSCAR ) 5 MG tablet; Take 1 tablet (5 mg total) by mouth daily.  Dispense: 90 tablet; Refill: 3   Return in about 1 year (around 02/11/2025) for Annual follow up with [facility].  Lucie Hones, PA-C  [facility] [address], [address] [place], [place] [address] [phone]     [1] No Known Allergies  "

## 2024-02-14 ENCOUNTER — Ambulatory Visit: Payer: Self-pay | Admitting: Cardiology

## 2024-02-17 NOTE — Progress Notes (Signed)
 Remote PPM Transmission

## 2024-03-11 ENCOUNTER — Ambulatory Visit

## 2024-03-29 ENCOUNTER — Ambulatory Visit: Admitting: Nurse Practitioner

## 2024-05-06 ENCOUNTER — Ambulatory Visit: Admitting: Podiatry

## 2024-05-11 ENCOUNTER — Encounter

## 2024-08-10 ENCOUNTER — Encounter

## 2024-11-09 ENCOUNTER — Encounter

## 2024-12-29 ENCOUNTER — Ambulatory Visit: Admitting: Nurse Practitioner

## 2025-02-08 ENCOUNTER — Encounter

## 2025-02-09 ENCOUNTER — Ambulatory Visit: Admitting: Physician Assistant

## 2025-05-10 ENCOUNTER — Encounter

## 2025-08-09 ENCOUNTER — Encounter
